# Patient Record
Sex: Female | Born: 1955
Health system: Southern US, Community
[De-identification: ages and names within clinical notes are randomized; demographics above are authoritative.]

## PROBLEM LIST (undated history)

## (undated) DIAGNOSIS — R002 Palpitations: Secondary | ICD-10-CM

## (undated) DIAGNOSIS — K449 Diaphragmatic hernia without obstruction or gangrene: Secondary | ICD-10-CM

## (undated) DIAGNOSIS — K219 Gastro-esophageal reflux disease without esophagitis: Secondary | ICD-10-CM

## (undated) DIAGNOSIS — A048 Other specified bacterial intestinal infections: Secondary | ICD-10-CM

## (undated) DIAGNOSIS — K224 Dyskinesia of esophagus: Secondary | ICD-10-CM

## (undated) DIAGNOSIS — K2950 Unspecified chronic gastritis without bleeding: Secondary | ICD-10-CM

## (undated) DIAGNOSIS — E042 Nontoxic multinodular goiter: Secondary | ICD-10-CM

## (undated) DIAGNOSIS — C649 Malignant neoplasm of unspecified kidney, except renal pelvis: Secondary | ICD-10-CM

## (undated) DIAGNOSIS — E041 Nontoxic single thyroid nodule: Secondary | ICD-10-CM

## (undated) DIAGNOSIS — M7580 Other shoulder lesions, unspecified shoulder: Secondary | ICD-10-CM

## (undated) DIAGNOSIS — M1990 Unspecified osteoarthritis, unspecified site: Secondary | ICD-10-CM

## (undated) DIAGNOSIS — IMO0002 Reserved for concepts with insufficient information to code with codable children: Secondary | ICD-10-CM

## (undated) DIAGNOSIS — S92502P Displaced unspecified fracture of left lesser toe(s), subsequent encounter for fracture with malunion: Secondary | ICD-10-CM

## (undated) DIAGNOSIS — I10 Essential (primary) hypertension: Secondary | ICD-10-CM

## (undated) DIAGNOSIS — E0590 Thyrotoxicosis, unspecified without thyrotoxic crisis or storm: Secondary | ICD-10-CM

## (undated) DIAGNOSIS — C801 Malignant (primary) neoplasm, unspecified: Secondary | ICD-10-CM

## (undated) DIAGNOSIS — M5030 Other cervical disc degeneration, unspecified cervical region: Secondary | ICD-10-CM

## (undated) HISTORY — DX: Other specified bacterial intestinal infections: A04.8

## (undated) HISTORY — DX: Essential (primary) hypertension: I10

## (undated) HISTORY — PX: TRANSTHORACIC ECHOCARDIOGRAM: SHX275

## (undated) HISTORY — PX: OTHER SURGICAL HISTORY: SHX169

## (undated) HISTORY — DX: Reserved for concepts with insufficient information to code with codable children: IMO0002

## (undated) HISTORY — DX: Thyrotoxicosis, unspecified without thyrotoxic crisis or storm: E05.90

## (undated) HISTORY — DX: Nontoxic single thyroid nodule: E04.1

## (undated) HISTORY — DX: Gastro-esophageal reflux disease without esophagitis: K21.9

## (undated) HISTORY — DX: Other shoulder lesions, unspecified shoulder: M75.80

## (undated) HISTORY — DX: Malignant (primary) neoplasm, unspecified: C80.1

## (undated) HISTORY — PX: TUBAL LIGATION: SHX77

## (undated) HISTORY — DX: Diaphragmatic hernia without obstruction or gangrene: K44.9

## (undated) HISTORY — DX: Malignant neoplasm of unspecified kidney, except renal pelvis: C64.9

## (undated) HISTORY — DX: Unspecified chronic gastritis without bleeding: K29.50

## (undated) HISTORY — DX: Unspecified osteoarthritis, unspecified site: M19.90

## (undated) HISTORY — DX: Nontoxic multinodular goiter: E04.2

## (undated) HISTORY — DX: Dyskinesia of esophagus: K22.4

---

## 1998-12-08 ENCOUNTER — Encounter: Payer: Self-pay | Admitting: *Deleted

## 1998-12-08 ENCOUNTER — Ambulatory Visit (HOSPITAL_COMMUNITY): Admission: RE | Admit: 1998-12-08 | Discharge: 1998-12-08 | Payer: Self-pay | Admitting: *Deleted

## 1999-12-30 ENCOUNTER — Encounter: Payer: Self-pay | Admitting: *Deleted

## 1999-12-30 ENCOUNTER — Ambulatory Visit (HOSPITAL_COMMUNITY): Admission: RE | Admit: 1999-12-30 | Discharge: 1999-12-30 | Payer: Self-pay | Admitting: *Deleted

## 2000-10-04 ENCOUNTER — Other Ambulatory Visit: Admission: RE | Admit: 2000-10-04 | Discharge: 2000-10-04 | Payer: Self-pay | Admitting: Gynecology

## 2001-11-08 ENCOUNTER — Other Ambulatory Visit: Admission: RE | Admit: 2001-11-08 | Discharge: 2001-11-08 | Payer: Self-pay | Admitting: Gynecology

## 2001-12-06 ENCOUNTER — Encounter: Payer: Self-pay | Admitting: Obstetrics and Gynecology

## 2001-12-06 ENCOUNTER — Ambulatory Visit (HOSPITAL_COMMUNITY): Admission: RE | Admit: 2001-12-06 | Discharge: 2001-12-06 | Payer: Self-pay | Admitting: Obstetrics and Gynecology

## 2003-08-05 ENCOUNTER — Encounter: Payer: Self-pay | Admitting: Obstetrics and Gynecology

## 2003-08-05 ENCOUNTER — Ambulatory Visit (HOSPITAL_COMMUNITY): Admission: RE | Admit: 2003-08-05 | Discharge: 2003-08-05 | Payer: Self-pay | Admitting: Obstetrics and Gynecology

## 2003-08-07 ENCOUNTER — Encounter: Payer: Self-pay | Admitting: Obstetrics and Gynecology

## 2003-08-07 ENCOUNTER — Encounter: Admission: RE | Admit: 2003-08-07 | Discharge: 2003-08-07 | Payer: Self-pay | Admitting: Obstetrics and Gynecology

## 2003-08-18 ENCOUNTER — Other Ambulatory Visit: Admission: RE | Admit: 2003-08-18 | Discharge: 2003-08-18 | Payer: Self-pay | Admitting: Gynecology

## 2003-12-20 HISTORY — PX: NEPHRECTOMY: SHX65

## 2004-01-05 ENCOUNTER — Ambulatory Visit (HOSPITAL_COMMUNITY): Admission: RE | Admit: 2004-01-05 | Discharge: 2004-01-05 | Payer: Self-pay | Admitting: Gastroenterology

## 2004-01-08 ENCOUNTER — Ambulatory Visit (HOSPITAL_COMMUNITY): Admission: RE | Admit: 2004-01-08 | Discharge: 2004-01-08 | Payer: Self-pay | Admitting: Gastroenterology

## 2004-01-20 ENCOUNTER — Ambulatory Visit (HOSPITAL_COMMUNITY): Admission: RE | Admit: 2004-01-20 | Discharge: 2004-01-20 | Payer: Self-pay | Admitting: Gastroenterology

## 2004-01-27 ENCOUNTER — Ambulatory Visit (HOSPITAL_COMMUNITY): Admission: RE | Admit: 2004-01-27 | Discharge: 2004-01-27 | Payer: Self-pay | Admitting: Gastroenterology

## 2004-01-28 ENCOUNTER — Ambulatory Visit (HOSPITAL_COMMUNITY): Admission: RE | Admit: 2004-01-28 | Discharge: 2004-01-28 | Payer: Self-pay | Admitting: *Deleted

## 2004-09-07 ENCOUNTER — Ambulatory Visit (HOSPITAL_COMMUNITY): Admission: RE | Admit: 2004-09-07 | Discharge: 2004-09-07 | Payer: Self-pay | Admitting: Obstetrics and Gynecology

## 2004-09-07 ENCOUNTER — Other Ambulatory Visit: Admission: RE | Admit: 2004-09-07 | Discharge: 2004-09-07 | Payer: Self-pay | Admitting: Gynecology

## 2005-09-19 ENCOUNTER — Other Ambulatory Visit: Admission: RE | Admit: 2005-09-19 | Discharge: 2005-09-19 | Payer: Self-pay | Admitting: Gynecology

## 2005-11-01 ENCOUNTER — Ambulatory Visit (HOSPITAL_COMMUNITY): Admission: RE | Admit: 2005-11-01 | Discharge: 2005-11-01 | Payer: Self-pay | Admitting: Gynecology

## 2006-11-06 ENCOUNTER — Ambulatory Visit (HOSPITAL_COMMUNITY): Admission: RE | Admit: 2006-11-06 | Discharge: 2006-11-06 | Payer: Self-pay | Admitting: Gynecology

## 2006-11-17 ENCOUNTER — Other Ambulatory Visit: Admission: RE | Admit: 2006-11-17 | Discharge: 2006-11-17 | Payer: Self-pay | Admitting: Gynecology

## 2007-11-16 ENCOUNTER — Ambulatory Visit (HOSPITAL_COMMUNITY): Admission: RE | Admit: 2007-11-16 | Discharge: 2007-11-16 | Payer: Self-pay | Admitting: Gynecology

## 2007-11-19 DIAGNOSIS — E559 Vitamin D deficiency, unspecified: Secondary | ICD-10-CM | POA: Insufficient documentation

## 2007-11-23 ENCOUNTER — Other Ambulatory Visit: Admission: RE | Admit: 2007-11-23 | Discharge: 2007-11-23 | Payer: Self-pay | Admitting: Gynecology

## 2008-12-01 ENCOUNTER — Ambulatory Visit (HOSPITAL_COMMUNITY): Admission: RE | Admit: 2008-12-01 | Discharge: 2008-12-01 | Payer: Self-pay | Admitting: Gynecology

## 2008-12-19 HISTORY — PX: ROTATOR CUFF REPAIR: SHX139

## 2009-08-14 ENCOUNTER — Ambulatory Visit: Payer: Self-pay | Admitting: Women's Health

## 2009-08-14 ENCOUNTER — Encounter: Payer: Self-pay | Admitting: Women's Health

## 2009-08-14 ENCOUNTER — Other Ambulatory Visit: Admission: RE | Admit: 2009-08-14 | Discharge: 2009-08-14 | Payer: Self-pay | Admitting: Gynecology

## 2009-08-20 ENCOUNTER — Ambulatory Visit: Payer: Self-pay | Admitting: Women's Health

## 2009-08-25 ENCOUNTER — Encounter: Payer: Self-pay | Admitting: Gynecology

## 2009-08-25 ENCOUNTER — Ambulatory Visit: Payer: Self-pay | Admitting: Gynecology

## 2010-01-28 ENCOUNTER — Ambulatory Visit (HOSPITAL_COMMUNITY): Admission: RE | Admit: 2010-01-28 | Discharge: 2010-01-28 | Payer: Self-pay | Admitting: Gynecology

## 2010-02-25 ENCOUNTER — Ambulatory Visit: Payer: Self-pay | Admitting: Women's Health

## 2010-02-25 ENCOUNTER — Other Ambulatory Visit: Admission: RE | Admit: 2010-02-25 | Discharge: 2010-02-25 | Payer: Self-pay | Admitting: Gynecology

## 2010-08-16 ENCOUNTER — Ambulatory Visit: Payer: Self-pay | Admitting: Women's Health

## 2010-08-16 ENCOUNTER — Other Ambulatory Visit: Admission: RE | Admit: 2010-08-16 | Discharge: 2010-08-16 | Payer: Self-pay | Admitting: Gynecology

## 2010-12-19 HISTORY — PX: FOOT SURGERY: SHX648

## 2011-01-19 DIAGNOSIS — IMO0001 Reserved for inherently not codable concepts without codable children: Secondary | ICD-10-CM | POA: Insufficient documentation

## 2011-01-28 ENCOUNTER — Ambulatory Visit: Payer: Self-pay | Admitting: Women's Health

## 2011-01-28 ENCOUNTER — Ambulatory Visit: Payer: Commercial Managed Care - PPO | Admitting: Women's Health

## 2011-01-28 ENCOUNTER — Other Ambulatory Visit: Payer: Self-pay | Admitting: Women's Health

## 2011-01-28 ENCOUNTER — Other Ambulatory Visit (HOSPITAL_COMMUNITY)
Admission: RE | Admit: 2011-01-28 | Discharge: 2011-01-28 | Disposition: A | Discharge status: Home / Self Care | Payer: Commercial Managed Care - PPO | Source: Ambulatory Visit | Attending: Gynecology | Admitting: Gynecology

## 2011-01-28 DIAGNOSIS — R87619 Unspecified abnormal cytological findings in specimens from cervix uteri: Secondary | ICD-10-CM | POA: Insufficient documentation

## 2011-01-28 DIAGNOSIS — R87810 Cervical high risk human papillomavirus (HPV) DNA test positive: Secondary | ICD-10-CM | POA: Insufficient documentation

## 2011-01-28 DIAGNOSIS — N870 Mild cervical dysplasia: Secondary | ICD-10-CM

## 2011-02-10 ENCOUNTER — Other Ambulatory Visit: Payer: Self-pay | Admitting: Gynecology

## 2011-02-10 DIAGNOSIS — Z1231 Encounter for screening mammogram for malignant neoplasm of breast: Secondary | ICD-10-CM

## 2011-02-11 ENCOUNTER — Ambulatory Visit (HOSPITAL_COMMUNITY)
Admission: RE | Admit: 2011-02-11 | Discharge: 2011-02-11 | Disposition: A | Discharge status: Home / Self Care | Payer: Commercial Managed Care - PPO | Source: Ambulatory Visit | Attending: Gynecology | Admitting: Gynecology

## 2011-02-11 ENCOUNTER — Other Ambulatory Visit: Payer: Self-pay | Admitting: Gynecology

## 2011-02-11 ENCOUNTER — Ambulatory Visit (INDEPENDENT_AMBULATORY_CARE_PROVIDER_SITE_OTHER): Payer: Commercial Managed Care - PPO | Admitting: Gynecology

## 2011-02-11 ENCOUNTER — Encounter (HOSPITAL_COMMUNITY): Payer: Self-pay

## 2011-02-11 DIAGNOSIS — Z1231 Encounter for screening mammogram for malignant neoplasm of breast: Secondary | ICD-10-CM | POA: Insufficient documentation

## 2011-02-11 DIAGNOSIS — R87810 Cervical high risk human papillomavirus (HPV) DNA test positive: Secondary | ICD-10-CM

## 2011-05-06 NOTE — Op Note (Signed)
NAME:  [name], [name]                         ACCOUNT NO.:  [id number]   MEDICAL RECORD NO.:  [id number]                   PATIENT TYPE:  AMB   LOCATION:  ENDO                                 FACILITY:  [facility]   PHYSICIAN:  Anselmo Rod, M.D.               DATE OF BIRTH:  1955/12/21   DATE OF PROCEDURE:  01/05/2004  DATE OF DISCHARGE:                                 OPERATIVE REPORT   ADDENDUM:  The patient has a small hiatal hernia measuring 3 to 4 cm seen on  high retroflection.                                               Anselmo Rod, M.D.    JNM/MEDQ  D:  01/05/2004  T:  01/05/2004  Job:  [id number]

## 2011-05-06 NOTE — Op Note (Signed)
NAME:  Cynthia Berger, Cynthia Berger                         ACCOUNT NO.:  [id number]   MEDICAL RECORD NO.:  [id number]                   PATIENT TYPE:  AMB   LOCATION:  ENDO                                 FACILITY:  [facility]   PHYSICIAN:  Anselmo Rod, M.D.               DATE OF BIRTH:  [birth year]   DATE OF PROCEDURE:  01/05/2004  DATE OF DISCHARGE:                                 OPERATIVE REPORT   PROCEDURE PERFORMED:  Esophagogastroduodenoscopy.   ENDOSCOPIST:  Charna Elizabeth, M.D.   INSTRUMENT USED:  Olympus video panendoscope.   INDICATIONS FOR PROCEDURE:  The patient is a 55 year old African-American  female presenting with epigastric pain and retrosternal burning not  responding to the use of proton pump inhibitors.  Rule out peptic ulcer  disease, esophagitis, gastritis, etc.   PREPROCEDURE PREPARATION:  Informed consent was procured from the patient.  The patient was fasted for eight hours prior to the procedure.   PREPROCEDURE PHYSICAL:  The patient had stable vital signs.  Neck supple,  chest clear to auscultation.  S1, S2 regular.  Abdomen soft with normal  bowel sounds.   DESCRIPTION OF PROCEDURE:  The patient was placed in the left lateral  decubitus position and sedated with 75 mg of Demerol and 6 mg of Versed  intravenously.  Once the patient was adequately sedated and maintained on  low-flow oxygen and continuous cardiac monitoring, the Olympus video  panendoscope was advanced through the mouth piece over the tongue into the  esophagus under direct vision.  The entire esophagus appeared normal with no  evidence of ring, stricture, masses, esophagitis or Barrett's mucosa.  The  scope was then advanced to the stomach.  Except for mild antral gastritis,  no other abnormalities were noted.  The proximal small bowel appeared  normal.  There was no outlet obstruction. The patient tolerated the  procedure well without complications.   IMPRESSION:  1.  Mild antral gastritis.  2.   3 to 4 cm hiatal hernia seen on retroflection.   RECOMMENDATIONS:  1. Schedule abdominal ultrasound and HIDA scan to rule out gallbladder     disease.  HIDA scan should be done with CCK injection to rule out biliary     dyskinesia.  2. Schedule 24 pH study with manometry to further evaluate patient's     symptoms.  The patient had recent Helicobacter pylori serology positive     treated with antibiotics and therefore repeat biopsies for Helicobacter     pylori not done.  3. Outpatient follow-up after the above mentioned test results had been     procured.  4. Continue PPIs, follow antireflux measures.                                               Jyothi Nat  Loreta Ave, M.D.    JNM/MEDQ  D:  01/05/2004  T:  01/05/2004  Job:  [id number]   cc:   Gabriel Earing, M.D.  [address]  [place]  [place] [address]  Fax: [phone]

## 2011-07-21 ENCOUNTER — Emergency Department (HOSPITAL_COMMUNITY): Payer: Commercial Managed Care - PPO

## 2011-07-21 ENCOUNTER — Emergency Department (HOSPITAL_COMMUNITY)
Admission: EM | Admit: 2011-07-21 | Discharge: 2011-07-21 | Disposition: A | Discharge status: Home / Self Care | Payer: Commercial Managed Care - PPO | Attending: Emergency Medicine | Admitting: Emergency Medicine

## 2011-07-21 DIAGNOSIS — R5381 Other malaise: Secondary | ICD-10-CM | POA: Insufficient documentation

## 2011-07-21 DIAGNOSIS — R5383 Other fatigue: Secondary | ICD-10-CM | POA: Insufficient documentation

## 2011-07-21 DIAGNOSIS — R42 Dizziness and giddiness: Secondary | ICD-10-CM | POA: Insufficient documentation

## 2011-07-21 DIAGNOSIS — Z85528 Personal history of other malignant neoplasm of kidney: Secondary | ICD-10-CM | POA: Insufficient documentation

## 2011-07-21 DIAGNOSIS — R002 Palpitations: Secondary | ICD-10-CM | POA: Insufficient documentation

## 2011-07-21 LAB — COMPREHENSIVE METABOLIC PANEL
ALT: 16 U/L (ref 0–35)
AST: 26 U/L (ref 0–37)
Albumin: 3.6 g/dL (ref 3.5–5.2)
Alkaline Phosphatase: 79 U/L (ref 39–117)
Calcium: 9.5 mg/dL (ref 8.4–10.5)
Potassium: 4.1 mEq/L (ref 3.5–5.1)
Sodium: 135 mEq/L (ref 135–145)
Total Protein: 7.6 g/dL (ref 6.0–8.3)

## 2011-07-21 LAB — DIFFERENTIAL
Basophils Absolute: 0 10*3/uL (ref 0.0–0.1)
Basophils Relative: 1 % (ref 0–1)
Lymphocytes Relative: 19 % (ref 12–46)
Monocytes Absolute: 0.6 10*3/uL (ref 0.1–1.0)
Neutro Abs: 6.2 10*3/uL (ref 1.7–7.7)
Neutrophils Relative %: 72 % (ref 43–77)

## 2011-07-21 LAB — CBC
HCT: 40 % (ref 36.0–46.0)
Hemoglobin: 13.1 g/dL (ref 12.0–15.0)
MCHC: 32.8 g/dL (ref 30.0–36.0)
RBC: 4.47 MIL/uL (ref 3.87–5.11)
WBC: 8.5 10*3/uL (ref 4.0–10.5)

## 2011-07-21 LAB — TROPONIN I: Troponin I: <0.3 ng/mL (ref <0.30)

## 2011-07-21 LAB — CK TOTAL AND CKMB (NOT AT ARMC)
CK, MB: 3 ng/mL (ref 0.3–4.0)
Relative Index: 1.1 (ref 0.0–2.5)
Total CK: 263 U/L — ABNORMAL HIGH (ref 7–177)

## 2011-07-21 LAB — URINALYSIS, ROUTINE W REFLEX MICROSCOPIC
Hgb urine dipstick: NEGATIVE
Protein, ur: NEGATIVE mg/dL
Urobilinogen, UA: 0.2 mg/dL (ref 0.0–1.0)

## 2011-08-16 DIAGNOSIS — M7580 Other shoulder lesions, unspecified shoulder: Secondary | ICD-10-CM | POA: Insufficient documentation

## 2011-08-16 DIAGNOSIS — Z85528 Personal history of other malignant neoplasm of kidney: Secondary | ICD-10-CM | POA: Insufficient documentation

## 2011-08-16 DIAGNOSIS — Z634 Disappearance and death of family member: Secondary | ICD-10-CM | POA: Insufficient documentation

## 2011-08-19 ENCOUNTER — Emergency Department (HOSPITAL_COMMUNITY)
Admission: EM | Admit: 2011-08-19 | Discharge: 2011-08-19 | Disposition: A | Discharge status: Home / Self Care | Payer: Commercial Managed Care - PPO | Attending: Emergency Medicine | Admitting: Emergency Medicine

## 2011-08-19 DIAGNOSIS — X58XXXA Exposure to other specified factors, initial encounter: Secondary | ICD-10-CM | POA: Insufficient documentation

## 2011-08-19 DIAGNOSIS — Y9269 Other specified industrial and construction area as the place of occurrence of the external cause: Secondary | ICD-10-CM | POA: Insufficient documentation

## 2011-08-19 DIAGNOSIS — S058X9A Other injuries of unspecified eye and orbit, initial encounter: Secondary | ICD-10-CM | POA: Insufficient documentation

## 2011-08-19 DIAGNOSIS — Y990 Civilian activity done for income or pay: Secondary | ICD-10-CM | POA: Insufficient documentation

## 2011-08-24 ENCOUNTER — Encounter: Payer: Self-pay | Admitting: Women's Health

## 2011-08-24 ENCOUNTER — Ambulatory Visit (INDEPENDENT_AMBULATORY_CARE_PROVIDER_SITE_OTHER): Payer: Commercial Managed Care - PPO | Admitting: Women's Health

## 2011-08-24 ENCOUNTER — Other Ambulatory Visit (HOSPITAL_COMMUNITY)
Admission: RE | Admit: 2011-08-24 | Discharge: 2011-08-24 | Disposition: A | Discharge status: Home / Self Care | Payer: Commercial Managed Care - PPO | Source: Ambulatory Visit | Attending: Obstetrics and Gynecology | Admitting: Obstetrics and Gynecology

## 2011-08-24 VITALS — BP 130/70 | Ht 63.0 in | Wt 146.0 lb

## 2011-08-24 DIAGNOSIS — R42 Dizziness and giddiness: Secondary | ICD-10-CM

## 2011-08-24 DIAGNOSIS — Z833 Family history of diabetes mellitus: Secondary | ICD-10-CM

## 2011-08-24 DIAGNOSIS — Z01419 Encounter for gynecological examination (general) (routine) without abnormal findings: Secondary | ICD-10-CM | POA: Insufficient documentation

## 2011-08-24 DIAGNOSIS — R82998 Other abnormal findings in urine: Secondary | ICD-10-CM

## 2011-08-24 DIAGNOSIS — Z780 Asymptomatic menopausal state: Secondary | ICD-10-CM

## 2011-08-24 NOTE — Progress Notes (Signed)
BAYLI QUESINBERRY 1956/11/26 [id number]    History:    The patient presents for annual exam.  Has had several episodes of vertigo, no syncope.Had normal cbc and glucose and cardiac workup about a month ago. Hx of kidney cancer, she has had normal checkups with her urologist in [place]. Normal colonoscopy in 2008.   Past medical history, past surgical history, family history and social history were all reviewed and documented in the EPIC chart.   ROS:  A  ROS was performed and pertinent positives and negatives are included in the history.  Exam:  Filed Vitals:   08/24/11 1511  BP: 130/70    General appearance:  Normal Head/Neck:  Normal, without cervical or supraclavicular adenopathy. Thyroid:  Symmetrical, normal in size, without palpable masses or nodularity. Respiratory  Effort:  Normal  Auscultation:  Clear without wheezing or rhonchi Cardiovascular  Auscultation:  Regular rate, without rubs, murmurs or gallops  Edema/varicosities:  Not grossly evident Abdominal  Soft,nontender, without masses, guarding or rebound.  Liver/spleen:  No organomegaly noted  Hernia:  None appreciated  Skin  Inspection:  Grossly normal  Palpation:  Grossly normal Neurologic/psychiatric  Orientation:  Normal with appropriate conversation.  Mood/affect:  Normal  Genitourinary    Breasts: Examined lying and sitting.     Right: Without masses, retractions, discharge or axillary adenopathy.     Left: Without masses, retractions, discharge or axillary adenopathy.   Inguinal/mons:  Normal without inguinal adenopathy  External genitalia:  Normal  BUS/Urethra/Skene's glands:  Normal  Bladder:  Normal  Vagina:  Normal  Cervix:  Normal  Uterus:  retroverted, fibroids, 12 wk size.  Midline and mobile  Adnexa/parametria:     Rt: Without masses or tenderness.   Lt: Without masses or tenderness.  Anus and perineum: Normal  Digital rectal exam: Normal sphincter tone without palpated masses or  tenderness  Assessment/Plan:  55 y.o. WBF G6P4  for annual exam. Postmenopausal on no HRT and no bleeding. She had a normal DEXA in 09 she will repeat that this year, will get that scheduled here in our office. She has had a Pap with a low grade change with positive high-risk HPV, she is aware the importance of followup and 4 normal Paps to then proceed to yearly. Last Pap in February 2012 was ascus. SBEs, annual mammogram, normal in February of 2012. Will check a TSH, UA, Pap she did have a normal glucose and CBC about a month ago.    Harrington Challenger [facility], 3:55 PM 08/24/2011

## 2011-08-24 NOTE — Progress Notes (Signed)
Addended by: Cammie Mcgee T on: 08/24/2011 04:34 PM   Modules accepted: Orders

## 2011-08-26 ENCOUNTER — Other Ambulatory Visit: Payer: Self-pay

## 2011-08-26 DIAGNOSIS — N390 Urinary tract infection, site not specified: Secondary | ICD-10-CM

## 2011-08-26 MED ORDER — NITROFURANTOIN MONOHYD MACRO 100 MG PO CAPS: 100.0000 mg | ORAL_CAPSULE | Freq: Two times a day (BID) | ORAL | Status: AC

## 2011-08-29 ENCOUNTER — Telehealth: Payer: Self-pay

## 2011-08-29 DIAGNOSIS — N390 Urinary tract infection, site not specified: Secondary | ICD-10-CM

## 2011-08-29 MED ORDER — FLUCONAZOLE 150 MG PO TABS: 150.0000 mg | ORAL_TABLET | Freq: Every day | ORAL | Status: AC

## 2011-08-29 NOTE — Telephone Encounter (Signed)
PT. NOTIFIED OF Cynthia Berger'S NOTE BELOW. SENT RX TO PHARMACY & RECK ORDER IN [facility]

## 2011-08-29 NOTE — Telephone Encounter (Signed)
Message copied by Venora Maples on Mon Aug 29, 2011  5:27 PM ------      Message from: [place], [place] J      Created: Mon Aug 29, 2011  1:19 PM       Stetson Pelaez, please call patient and inform her Pap was normal but positive for yeast. Diflucan 150 by mouth x1 dose with refill. Also instructed her to return to the office for a test of cure her urine one week after finishing antibiotic/macrobid. Thanks

## 2011-10-05 ENCOUNTER — Other Ambulatory Visit: Payer: Self-pay | Admitting: Gynecology

## 2011-10-05 ENCOUNTER — Ambulatory Visit (INDEPENDENT_AMBULATORY_CARE_PROVIDER_SITE_OTHER): Admission: RE | Admit: 2011-10-05 | Payer: Commercial Managed Care - PPO | Source: Ambulatory Visit

## 2011-10-05 ENCOUNTER — Other Ambulatory Visit (INDEPENDENT_AMBULATORY_CARE_PROVIDER_SITE_OTHER): Payer: Commercial Managed Care - PPO | Admitting: *Deleted

## 2011-10-05 ENCOUNTER — Other Ambulatory Visit: Payer: Self-pay | Admitting: *Deleted

## 2011-10-05 DIAGNOSIS — N390 Urinary tract infection, site not specified: Secondary | ICD-10-CM

## 2011-10-05 DIAGNOSIS — M899 Disorder of bone, unspecified: Secondary | ICD-10-CM

## 2011-10-05 DIAGNOSIS — M949 Disorder of cartilage, unspecified: Secondary | ICD-10-CM

## 2011-10-05 DIAGNOSIS — M8580 Other specified disorders of bone density and structure, unspecified site: Secondary | ICD-10-CM

## 2011-10-05 DIAGNOSIS — Z5189 Encounter for other specified aftercare: Secondary | ICD-10-CM

## 2011-10-05 DIAGNOSIS — R82998 Other abnormal findings in urine: Secondary | ICD-10-CM

## 2011-10-06 ENCOUNTER — Emergency Department (HOSPITAL_COMMUNITY)
Admission: EM | Admit: 2011-10-06 | Discharge: 2011-10-07 | Disposition: A | Discharge status: Home / Self Care | Payer: Commercial Managed Care - PPO | Attending: Emergency Medicine | Admitting: Emergency Medicine

## 2011-10-06 DIAGNOSIS — Z905 Acquired absence of kidney: Secondary | ICD-10-CM | POA: Insufficient documentation

## 2011-10-06 DIAGNOSIS — Z85528 Personal history of other malignant neoplasm of kidney: Secondary | ICD-10-CM | POA: Insufficient documentation

## 2011-10-06 DIAGNOSIS — N289 Disorder of kidney and ureter, unspecified: Secondary | ICD-10-CM | POA: Insufficient documentation

## 2011-10-06 DIAGNOSIS — R109 Unspecified abdominal pain: Secondary | ICD-10-CM | POA: Insufficient documentation

## 2011-10-06 DIAGNOSIS — R197 Diarrhea, unspecified: Secondary | ICD-10-CM | POA: Insufficient documentation

## 2011-10-06 LAB — POCT I-STAT, CHEM 8
Calcium, Ion: 1.18 mmol/L (ref 1.12–1.32)
Creatinine, Ser: 1.5 mg/dL — ABNORMAL HIGH (ref 0.50–1.10)
Glucose, Bld: 88 mg/dL (ref 70–99)
HCT: 48 % — ABNORMAL HIGH (ref 36.0–46.0)
Hemoglobin: 16.3 g/dL — ABNORMAL HIGH (ref 12.0–15.0)
Potassium: 3.7 mEq/L (ref 3.5–5.1)
TCO2: 25 mmol/L (ref 0–100)

## 2011-10-07 LAB — POCT I-STAT, CHEM 8
Calcium, Ion: 1.22 mmol/L (ref 1.12–1.32)
Glucose, Bld: 88 mg/dL (ref 70–99)
HCT: 48 % — ABNORMAL HIGH (ref 36.0–46.0)
Hemoglobin: 16.3 g/dL — ABNORMAL HIGH (ref 12.0–15.0)
TCO2: 25 mmol/L (ref 0–100)

## 2011-10-11 ENCOUNTER — Other Ambulatory Visit: Payer: Commercial Managed Care - PPO | Admitting: *Deleted

## 2011-10-11 DIAGNOSIS — M8580 Other specified disorders of bone density and structure, unspecified site: Secondary | ICD-10-CM

## 2011-10-12 LAB — PTH, INTACT AND CALCIUM: PTH: 36.4 pg/mL (ref 14.0–72.0)

## 2011-10-13 ENCOUNTER — Encounter: Payer: Self-pay | Admitting: Gynecology

## 2011-10-17 ENCOUNTER — Telehealth: Payer: Self-pay | Admitting: Women's Health

## 2011-10-17 NOTE — Telephone Encounter (Signed)
Telephone call to discuss recent ER visit on 10/27 for severe abdominal pain and diarrhea. Significant history kidney cancer in 05. Reviewed importance to followup with Dr. Loreta Ave, will schedule an appointment.

## 2011-10-19 ENCOUNTER — Encounter: Payer: Self-pay | Admitting: Internal Medicine

## 2011-10-19 ENCOUNTER — Ambulatory Visit (INDEPENDENT_AMBULATORY_CARE_PROVIDER_SITE_OTHER): Payer: Commercial Managed Care - PPO | Admitting: Internal Medicine

## 2011-10-19 VITALS — BP 122/62 | HR 78 | Ht 63.0 in | Wt 144.0 lb

## 2011-10-19 DIAGNOSIS — R197 Diarrhea, unspecified: Secondary | ICD-10-CM

## 2011-10-19 MED ORDER — METRONIDAZOLE 250 MG PO TABS: ORAL_TABLET | ORAL | Status: DC

## 2011-10-19 NOTE — Patient Instructions (Signed)
We have sent the following medications to your pharmacy for you to pick up at your convenience: Flagyl 250 mg four times daily x 10 days. Your physician has requested that you go to the basement for the following lab work before leaving today: Stool Culture, Stool for lactoferrin

## 2011-10-19 NOTE — Progress Notes (Signed)
Cynthia Berger 1956/10/06 MRN [id number]   History of Present Illness:  This is a 55 year old African American female with an acute diarrheal illness that started about 3 weeks ago after she took a course probiotics. Her usual bowel habits are one bowel movement every 3-4 days. She is now having 6-8 bowel movements which were initially watery but are currently are soft and runny and she has at least 1-2 every night . There has been no fever. She is having crampy abdominal pain. She has been on liquid food. She has lost several pounds. She has been able to work however. There is no history of gastrointestinal problems. She underwent a screening colonoscopy by Dr. Loreta Ave about 4 years ago at the age of 55. The exam was normal. Evaluation in the emergency room showed C. difficile negative stool.   Past Medical History  Diagnosis Date  . Fibroid     ASYMPTOMATIC  . 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 ASCUS (atypical squamous cells of undetermined significance) on Pap smear 01/2011    C&B -INADEQUATE ECC  . Vitamin D deficiency 11/2007    LOW AT 17  . AC (acromioclavicular) joint bone spurs     HEEL BONE SPURS-SURGERY 12/2010 LEFT FOOT  . History of kidney cancer 2004-05-22  . Death of family member     HUSBAND DIED IN 05/22/2008  . Antral gastritis   . Hiatal hernia   . Positive H. pylori test   . Uterine fibroid   . GERD (gastroesophageal reflux disease)    Past Surgical History  Procedure Date  . Cesarean section 1993     BTL AT THAT TIME  . Tubal ligation 1993     AT TIME OF C/S  . Nephrectomy 2005    KIDNEY CANCER  . Rotator cuff repair 2010    right  . Kidney removed 2004-05-22    right kidney  . Foot surgery 23-May-2011    left heel spur removed    reports that she has never smoked. She has never used smokeless tobacco. She reports that she drinks alcohol. She reports that she does not use illicit drugs. family history  includes Hypertension in her mother, sister, and son. Allergies  Allergen Reactions  . Penicillins         Review of heartburn and reflux as has flared up since the diarrhea started. No fever. No chest pain or shortness of breath  The remainder of the 10 point ROS is negative except as outlined in H&P   Physical Exam: General appearance  Well developed, in no distress. Eyes- non icteric. HEENT nontraumatic, normocephalic. Mouth no lesions, tongue papillated, no cheilosis. Neck supple without adenopathy, thyroid not enlarged, no carotid bruits, no JVD. Lungs Clear to auscultation bilaterally. Cor normal S1, normal S2, regular rhythm, no murmur,  quiet precordium. Abdomen: Soft abdomen, nontender, normoactive bowel sounds. No distention. No palpable mass. Rectal: Soft Hemoccult negative stool. Extremities no pedal edema. Skin no lesions. Neurological alert and oriented x 3. Psychological normal mood and affect.  Assessment and Plan:  Problem #1 Acute diarrheal illness, most likely infectious. She took antibiotics for a urinary tract infection. We will start her on Flagyl 250 mg 4 times a day for 10 days. We will obtain a stool for lactoferrin and culture. This would be less likely for new-onset inflammatory bowel disease. She will call us within 2 weeks if her symptoms do not improve.  She will stay on liquids and a low-residue diet. She will take Benadryl 20 mg twice a day for cramps. If symptoms continue, we will consider a colonoscopy with random biopsies.   10/19/2011 Cynthia Berger

## 2011-10-21 ENCOUNTER — Telehealth: Payer: Self-pay | Admitting: Internal Medicine

## 2011-10-21 ENCOUNTER — Other Ambulatory Visit: Payer: Commercial Managed Care - PPO

## 2011-10-21 DIAGNOSIS — R197 Diarrhea, unspecified: Secondary | ICD-10-CM

## 2011-10-21 MED ORDER — DICYCLOMINE HCL 20 MG PO TABS: 20.0000 mg | ORAL_TABLET | Freq: Two times a day (BID) | ORAL | Status: DC

## 2011-10-21 NOTE — Telephone Encounter (Signed)
rx for bentyl sent 

## 2011-10-24 ENCOUNTER — Telehealth: Payer: Self-pay | Admitting: *Deleted

## 2011-10-24 NOTE — Telephone Encounter (Signed)
Spoke with patient and gave her the results as per Dr. Brodie. 

## 2011-10-24 NOTE — Telephone Encounter (Signed)
Left a message for patient to call me. 

## 2011-10-24 NOTE — Telephone Encounter (Signed)
Message copied by Daphine Deutscher on Mon Oct 24, 2011  9:22 AM ------      Message from: Hart Carwin      Created: Sat Oct 22, 2011  1:13 PM       Please call pt, stool study  Consistent with colitis, continue Flagyl as prescribed.

## 2011-10-25 ENCOUNTER — Telehealth: Payer: Self-pay | Admitting: *Deleted

## 2011-10-25 LAB — STOOL CULTURE

## 2011-10-25 NOTE — Telephone Encounter (Signed)
Message copied by Daphine Deutscher on Tue Oct 25, 2011 11:17 AM ------      Message from: Hart Carwin      Created: Mon Oct 24, 2011  9:45 PM       Please call pt with negative stool cultures but positive lactoferrin- may be a colitis

## 2011-10-25 NOTE — Telephone Encounter (Signed)
error 

## 2011-10-25 NOTE — Telephone Encounter (Signed)
Left a message for patient to call me. 

## 2011-10-25 NOTE — Telephone Encounter (Signed)
Spoke with patient and gave her the results as per Dr. Brodie. 

## 2011-11-03 ENCOUNTER — Telehealth: Payer: Self-pay | Admitting: Gastroenterology

## 2011-11-03 ENCOUNTER — Telehealth: Payer: Self-pay | Admitting: Internal Medicine

## 2011-11-03 MED ORDER — ESOMEPRAZOLE MAGNESIUM 40 MG PO CPDR: 40.0000 mg | DELAYED_RELEASE_CAPSULE | Freq: Every day | ORAL | Status: DC

## 2011-11-03 NOTE — Telephone Encounter (Signed)
Rx sent to patient's pharmacy and she has been advised. Patient states that her diarrhea has gotten much better and she currently has no symptoms.

## 2011-11-03 NOTE — Telephone Encounter (Signed)
GERD is listed as a problem. OK to send prescription for a PPI. I AM ALSO INTERESTED HOW IS HER DIARRHEA, APPARENTLY NOT A PROBLEM??

## 2011-11-03 NOTE — Telephone Encounter (Signed)
Dr Juanda Chance- Would you like me to send rx for Nexium? From what I can tell, we have only seen her on 1 occasion and at that time there were no complaints regarding GERD. Apparently she has been getting prilosec OTC or from another prescriber previously.

## 2011-11-03 NOTE — Telephone Encounter (Signed)
error 

## 2011-11-03 NOTE — Telephone Encounter (Signed)
Left message for patient to call back  

## 2011-12-20 DIAGNOSIS — IMO0002 Reserved for concepts with insufficient information to code with codable children: Secondary | ICD-10-CM

## 2011-12-20 HISTORY — DX: Reserved for concepts with insufficient information to code with codable children: IMO0002

## 2012-02-15 ENCOUNTER — Other Ambulatory Visit: Payer: Self-pay | Admitting: Gynecology

## 2012-02-15 DIAGNOSIS — Z1231 Encounter for screening mammogram for malignant neoplasm of breast: Secondary | ICD-10-CM

## 2012-02-23 ENCOUNTER — Ambulatory Visit (INDEPENDENT_AMBULATORY_CARE_PROVIDER_SITE_OTHER): Payer: Commercial Managed Care - PPO | Admitting: Women's Health

## 2012-02-23 ENCOUNTER — Other Ambulatory Visit (HOSPITAL_COMMUNITY)
Admission: RE | Admit: 2012-02-23 | Discharge: 2012-02-23 | Disposition: A | Discharge status: Home / Self Care | Payer: Commercial Managed Care - PPO | Source: Ambulatory Visit | Attending: Obstetrics and Gynecology | Admitting: Obstetrics and Gynecology

## 2012-02-23 ENCOUNTER — Encounter: Payer: Self-pay | Admitting: Women's Health

## 2012-02-23 VITALS — BP 178/94

## 2012-02-23 DIAGNOSIS — I10 Essential (primary) hypertension: Secondary | ICD-10-CM | POA: Insufficient documentation

## 2012-02-23 DIAGNOSIS — R87610 Atypical squamous cells of undetermined significance on cytologic smear of cervix (ASC-US): Secondary | ICD-10-CM

## 2012-02-23 DIAGNOSIS — Z01419 Encounter for gynecological examination (general) (routine) without abnormal findings: Secondary | ICD-10-CM | POA: Insufficient documentation

## 2012-02-23 NOTE — Patient Instructions (Signed)

## 2012-02-23 NOTE — Progress Notes (Signed)
Patient ID: Cynthia Berger, female   DOB: [birth year], 56 y.o.   MRN: [id number] Presents for Pap. History of LGSIL, positive HR HPV 2010, 2011, 2012. Colposcopy 02/11/11 was inadequate, Dr. Audie Box had discussed every 6 month Pap to watch for progression or to proceed with a LEEP, patient chose to proceed with every 6 month Paps. Postmenopausal with occasional spotting only with intercourse. History of kidney cancer, nephrectomy in 2005. BP 178/94  Exam: External genitalia within normal limits, speculum exam cervix is pink without lesion Pap taken no bleeding noted vaginal atrophy present. Bimanual uterus bulky, enlarged, nontender history of fibroids.  Persistent LGSIL/positive HR HPV Hypertension  Plan: Instructed to return to office if any spotting not associated with intercourse. Encouraged vaginal lubricants with intercourse. If Pap stable or better return to office in 6 months for annual exam. Will triage based on pap results. Blood pressure elevated today 178/94. Reviewed hazards of elevated blood pressure with health especially related to 1 kidney. Instructed to schedule followup with primary care, has been seen at [facility] urgent care and will schedule followup care. Has numerous family members with hypertension. Has had normal blood pressures here but states did have an  elevated blood pressure at nephrologist in December.

## 2012-02-24 ENCOUNTER — Telehealth: Payer: Self-pay | Admitting: Women's Health

## 2012-02-24 DIAGNOSIS — N950 Postmenopausal bleeding: Secondary | ICD-10-CM

## 2012-02-24 NOTE — Telephone Encounter (Signed)
Message copied by Harrington Challenger on Fri Feb 24, 2012 10:24 AM ------      Message from: Dara Lords      Created: Fri Feb 24, 2012  8:56 AM       Harriett Sine, I would recommend a sonohysterogram due to her spotting after intercourse. Sometimes polyp/other pathology will bleed only after being shaken with intercourse. I think the safest thing a postmenopausal patient would be to get a baseline assessment of the endometrial cavity.

## 2012-02-24 NOTE — Telephone Encounter (Signed)
Message left to call office

## 2012-02-24 NOTE — Telephone Encounter (Signed)
Message copied by Harrington Challenger on Fri Feb 24, 2012 10:42 AM ------      Message from: Dara Lords      Created: Fri Feb 24, 2012  8:56 AM       Harriett Sine, I would recommend a sonohysterogram due to her spotting after intercourse. Sometimes polyp/other pathology will bleed only after being shaken with intercourse. I think the safest thing a postmenopausal patient would be to get a baseline assessment of the endometrial cavity.

## 2012-02-24 NOTE — Telephone Encounter (Signed)
Telephone call, reviewed Dr.Fontaine's recommendation to have a sonohysterogram due to this spotting with intercourse. Switched to appointments to schedule.

## 2012-03-07 ENCOUNTER — Ambulatory Visit (INDEPENDENT_AMBULATORY_CARE_PROVIDER_SITE_OTHER): Payer: Commercial Managed Care - PPO | Admitting: Internal Medicine

## 2012-03-07 ENCOUNTER — Encounter: Payer: Self-pay | Admitting: Internal Medicine

## 2012-03-07 VITALS — BP 132/92 | HR 72 | Temp 98.2°F | Ht 63.0 in | Wt 157.0 lb

## 2012-03-07 DIAGNOSIS — E049 Nontoxic goiter, unspecified: Secondary | ICD-10-CM

## 2012-03-07 DIAGNOSIS — E010 Iodine-deficiency related diffuse (endemic) goiter: Secondary | ICD-10-CM

## 2012-03-07 DIAGNOSIS — IMO0002 Reserved for concepts with insufficient information to code with codable children: Secondary | ICD-10-CM | POA: Insufficient documentation

## 2012-03-07 DIAGNOSIS — Q600 Renal agenesis, unilateral: Secondary | ICD-10-CM | POA: Insufficient documentation

## 2012-03-07 DIAGNOSIS — Q602 Renal agenesis, unspecified: Secondary | ICD-10-CM

## 2012-03-07 DIAGNOSIS — R002 Palpitations: Secondary | ICD-10-CM | POA: Insufficient documentation

## 2012-03-07 DIAGNOSIS — R0989 Other specified symptoms and signs involving the circulatory and respiratory systems: Secondary | ICD-10-CM | POA: Insufficient documentation

## 2012-03-07 DIAGNOSIS — I10 Essential (primary) hypertension: Secondary | ICD-10-CM

## 2012-03-07 LAB — BASIC METABOLIC PANEL
BUN: 16 mg/dL (ref 6–23)
CO2: 29 mEq/L (ref 19–32)
Chloride: 103 mEq/L (ref 96–112)
Creatinine, Ser: 1.3 mg/dL — ABNORMAL HIGH (ref 0.4–1.2)
Glucose, Bld: 128 mg/dL — ABNORMAL HIGH (ref 70–99)
Potassium: 4.7 mEq/L (ref 3.5–5.1)

## 2012-03-07 LAB — CBC WITH DIFFERENTIAL/PLATELET
Basophils Relative: 0.7 % (ref 0.0–3.0)
Eosinophils Absolute: 0.3 10*3/uL (ref 0.0–0.7)
Eosinophils Relative: 4.4 % (ref 0.0–5.0)
HCT: 40.4 % (ref 36.0–46.0)
Lymphs Abs: 2.5 10*3/uL (ref 0.7–4.0)
MCHC: 33.4 g/dL (ref 30.0–36.0)
MCV: 91.3 fl (ref 78.0–100.0)
Monocytes Absolute: 0.7 10*3/uL (ref 0.1–1.0)
Platelets: 230 10*3/uL (ref 150.0–400.0)
WBC: 6.7 10*3/uL (ref 4.5–10.5)

## 2012-03-07 LAB — LIPID PANEL
HDL: 79.3 mg/dL (ref >39.00)
Total CHOL/HDL Ratio: 2
VLDL: 13 mg/dL (ref 0.0–40.0)

## 2012-03-07 LAB — TSH: TSH: 0.88 u[IU]/mL (ref 0.35–5.50)

## 2012-03-07 LAB — HEPATIC FUNCTION PANEL
Bilirubin, Direct: 0 mg/dL (ref 0.0–0.3)
Total Bilirubin: 0.4 mg/dL (ref 0.3–1.2)

## 2012-03-07 MED ORDER — NEBIVOLOL HCL 5 MG PO TABS: 2.5000 mg | ORAL_TABLET | Freq: Every day | ORAL | Status: DC

## 2012-03-07 NOTE — Assessment & Plan Note (Signed)
Patient's last creatinine level was elevated in October of 2012. Patient attributed this to episode of severe gastroenteritis/colitis with associated dehydration. Monitor electrolytes and kidney function. She understands the importance of avoiding dehydration and nephrotoxic agents such as NSAIDs.  Lab Results  Component Value Date   CREATININE 1.40* 10/07/2011

## 2012-03-07 NOTE — Assessment & Plan Note (Addendum)
BP with manual cuff 180/80.  Nurse obtained 132/92.  Patient may have white coat hypertension.  Considering recent issues with palpitations, start low dose B Blocker.  Bystolic 5 mg once daily. Obtain renal doppler ultrasound considering abdominal bruit.

## 2012-03-07 NOTE — Assessment & Plan Note (Signed)
Patient with intermittent palpitations and the constellation of symptoms worrisome for ischemic heart disease. Refer to cardiology for further testing.

## 2012-03-07 NOTE — Assessment & Plan Note (Signed)
Patient has right thyroid fullness on exam. Obtain thyroid ultrasound and TFTs.

## 2012-03-07 NOTE — Progress Notes (Signed)
Subjective:    Patient ID: Cynthia Berger, female    DOB: Jan 14, 1956, 56 y.o.   MRN: [id number]  HPI  56 year-old [place] American female to establish. She was referred to [place] by her gynecologist for elevated blood pressure readings. 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 Her blood pressure readings are somewhat labile. Home readings are usually fairly normal but blood pressure readings can be as high as 170-180 systolic when she is at doctors office.  She reports a history of intermittent panic attacks. She feels her heart beating very fast and has associated headache. She describes an episode 2 months ago when she was exercising on a treadmill when she felt her heart pounding and had a funny sensation in her neck and back. She also describes right jaw discomfort. Her symptoms promptly resolved after resting.   Review of Systems     Constitutional: Negative for activity change, appetite change and unexpected weight change.  Eyes: Negative for visual disturbance. recent normal eye exam in January 2013 Respiratory: Negative for cough, chest tightness and shortness of breath.   Cardiovascular: Negative for chest pain.  positive for palpitations Genitourinary: Negative for difficulty urinating.  Neurological: Negative for headaches.  Gastrointestinal: occasional heartburn, no melena or hematochezia Psych: Negative for depression or anxiety  Past Medical History  Diagnosis Date  . Fibroid     ASYMPTOMATIC  . 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 ASCUS (atypical squamous cells of undetermined significance) on Pap smear 01/2011    C&B -INADEQUATE ECC  . Vitamin d deficiency 11/2007    LOW AT 18  . AC (acromioclavicular) joint bone spurs     HEEL BONE SPURS-SURGERY 12/2010 LEFT FOOT  . History of kidney cancer 04-Jun-2004  . Death of family member     HUSBAND DIED IN Jun 04, 2008  . Antral  gastritis   . Hiatal hernia   . Positive H. pylori test   . Uterine fibroid   . GERD (gastroesophageal reflux disease)     History   Social History  . Marital Status: Married    Spouse Name: N/A    Number of Children: N/A  . Years of Education: N/A   Occupational History  . Not on file.   Social History Main Topics  . Smoking status: Never Smoker   . Smokeless tobacco: Never Used  . Alcohol Use: Yes     occasional  . Drug Use: No  . Sexually Active: Yes -- Female partner(s)    Birth Control/ Protection: Post-menopausal   Other Topics Concern  . Not on file   Social History Narrative  . No narrative on file    Past Surgical History  Procedure Date  . Cesarean section 1993     BTL AT THAT TIME  . Tubal ligation 1993     AT TIME OF C/S  . Nephrectomy 2005    KIDNEY CANCER  . Rotator cuff repair 2010    right  . Kidney removed Jun 04, 2004    right kidney  . Foot surgery 06-05-2011    left heel spur removed    Family History  Problem Relation Age of Onset  . Hypertension Mother   . Hypertension Sister   . Hypertension Son     Allergies  Allergen Reactions  . Penicillins     Current Outpatient Prescriptions on File Prior to Visit  Medication Sig Dispense Refill  .  esomeprazole (NEXIUM) 40 MG capsule Take 1 capsule (40 mg total) by mouth daily.  30 capsule  1  . Multiple Vitamins-Minerals (MULTIVITAMIN PO) Take by mouth.        BP 132/92  Pulse 72  Temp(Src) 98.2 F (36.8 C) (Oral)  Ht 5\' 3"  (1.6 m)  Wt 157 lb (71.215 kg)  BMI 27.81 kg/m2  LMP 08/23/2006  EKG shows normal sinus rhythm at 76 beats per minute with frequent PACs.      Objective:   Physical Exam  Constitutional: She appears well-developed and well-nourished. No distress.  HENT:  Head: Normocephalic and atraumatic.  Right Ear: External ear normal.  Left Ear: External ear normal.  Mouth/Throat: Oropharynx is clear and moist.  Eyes: Conjunctivae are normal. Pupils are equal, round, and  reactive to light.  Neck: Neck supple. Thyromegaly present.       Right thyroid fullness  Cardiovascular: Normal rate.        Occasional ectopic beats SEM  2/6 left sternal border.  No accentuation with left hand grip or valsalva  Pulmonary/Chest: Effort normal and breath sounds normal. She has no wheezes. She has no rales.  Abdominal: Soft. Bowel sounds are normal. There is no tenderness.       Abdominal bruit  Musculoskeletal: Normal range of motion.       Trace bilateral lower extremity edema  Lymphadenopathy:    She has no cervical adenopathy.  Skin: Skin is warm and dry.  Psychiatric: She has a normal mood and affect. Her behavior is normal.       Assessment & Plan:

## 2012-03-09 ENCOUNTER — Telehealth: Payer: Self-pay | Admitting: *Deleted

## 2012-03-09 ENCOUNTER — Ambulatory Visit
Admission: RE | Admit: 2012-03-09 | Discharge: 2012-03-09 | Disposition: A | Discharge status: Home / Self Care | Payer: Commercial Managed Care - PPO | Source: Ambulatory Visit | Attending: Internal Medicine | Admitting: Internal Medicine

## 2012-03-09 DIAGNOSIS — E010 Iodine-deficiency related diffuse (endemic) goiter: Secondary | ICD-10-CM

## 2012-03-09 LAB — CATECHOLAMINES, FRACTIONATED, PLASMA
Catecholamines, Total: 870 pg/mL
Epinephrine: 62 pg/mL
Norepinephrine: 808 pg/mL

## 2012-03-09 NOTE — Telephone Encounter (Signed)
(  triage voicemail)  Pt wanting to know if he can take tylenol for minor irritation in the chest, back and arm pain.  Please call back and advise.

## 2012-03-09 NOTE — Telephone Encounter (Signed)
Pt is getting a Cardiology referral and per Dr Artist Pais she can take Tylenol for her chest irritation till she sees cardiology. Pt aware

## 2012-03-12 ENCOUNTER — Encounter: Payer: Self-pay | Admitting: Gynecology

## 2012-03-12 ENCOUNTER — Ambulatory Visit (INDEPENDENT_AMBULATORY_CARE_PROVIDER_SITE_OTHER): Payer: Commercial Managed Care - PPO | Admitting: Gynecology

## 2012-03-12 ENCOUNTER — Ambulatory Visit (HOSPITAL_COMMUNITY): Payer: Commercial Managed Care - PPO

## 2012-03-12 ENCOUNTER — Other Ambulatory Visit: Payer: Self-pay | Admitting: Internal Medicine

## 2012-03-12 ENCOUNTER — Other Ambulatory Visit: Payer: Self-pay | Admitting: Gynecology

## 2012-03-12 ENCOUNTER — Ambulatory Visit (INDEPENDENT_AMBULATORY_CARE_PROVIDER_SITE_OTHER): Payer: Commercial Managed Care - PPO

## 2012-03-12 DIAGNOSIS — D259 Leiomyoma of uterus, unspecified: Secondary | ICD-10-CM

## 2012-03-12 DIAGNOSIS — D251 Intramural leiomyoma of uterus: Secondary | ICD-10-CM

## 2012-03-12 DIAGNOSIS — E041 Nontoxic single thyroid nodule: Secondary | ICD-10-CM

## 2012-03-12 DIAGNOSIS — N83339 Acquired atrophy of ovary and fallopian tube, unspecified side: Secondary | ICD-10-CM

## 2012-03-12 DIAGNOSIS — N950 Postmenopausal bleeding: Secondary | ICD-10-CM

## 2012-03-12 DIAGNOSIS — N930 Postcoital and contact bleeding: Secondary | ICD-10-CM

## 2012-03-12 NOTE — Progress Notes (Signed)
Patient presents for sonohysterogram. She saw Harriett Sine and complained of some postcoital spotting. She has no other bleeding other than after intercourse occasional spotting.  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 Her last Pap smear was normal she knows to follow up every six-month Pap smears until we have four normal reports then go back to once a year.  Ultrasound shows endometrial echo 5.2 mm. 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 Right/left ovaries grossly normal cul-de-sac negative. Sonohysterogram was performed, sterile technique, did required anterior lip stabilization with tenaculum, good distention with distortion of the cavity by a myoma but no other abnormalities. Endometrial biopsy was taken. Patient will follow up for results. If negative we'll plan expectant management otherwise the triage based on results.

## 2012-03-12 NOTE — Patient Instructions (Signed)
Follow up for biopsy results.

## 2012-03-13 ENCOUNTER — Other Ambulatory Visit: Payer: Self-pay | Admitting: Internal Medicine

## 2012-03-13 DIAGNOSIS — E041 Nontoxic single thyroid nodule: Secondary | ICD-10-CM

## 2012-03-14 ENCOUNTER — Telehealth: Payer: Self-pay | Admitting: Internal Medicine

## 2012-03-14 NOTE — Telephone Encounter (Signed)
Pt would like nurse to return her call concerning test MD has ordered.

## 2012-03-15 NOTE — Telephone Encounter (Signed)
Pt and her son will call back next week when we get the release papers to talk to her son and Dr. Artist Pais is back.

## 2012-03-15 NOTE — Telephone Encounter (Signed)
Pt is returning deb please call [phone]

## 2012-03-15 NOTE — Telephone Encounter (Signed)
LMTCB

## 2012-03-19 ENCOUNTER — Telehealth: Payer: Self-pay | Admitting: Internal Medicine

## 2012-03-19 NOTE — Telephone Encounter (Signed)
Spoke with son re:  Thyroid u/s results.

## 2012-03-19 NOTE — Telephone Encounter (Signed)
Pts son req call back from Dr Artist Pais or nurse re: the biopsy pt is sched for tomorrow. Pls call.

## 2012-03-20 ENCOUNTER — Ambulatory Visit
Admission: RE | Admit: 2012-03-20 | Discharge: 2012-03-20 | Disposition: A | Discharge status: Home / Self Care | Payer: Commercial Managed Care - PPO | Source: Ambulatory Visit | Attending: Internal Medicine | Admitting: Internal Medicine

## 2012-03-20 ENCOUNTER — Other Ambulatory Visit (HOSPITAL_COMMUNITY)
Admission: RE | Admit: 2012-03-20 | Discharge: 2012-03-20 | Disposition: A | Discharge status: Home / Self Care | Payer: Commercial Managed Care - PPO | Source: Ambulatory Visit | Attending: Interventional Radiology | Admitting: Interventional Radiology

## 2012-03-20 ENCOUNTER — Encounter: Payer: Self-pay | Admitting: Women's Health

## 2012-03-20 DIAGNOSIS — E049 Nontoxic goiter, unspecified: Secondary | ICD-10-CM | POA: Insufficient documentation

## 2012-03-20 DIAGNOSIS — E041 Nontoxic single thyroid nodule: Secondary | ICD-10-CM

## 2012-03-21 ENCOUNTER — Other Ambulatory Visit: Payer: Self-pay | Admitting: Cardiology

## 2012-03-21 DIAGNOSIS — E041 Nontoxic single thyroid nodule: Secondary | ICD-10-CM

## 2012-03-21 DIAGNOSIS — R0989 Other specified symptoms and signs involving the circulatory and respiratory systems: Secondary | ICD-10-CM

## 2012-03-22 ENCOUNTER — Telehealth: Payer: Self-pay

## 2012-03-22 NOTE — Telephone Encounter (Signed)
Pt's son called and states pt would like her results to be given to her son.

## 2012-03-23 NOTE — Telephone Encounter (Signed)
Please call son Renae Gloss.  Thyroid biopsy showed benign thyroid nodule.  I would still recommend endo consult with Dr. Evlyn Kanner re: multinodular goiter

## 2012-03-23 NOTE — Telephone Encounter (Signed)
This was already taken care of.

## 2012-03-23 NOTE — Telephone Encounter (Signed)
See result note.  

## 2012-03-28 ENCOUNTER — Encounter: Payer: Self-pay | Admitting: Gynecology

## 2012-03-28 ENCOUNTER — Encounter (INDEPENDENT_AMBULATORY_CARE_PROVIDER_SITE_OTHER): Payer: Commercial Managed Care - PPO

## 2012-03-28 ENCOUNTER — Ambulatory Visit (INDEPENDENT_AMBULATORY_CARE_PROVIDER_SITE_OTHER): Payer: Commercial Managed Care - PPO | Admitting: Gynecology

## 2012-03-28 ENCOUNTER — Telehealth: Payer: Self-pay

## 2012-03-28 DIAGNOSIS — N950 Postmenopausal bleeding: Secondary | ICD-10-CM

## 2012-03-28 DIAGNOSIS — D259 Leiomyoma of uterus, unspecified: Secondary | ICD-10-CM

## 2012-03-28 DIAGNOSIS — I10 Essential (primary) hypertension: Secondary | ICD-10-CM

## 2012-03-28 DIAGNOSIS — R0989 Other specified symptoms and signs involving the circulatory and respiratory systems: Secondary | ICD-10-CM

## 2012-03-28 NOTE — Patient Instructions (Signed)
Office will contact you to schedule surgery. 

## 2012-03-28 NOTE — Progress Notes (Signed)
Patient presents to discuss her biopsy results from her sonohysterogram due to post coital bleeding.  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 The endometrial biopsy showed no endometrial tissue. Options for management reviewed to include no further workup, blind rebiopsy, and hysteroscopy with D&C/resection submucous myoma. As the bleeding is becoming more consistent postmenopausal yet think a more aggressive evaluation is warranted as does the patient and we will proceed with a hysteroscopy D&C. Was involved with the procedure to include instrumentation dilatation and curettage portion with use of the resectoscope. The risks of infection, prolonged antibiotics, hemorrhage necessitating transfusion and the risks of transfusion to include transfusion reaction, hepatitis, HIV, mad cow disease and other unknown entities. The risk of uterine perforation with damage to internal organs including bowel, bladder, ureters, nerves requiring major exploratory reparative surgeries and future reparative surgeries including bowel resection, your referral damage repair, bladder repair and ostomy formation was all discussed understood and accepted. The risk of distended media absorption leading to metabolic complications including coma and seizures was also discussed with her. We will schedule this at her convenience.

## 2012-03-28 NOTE — Telephone Encounter (Signed)
Patient was in the office a couple of hours ago for [facility]. I received her order for surgery and went ahead and called her because i am out of the office until Tuesday.  Patient said she had not had time to look at her calendar and she also has an appt next Wed, 17th that she needs to go to before she picks a date.  She asked me to call her next week after that.  I did remind her that Dr. Velvet Bathe wants her to get medical clearance from her primary care MD for surgery.

## 2012-04-03 ENCOUNTER — Ambulatory Visit (HOSPITAL_COMMUNITY)
Admission: RE | Admit: 2012-04-03 | Discharge: 2012-04-03 | Disposition: A | Discharge status: Home / Self Care | Payer: Commercial Managed Care - PPO | Source: Ambulatory Visit | Attending: Gynecology | Admitting: Gynecology

## 2012-04-03 DIAGNOSIS — Z1231 Encounter for screening mammogram for malignant neoplasm of breast: Secondary | ICD-10-CM

## 2012-04-04 ENCOUNTER — Ambulatory Visit (INDEPENDENT_AMBULATORY_CARE_PROVIDER_SITE_OTHER): Payer: Commercial Managed Care - PPO | Admitting: Cardiology

## 2012-04-04 ENCOUNTER — Encounter: Payer: Self-pay | Admitting: Cardiology

## 2012-04-04 VITALS — BP 140/80 | HR 60 | Ht 63.0 in | Wt 156.8 lb

## 2012-04-04 DIAGNOSIS — R002 Palpitations: Secondary | ICD-10-CM

## 2012-04-04 DIAGNOSIS — I10 Essential (primary) hypertension: Secondary | ICD-10-CM

## 2012-04-04 NOTE — Patient Instructions (Signed)
Your physician recommends that you schedule a follow-up appointment in: 6-8 WEEKS  Your physician has requested that you have an echocardiogram. Echocardiography is a painless test that uses sound waves to create images of your heart. It provides your doctor with information about the size and shape of your heart and how well your heart's chambers and valves are working. This procedure takes approximately one hour. There are no restrictions for this procedure.   Your physician has recommended that you wear an event monitor. Event monitors are medical devices that record the heart's electrical activity. Doctors most often us these monitors to diagnose arrhythmias. Arrhythmias are problems with the speed or rhythm of the heartbeat. The monitor is a small, portable device. You can wear one while you do your normal daily activities. This is usually used to diagnose what is causing palpitations/syncope (passing out).    

## 2012-04-04 NOTE — Assessment & Plan Note (Signed)
Etiology unclear. Note she is scheduled to see an endocrinologist concerning enlarged thyroid. However recent TSH normal. Plan Cardionet to further evaluate. Schedule echocardiogram to quantify LV function. Continue beta blocker.

## 2012-04-04 NOTE — Assessment & Plan Note (Signed)
Blood pressure controlled. Continue present medications. 

## 2012-04-04 NOTE — Progress Notes (Signed)
HPI: 56 year-old female for evaluation of palpitations. 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 No aneurysm or left renal artery stenosis. TSH and potassium in March of 2013 for normal. Patient states that since July of 2012 she has had intermittent palpitations. They are sudden in onset and described as her heart racing. She has had 4 separate episodes. Her longest lasted approximately 3 minutes. Her last episode occurred one month ago. Her symptoms resolved spontaneously. There is associated lightheadedness but no syncope. No dyspnea or chest pain. She denies dyspnea on exertion, orthopnea, PND, pedal edema. No history of exertional chest pain. Because of her palpitations we were asked to further evaluate. Note she recently had a biopsy of a thyroid nodule. She is scheduled to see endocrinology.  Current Outpatient Prescriptions  Medication Sig Dispense Refill  . esomeprazole (NEXIUM) 40 MG capsule Take 1 capsule (40 mg total) by mouth daily.  30 capsule  1  . Multiple Vitamins-Minerals (MULTIVITAMIN PO) Take by mouth.      . nebivolol (BYSTOLIC) 5 MG tablet Take 0.5 tablets (2.5 mg total) by mouth daily.  30 tablet  0  . DISCONTD: dicyclomine (BENTYL) 20 MG tablet Take 1 tablet (20 mg total) by mouth 2 (two) times daily.  40 tablet  0    Allergies  Allergen Reactions  . Penicillins     Past Medical History  Diagnosis Date  . Fibroid     ASYMPTOMATIC  . 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 ASCUS (atypical squamous cells of undetermined significance) on Pap smear 01/2011    C&B -INADEQUATE ECC  . Vitamin d deficiency 11/2007    LOW AT 18  . AC (acromioclavicular) joint bone spurs     HEEL BONE SPURS-SURGERY 12/2010 LEFT FOOT  . History of kidney cancer May 27, 2004  . Death of family member     HUSBAND DIED IN 05-27-08  . Antral gastritis   . Hiatal hernia   . Positive H. pylori test    . Uterine fibroid   . GERD (gastroesophageal reflux disease)   . Hypertension   . Thyroid nodule     Past Surgical History  Procedure Date  . Cesarean section 1993     BTL AT THAT TIME  . Tubal ligation 1993     AT TIME OF C/S  . Nephrectomy 2005    KIDNEY CANCER  . Rotator cuff repair 2010    right  . Foot surgery May 28, 2011    left heel spur removed    History   Social History  . Marital Status: Single    Spouse Name: N/A    Number of Children: 4  . Years of Education: N/A   Occupational History  .     Social History Main Topics  . Smoking status: Never Smoker   . Smokeless tobacco: Never Used  . Alcohol Use: Yes     occasional  . Drug Use: No  . Sexually Active: Yes -- Female partner(s)    Birth Control/ Protection: Post-menopausal   Other Topics Concern  . Not on file   Social History Narrative  . No narrative on file    Family History  Problem Relation Age of Onset  . Hypertension Mother   . Hypertension Sister   . Hypertension Son     ROS: no fevers or chills, productive cough, hemoptysis, dysphasia, odynophagia, melena, hematochezia, dysuria, hematuria, rash,  seizure activity, orthopnea, PND, pedal edema, claudication. Remaining systems are negative.  Physical Exam:   Blood pressure 140/80, pulse 60, height 5\' 3"  (1.6 m), weight 156 lb 12 oz (71.101 kg), last menstrual period 08/23/2006.  General:  Well developed/well nourished in NAD Skin warm/dry Patient not depressed No peripheral clubbing Back-normal HEENT-normal/normal eyelids Neck supple/normal carotid upstroke bilaterally; no bruits; no JVD;  chest - CTA/ normal expansion CV - RRR/normal S1 and S2; no murmurs, rubs or gallops;  PMI nondisplaced Abdomen -NT/ND, no HSM, no mass, + bowel sounds, no bruit 2+ femoral pulses, no bruits Ext-no edema, chords, 2+ DP Neuro-grossly nonfocal  ECG 03/07/2012 sinus rhythm with occasional PACs. Axis is normal. No significant ST changes.

## 2012-04-05 ENCOUNTER — Telehealth: Payer: Self-pay | Admitting: Gynecology

## 2012-04-05 NOTE — Telephone Encounter (Signed)
I spoke with patient last week and she did not want to schedule her surgery until after an appointment that she had on 4/17.  I called her today and she said she has an appt with Dr. Cathie Hoops on Monday for medical clearance and she wants to talk with him before scheduling.  She said she will call me Monday or drop by [facility] after her appointment to talk with me.  I will wait to hear from her.

## 2012-04-09 ENCOUNTER — Ambulatory Visit (INDEPENDENT_AMBULATORY_CARE_PROVIDER_SITE_OTHER): Payer: Commercial Managed Care - PPO | Admitting: Internal Medicine

## 2012-04-09 ENCOUNTER — Encounter: Payer: Self-pay | Admitting: Internal Medicine

## 2012-04-09 VITALS — BP 144/84 | HR 60 | Temp 97.9°F | Wt 158.0 lb

## 2012-04-09 DIAGNOSIS — E049 Nontoxic goiter, unspecified: Secondary | ICD-10-CM

## 2012-04-09 DIAGNOSIS — R0989 Other specified symptoms and signs involving the circulatory and respiratory systems: Secondary | ICD-10-CM

## 2012-04-09 DIAGNOSIS — I10 Essential (primary) hypertension: Secondary | ICD-10-CM

## 2012-04-09 DIAGNOSIS — E010 Iodine-deficiency related diffuse (endemic) goiter: Secondary | ICD-10-CM

## 2012-04-09 MED ORDER — NEBIVOLOL HCL 5 MG PO TABS: 2.5000 mg | ORAL_TABLET | Freq: Every day | ORAL | Status: DC

## 2012-04-09 NOTE — Assessment & Plan Note (Signed)
Thyroid ultrasound showed multinodular goiter. The dominant nodule. This was biopsied and showed benign thyroid tissue. Followup in endocrinology for further management.

## 2012-04-09 NOTE — Assessment & Plan Note (Signed)
Renal artery Doppler negative for stenosis.

## 2012-04-09 NOTE — Assessment & Plan Note (Signed)
Patient has white coat hypertension. Her home blood pressure readings are normal. Continue low-dose beta blocker.

## 2012-04-09 NOTE — Progress Notes (Signed)
Subjective:    Patient ID: Cynthia Berger, female    DOB: 08/09/1956, 56 y.o.   MRN: [id number]  HPI  A 56 year old female in for followup regarding right thyroid fullness and palpitations. The patient had a thyroid ultrasound which showed multinodular goiter and dominant nodule in her right thyroid. She underwent fine needle aspiration which showed benign thyroid tissue. She was referred to endocrinology for further management of multinodular goiter. She is awaiting her appointment.  She was seen by her cardiologist regarding palpitations. An event recorder is being arranged. Mild improvement of palpitations since starting low-dose beta blocker.  Interval history-patient followed by her GYN for abnormal uterine bleeding. She has uterine fibroids but we cannot rule out other cause. Further surgical procedures are on hold until her cardiac and endocrine issues evaluated.  Review of Systems     Past Medical History  Diagnosis Date  . Fibroid     ASYMPTOMATIC  . 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 ASCUS (atypical squamous cells of undetermined significance) on Pap smear 01/2011    C&B -INADEQUATE ECC  . Vitamin d deficiency 11/2007    LOW AT 18  . AC (acromioclavicular) joint bone spurs     HEEL BONE SPURS-SURGERY 12/2010 LEFT FOOT  . History of kidney cancer 2004/05/28  . Death of family member     HUSBAND DIED IN 05/28/2008  . Antral gastritis   . Hiatal hernia   . Positive H. pylori test   . Uterine fibroid   . GERD (gastroesophageal reflux disease)   . Hypertension   . Thyroid nodule     History   Social History  . Marital Status: Single    Spouse Name: N/A    Number of Children: 4  . Years of Education: N/A   Occupational History  .     Social History Main Topics  . Smoking status: Never Smoker   . Smokeless tobacco: Never Used  . Alcohol Use: Yes     occasional  . Drug Use: No  . Sexually  Active: Yes -- Female partner(s)    Birth Control/ Protection: Post-menopausal   Other Topics Concern  . Not on file   Social History Narrative  . No narrative on file    Past Surgical History  Procedure Date  . Cesarean section 1993     BTL AT THAT TIME  . Tubal ligation 1993     AT TIME OF C/S  . Nephrectomy 2005    KIDNEY CANCER  . Rotator cuff repair 2010    right  . Foot surgery May 29, 2011    left heel spur removed    Family History  Problem Relation Age of Onset  . Hypertension Mother   . Hypertension Sister   . Hypertension Son     Allergies  Allergen Reactions  . Penicillins     Current Outpatient Prescriptions on File Prior to Visit  Medication Sig Dispense Refill  . esomeprazole (NEXIUM) 40 MG capsule Take 1 capsule (40 mg total) by mouth daily.  30 capsule  1  . Multiple Vitamins-Minerals (MULTIVITAMIN PO) Take by mouth.      . nebivolol (BYSTOLIC) 5 MG tablet Take 0.5 tablets (2.5 mg total) by mouth daily.  30 tablet  0  . DISCONTD: dicyclomine (BENTYL) 20 MG tablet Take 1 tablet (20 mg total) by mouth 2 (two) times daily.  40 tablet  0    BP  144/84  Pulse 60  Temp(Src) 97.9 F (36.6 C) (Oral)  Wt 158 lb (71.668 kg)  LMP 08/23/2006    Objective:   Physical Exam  Constitutional: She is oriented to person, place, and time. She appears well-developed and well-nourished.  Cardiovascular: Normal rate, regular rhythm and normal heart sounds.   Pulmonary/Chest: Effort normal and breath sounds normal. She has no wheezes. She has no rales.  Musculoskeletal: She exhibits no edema.  Neurological: She is oriented to person, place, and time.          Assessment & Plan:

## 2012-04-10 ENCOUNTER — Ambulatory Visit (HOSPITAL_COMMUNITY): Payer: Commercial Managed Care - PPO | Attending: Cardiology

## 2012-04-10 ENCOUNTER — Other Ambulatory Visit: Payer: Self-pay

## 2012-04-10 ENCOUNTER — Encounter (INDEPENDENT_AMBULATORY_CARE_PROVIDER_SITE_OTHER): Payer: Commercial Managed Care - PPO

## 2012-04-10 DIAGNOSIS — R002 Palpitations: Secondary | ICD-10-CM

## 2012-04-10 DIAGNOSIS — C579 Malignant neoplasm of female genital organ, unspecified: Secondary | ICD-10-CM | POA: Insufficient documentation

## 2012-04-10 DIAGNOSIS — I519 Heart disease, unspecified: Secondary | ICD-10-CM | POA: Insufficient documentation

## 2012-04-10 DIAGNOSIS — I10 Essential (primary) hypertension: Secondary | ICD-10-CM | POA: Insufficient documentation

## 2012-04-18 ENCOUNTER — Ambulatory Visit (INDEPENDENT_AMBULATORY_CARE_PROVIDER_SITE_OTHER): Payer: Commercial Managed Care - PPO | Admitting: Endocrinology

## 2012-04-18 ENCOUNTER — Encounter: Payer: Self-pay | Admitting: Endocrinology

## 2012-04-18 VITALS — BP 142/86 | HR 65 | Temp 98.3°F | Ht 63.0 in | Wt 159.0 lb

## 2012-04-18 DIAGNOSIS — E042 Nontoxic multinodular goiter: Secondary | ICD-10-CM

## 2012-04-18 NOTE — Patient Instructions (Addendum)
No treatment is needed now.most of the time, a "lumpy thyroid" will eventually become overactive.  this is usually a slow process, happening over the span of many years. Please come back for a follow-up appointment in 6-12 months. Please feel free to have your symptoms checked by dr Artist Pais, as they are not coming from the thyroid.

## 2012-04-18 NOTE — Progress Notes (Signed)
Subjective:    Patient ID: [name], female    DOB: 1956/06/07, 56 y.o.   MRN: [id number]  HPI Pt states 1 year of slight swelling at the anterior neck, and assoc palpitations.   Past Medical History  Diagnosis Date  . Fibroid     ASYMPTOMATIC  . 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 ASCUS (atypical squamous cells of undetermined significance) on Pap smear 01/2011    C&B -INADEQUATE ECC  . Vitamin d deficiency 11/2007    LOW AT 18  . AC (acromioclavicular) joint bone spurs     HEEL BONE SPURS-SURGERY 12/2010 LEFT FOOT  . History of kidney cancer 2004-05-19  . Death of family member     HUSBAND DIED IN May 19, 2008  . Antral gastritis   . Hiatal hernia   . Positive H. pylori test   . Uterine fibroid   . GERD (gastroesophageal reflux disease)   . Hypertension   . Thyroid nodule     Past Surgical History  Procedure Date  . Cesarean section 1993     BTL AT THAT TIME  . Tubal ligation 1993     AT TIME OF C/S  . Nephrectomy 2005    KIDNEY CANCER  . Rotator cuff repair 2010    right  . Foot surgery 05-20-2011    left heel spur removed    History   Social History  . Marital Status: Single    Spouse Name: N/A    Number of Children: 4  . Years of Education: N/A   Occupational History  .     Social History Main Topics  . Smoking status: Never Smoker   . Smokeless tobacco: Never Used  . Alcohol Use: Yes     occasional  . Drug Use: No  . Sexually Active: Yes -- Female partner(s)    Birth Control/ Protection: Post-menopausal   Other Topics Concern  . Not on file   Social History Narrative  . No narrative on file    Current Outpatient Prescriptions on File Prior to Visit  Medication Sig Dispense Refill  . esomeprazole (NEXIUM) 40 MG capsule Take 1 capsule (40 mg total) by mouth daily.  30 capsule  1  . Multiple Vitamins-Minerals (MULTIVITAMIN PO) Take by mouth.      . nebivolol (BYSTOLIC) 5 MG tablet  Take 0.5 tablets (2.5 mg total) by mouth daily.  30 tablet  5  . DISCONTD: dicyclomine (BENTYL) 20 MG tablet Take 1 tablet (20 mg total) by mouth 2 (two) times daily.  40 tablet  0    Allergies  Allergen Reactions  . Penicillins     Family History  Problem Relation Age of Onset  . Hypertension Mother   . Hypertension Sister   . Hypertension Son   mother had thyroidectomy--uncertain details  BP 142/86  Pulse 65  Temp(Src) 98.3 F (36.8 C) (Oral)  Ht 5\' 3"  (1.6 m)  Wt 159 lb (72.122 kg)  BMI 28.17 kg/m2  SpO2 99%  LMP 08/23/2006  Review of Systems denies hoarseness, double vision, sob, diarrhea, excessive diaphoresis, numbness, tremor, easy bruising, and rhinorrhea.  She has urinary frequency, myalgias, anxiety, hot flashes, and weight gain.  She has 11 month of slight burning sensation at the right side of the head.      Objective:   Physical Exam VS: see vs page GEN: no distress HEAD: head: no deformity eyes: no periorbital swelling,  no proptosis external nose and ears are normal mouth: no lesion seen. NECK: multinodular goiter.  The right nodule is easily palpable.   CHEST WALL: no deformity LUNGS:  Clear to auscultation CV: reg rate and rhythm, no murmur ABD: abdomen is soft, nontender.  no hepatosplenomegaly.  not distended.  no hernia MUSCULOSKELETAL: muscle bulk and strength are grossly normal.  no obvious joint swelling.  gait is normal and steady EXTEMITIES: no deformity.  no ulcer on the feet.  feet are of normal color and temp.  no edema PULSES: dorsalis pedis intact bilat.   NEURO:  cn 2-12 grossly intact.   readily moves all 4's.  sensation is intact to touch on the feet SKIN:  Normal texture and temperature.  No rash or suspicious lesion is visible.   NODES:  None palpable at the neck. PSYCH: alert, oriented x3.  Does not appear anxious nor depressed. Lab Results  Component Value Date   TSH 0.88 03/07/2012  (i reviewed cytology report)    Assessment &  Plan:  Multinodular goiter, which is usually hereditary. Dysesthesia of the head, not thyroid-related Anxiety, not thyroid-related.

## 2012-04-20 ENCOUNTER — Ambulatory Visit (INDEPENDENT_AMBULATORY_CARE_PROVIDER_SITE_OTHER): Payer: Commercial Managed Care - PPO | Admitting: Internal Medicine

## 2012-04-20 ENCOUNTER — Ambulatory Visit (INDEPENDENT_AMBULATORY_CARE_PROVIDER_SITE_OTHER)
Admission: RE | Admit: 2012-04-20 | Discharge: 2012-04-20 | Disposition: A | Discharge status: Home / Self Care | Payer: Commercial Managed Care - PPO | Source: Ambulatory Visit | Attending: Internal Medicine | Admitting: Internal Medicine

## 2012-04-20 ENCOUNTER — Encounter: Payer: Self-pay | Admitting: Internal Medicine

## 2012-04-20 VITALS — BP 138/90 | Temp 98.3°F | Wt 158.0 lb

## 2012-04-20 DIAGNOSIS — M542 Cervicalgia: Secondary | ICD-10-CM

## 2012-04-20 DIAGNOSIS — R002 Palpitations: Secondary | ICD-10-CM

## 2012-04-20 MED ORDER — CYCLOBENZAPRINE HCL 5 MG PO TABS: 5.0000 mg | ORAL_TABLET | Freq: Every evening | ORAL | Status: AC | PRN

## 2012-04-20 MED ORDER — TRAMADOL HCL 50 MG PO TABS: 50.0000 mg | ORAL_TABLET | Freq: Two times a day (BID) | ORAL | Status: AC | PRN

## 2012-04-20 MED ORDER — NEBIVOLOL HCL 5 MG PO TABS: 2.5000 mg | ORAL_TABLET | Freq: Every day | ORAL | Status: DC

## 2012-04-20 NOTE — Assessment & Plan Note (Signed)
Improved with bystolic.  Her 2 D Echo shows decreased EF 45-50% and diffuse hypokinesis.  She has follow up with Dr. Jens Som.

## 2012-04-20 NOTE — Assessment & Plan Note (Signed)
I suspect patient's right-sided neck pain and headaches are related. She may have cervical disc disease. Trial of muscle relaxers and tramadol. Obtain x-[place] of his C-spine. If persistent symptoms we discussed obtaining  MRI of C Spine.

## 2012-04-20 NOTE — Progress Notes (Signed)
Subjective:    Patient ID: Cynthia Berger, female    DOB: 09-Dec-1956, 56 y.o.   MRN: [id number]  HPI  56 year old African American female with history of palpitations and multinodular goiter complains of intermittent right-sided neck pain for the last 5 days. Her symptoms are associated with headache which she describes as a burning sensation. Symptoms also can be shooting and throbbing. She also describes right-sided earache. She has no associated nausea or photophobia. She has some improvement with over-the-counter Tylenol.  Review of Systems Negative for radiation of pain,  No hearing loss, no fever or chills    Past Medical History  Diagnosis Date  . Fibroid     ASYMPTOMATIC  . 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 ASCUS (atypical squamous cells of undetermined significance) on Pap smear 01/2011    C&B -INADEQUATE ECC  . Vitamin d deficiency 11/2007    LOW AT 18  . AC (acromioclavicular) joint bone spurs     HEEL BONE SPURS-SURGERY 12/2010 LEFT FOOT  . History of kidney cancer 05-20-04  . Death of family member     HUSBAND DIED IN 20-May-2008  . Antral gastritis   . Hiatal hernia   . Positive H. pylori test   . Uterine fibroid   . GERD (gastroesophageal reflux disease)   . Hypertension   . Thyroid nodule     History   Social History  . Marital Status: Single    Spouse Name: N/A    Number of Children: 4  . Years of Education: N/A   Occupational History  .     Social History Main Topics  . Smoking status: Never Smoker   . Smokeless tobacco: Never Used  . Alcohol Use: Yes     occasional  . Drug Use: No  . Sexually Active: Yes -- Female partner(s)    Birth Control/ Protection: Post-menopausal   Other Topics Concern  . Not on file   Social History Narrative  . No narrative on file    Past Surgical History  Procedure Date  . Cesarean section 1993     BTL AT THAT TIME  . Tubal ligation 1993     AT TIME OF C/S  . Nephrectomy 2005    KIDNEY CANCER  . Rotator cuff repair 2010    right  . Foot surgery May 21, 2011    left heel spur removed    Family History  Problem Relation Age of Onset  . Hypertension Mother   . Hypertension Sister   . Hypertension Son     Allergies  Allergen Reactions  . Penicillins     Current Outpatient Prescriptions on File Prior to Visit  Medication Sig Dispense Refill  . esomeprazole (NEXIUM) 40 MG capsule Take 1 capsule (40 mg total) by mouth daily.  30 capsule  1  . Multiple Vitamins-Minerals (MULTIVITAMIN PO) Take by mouth.      . DISCONTD: nebivolol (BYSTOLIC) 5 MG tablet Take 0.5 tablets (2.5 mg total) by mouth daily.  30 tablet  5  . DISCONTD: dicyclomine (BENTYL) 20 MG tablet Take 1 tablet (20 mg total) by mouth 2 (two) times daily.  40 tablet  0    BP 138/90  Temp(Src) 98.3 F (36.8 C) (Oral)  Wt 158 lb (71.668 kg)  LMP 08/23/2006    Objective:   Physical Exam  Constitutional: She is oriented to person, place, and time. She appears well-developed and well-nourished.  HENT:  Head: Normocephalic and atraumatic.  Right Ear: External ear normal.  Left Ear: External ear normal.       Mild discomfort with cervical side bending  Cardiovascular: Normal rate, regular rhythm and normal heart sounds.   Pulmonary/Chest: Effort normal and breath sounds normal. She has no wheezes. She has no rales.  Neurological: She is alert and oriented to person, place, and time. She has normal reflexes.       Upper extremity muscle strength is 5 out of 5 bilaterally  Skin: Skin is warm and dry.       Assessment & Plan:

## 2012-04-24 ENCOUNTER — Telehealth: Payer: Self-pay | Admitting: Internal Medicine

## 2012-04-24 NOTE — Telephone Encounter (Signed)
See result note.  

## 2012-04-24 NOTE — Telephone Encounter (Signed)
Pt requesting results from xray

## 2012-04-26 ENCOUNTER — Other Ambulatory Visit: Payer: Self-pay | Admitting: Internal Medicine

## 2012-04-26 DIAGNOSIS — M542 Cervicalgia: Secondary | ICD-10-CM

## 2012-04-26 DIAGNOSIS — R51 Headache: Secondary | ICD-10-CM

## 2012-05-01 ENCOUNTER — Encounter (HOSPITAL_BASED_OUTPATIENT_CLINIC_OR_DEPARTMENT_OTHER): Payer: Self-pay | Admitting: *Deleted

## 2012-05-01 ENCOUNTER — Ambulatory Visit (HOSPITAL_BASED_OUTPATIENT_CLINIC_OR_DEPARTMENT_OTHER): Payer: Commercial Managed Care - PPO

## 2012-05-01 ENCOUNTER — Emergency Department (INDEPENDENT_AMBULATORY_CARE_PROVIDER_SITE_OTHER): Payer: Commercial Managed Care - PPO

## 2012-05-01 ENCOUNTER — Ambulatory Visit (INDEPENDENT_AMBULATORY_CARE_PROVIDER_SITE_OTHER)
Admission: RE | Admit: 2012-05-01 | Discharge: 2012-05-01 | Disposition: A | Discharge status: Home / Self Care | Payer: Commercial Managed Care - PPO | Source: Ambulatory Visit | Attending: Internal Medicine | Admitting: Internal Medicine

## 2012-05-01 ENCOUNTER — Emergency Department (HOSPITAL_BASED_OUTPATIENT_CLINIC_OR_DEPARTMENT_OTHER)
Admission: EM | Admit: 2012-05-01 | Discharge: 2012-05-01 | Disposition: A | Discharge status: Home / Self Care | Payer: Commercial Managed Care - PPO | Attending: Emergency Medicine | Admitting: Emergency Medicine

## 2012-05-01 DIAGNOSIS — M436 Torticollis: Secondary | ICD-10-CM

## 2012-05-01 DIAGNOSIS — M5030 Other cervical disc degeneration, unspecified cervical region: Secondary | ICD-10-CM | POA: Insufficient documentation

## 2012-05-01 DIAGNOSIS — M542 Cervicalgia: Secondary | ICD-10-CM | POA: Insufficient documentation

## 2012-05-01 DIAGNOSIS — I10 Essential (primary) hypertension: Secondary | ICD-10-CM | POA: Insufficient documentation

## 2012-05-01 DIAGNOSIS — R51 Headache: Secondary | ICD-10-CM

## 2012-05-01 DIAGNOSIS — C649 Malignant neoplasm of unspecified kidney, except renal pelvis: Secondary | ICD-10-CM

## 2012-05-01 DIAGNOSIS — R209 Unspecified disturbances of skin sensation: Secondary | ICD-10-CM

## 2012-05-01 DIAGNOSIS — Z85528 Personal history of other malignant neoplasm of kidney: Secondary | ICD-10-CM | POA: Insufficient documentation

## 2012-05-01 DIAGNOSIS — Z79899 Other long term (current) drug therapy: Secondary | ICD-10-CM | POA: Insufficient documentation

## 2012-05-01 DIAGNOSIS — M5380 Other specified dorsopathies, site unspecified: Secondary | ICD-10-CM

## 2012-05-01 DIAGNOSIS — K219 Gastro-esophageal reflux disease without esophagitis: Secondary | ICD-10-CM | POA: Insufficient documentation

## 2012-05-01 LAB — BASIC METABOLIC PANEL
BUN: 17 mg/dL (ref 6–23)
Creatinine, Ser: 1.3 mg/dL — ABNORMAL HIGH (ref 0.50–1.10)
GFR calc Af Amer: 53 mL/min — ABNORMAL LOW (ref >90)
GFR calc non Af Amer: 46 mL/min — ABNORMAL LOW (ref >90)
Potassium: 3.8 mEq/L (ref 3.5–5.1)

## 2012-05-01 LAB — CBC
HCT: 38.7 % (ref 36.0–46.0)
MCHC: 33.3 g/dL (ref 30.0–36.0)
Platelets: 205 10*3/uL (ref 150–400)
RDW: 12.5 % (ref 11.5–15.5)

## 2012-05-01 LAB — URINALYSIS, ROUTINE W REFLEX MICROSCOPIC
Bilirubin Urine: NEGATIVE
Ketones, ur: NEGATIVE mg/dL
Nitrite: NEGATIVE
Specific Gravity, Urine: 1.005 (ref 1.005–1.030)
Urobilinogen, UA: 0.2 mg/dL (ref 0.0–1.0)

## 2012-05-01 LAB — DIFFERENTIAL
Basophils Absolute: 0 10*3/uL (ref 0.0–0.1)
Basophils Relative: 0 % (ref 0–1)
Monocytes Absolute: 0.7 10*3/uL (ref 0.1–1.0)
Neutro Abs: 3.1 10*3/uL (ref 1.7–7.7)

## 2012-05-01 MED ORDER — KETOROLAC TROMETHAMINE 60 MG/2ML IM SOLN: 60.0000 mg | Freq: Once | INTRAMUSCULAR | Status: DC

## 2012-05-01 MED ORDER — HYDROCODONE-ACETAMINOPHEN 5-500 MG PO TABS: 1.0000 | ORAL_TABLET | Freq: Four times a day (QID) | ORAL | Status: AC | PRN

## 2012-05-01 NOTE — ED Provider Notes (Addendum)
History    This chart was scribed for Cynthia Berger Cynthia Cords, MD, MD by Cynthia Pluck. The patient was seen in room MH02/MH02 and the patient's care was started at 12:22AM.   CSN: [id number]  Arrival date & time 05/01/12  0001   First MD Initiated Contact with Patient 05/01/12 0023      Chief Complaint  Patient presents with  . Neck Pain    (Consider location/radiation/quality/duration/timing/severity/associated sxs/prior treatment) Patient is a 56 y.o. female presenting with neck pain. The history is provided by the patient.  Neck Pain  This is a chronic problem. The current episode started more than 1 week ago (more than 2 weeks ago). The problem occurs constantly. The problem has not changed since onset.The pain is associated with nothing. There has been no fever. The pain is present in the right side. The quality of the pain is described as burning. The pain is severe. Exacerbated by: nothing. The pain is the same all the time. Pertinent negatives include no photophobia, no visual change, no chest pain, no numbness, no weight loss, no bowel incontinence, no bladder incontinence, no paresis, no tingling and no weakness. She has tried nothing for the symptoms. The treatment provided no relief.   Cynthia Berger is a 56 y.o. female who presents to the Emergency Department complaining of moderate neck pain that radiates to head and face onset 4 weeks. She reports headaches onset 4 weeks ago. Pt reports that she has MRI tomorrow but the pain and burning sensation has increased tonight. She reports that she had increased BP before arrival. She reports that pain is aggravated by exertion. Pain has been constant since onset without radiation.   Past Medical History  Diagnosis Date  . Fibroid     ASYMPTOMATIC  . 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 ASCUS (atypical squamous cells of undetermined significance) on Pap smear  01/2011    C&B -INADEQUATE ECC  . Vitamin d deficiency 11/2007    LOW AT 18  . AC (acromioclavicular) joint bone spurs     HEEL BONE SPURS-SURGERY 12/2010 LEFT FOOT  . History of kidney cancer 05-21-04  . Death of family member     HUSBAND DIED IN May 21, 2008  . Antral gastritis   . Hiatal hernia   . Positive H. pylori test   . Uterine fibroid   . GERD (gastroesophageal reflux disease)   . Hypertension   . Thyroid nodule     Past Surgical History  Procedure Date  . Cesarean section 1993     BTL AT THAT TIME  . Tubal ligation 1993     AT TIME OF C/S  . Nephrectomy 2005    KIDNEY CANCER  . Rotator cuff repair 2010    right  . Foot surgery May 22, 2011    left heel spur removed    Family History  Problem Relation Age of Onset  . Hypertension Mother   . Hypertension Sister   . Hypertension Son     History  Substance Use Topics  . Smoking status: Never Smoker   . Smokeless tobacco: Never Used  . Alcohol Use: Yes     occasional    OB History    Grav Para Term Preterm Abortions TAB SAB Ect Mult Living   6 4   2  2   4       Review of Systems  Constitutional: Negative for weight loss.  HENT:  Positive for neck pain. Negative for neck stiffness.   Eyes: Negative for photophobia.  Respiratory: Negative for shortness of breath.   Cardiovascular: Negative for chest pain.  Gastrointestinal: Negative for bowel incontinence.  Genitourinary: Negative for bladder incontinence.  Neurological: Negative for dizziness, tingling, facial asymmetry, weakness and numbness.  All other systems reviewed and are negative.  10 Systems reviewed and all are negative for acute change except as noted in the HPI.    Allergies  Penicillins  Home Medications   Current Outpatient Rx  Name Route Sig Dispense Refill  . CYCLOBENZAPRINE HCL 5 MG PO TABS Oral Take 1 tablet (5 mg total) by mouth at bedtime as needed for muscle spasms. 30 tablet 0  . ESOMEPRAZOLE MAGNESIUM 40 MG PO CPDR Oral Take 1 capsule  (40 mg total) by mouth daily. 30 capsule 1  . MULTIVITAMIN PO Oral Take by mouth.    . NEBIVOLOL HCL 5 MG PO TABS Oral Take 0.5 tablets (2.5 mg total) by mouth daily. 30 tablet 5  . TRAMADOL HCL 50 MG PO TABS Oral Take 1 tablet (50 mg total) by mouth every 12 (twelve) hours as needed for pain. 30 tablet 0    BP 174/85  Pulse 70  Temp(Src) 97.4 F (36.3 C) (Oral)  Resp 20  Ht 5\' 3"  (1.6 m)  Wt 158 lb (71.668 kg)  BMI 27.99 kg/m2  SpO2 100%  LMP 08/23/2006  Physical Exam  Nursing note and vitals reviewed. Constitutional: She is oriented to person, place, and time. She appears well-developed and well-nourished. No distress.  HENT:  Head: Normocephalic and atraumatic.  Right Ear: External ear normal.  Left Ear: External ear normal.  Mouth/Throat: Oropharynx is clear and moist.  Eyes: EOM are normal. Pupils are equal, round, and reactive to light.  Neck: Neck supple. No tracheal deviation present.       Midline trachea  Cardiovascular: Normal rate, regular rhythm and normal heart sounds.   Pulmonary/Chest: Effort normal and breath sounds normal. No respiratory distress.  Musculoskeletal: Normal range of motion.  Lymphadenopathy:    She has no cervical adenopathy.  Neurological: She is alert and oriented to person, place, and time. She has normal reflexes. She displays normal reflexes. No cranial nerve deficit.       5/5 grip strength  Intact sensation Cranial nerves 2-12 intact   Skin: Skin is warm and dry.  Psychiatric: She has a normal mood and affect. Her behavior is normal.    ED Course  Procedures (including critical care time) DIAGNOSTIC STUDIES: Oxygen Saturation is 100% on room air, normal by my interpretation.    COORDINATION OF CARE: 12:27PM EDP discusses pt ED treatment course with pt.   Labs Reviewed - No data to display No results found.   No diagnosis found.    MDM  Follow up for your previously scheduled MRI today.  Given the duration of symptoms  one EKG and one troponin are sufficient to rule out ACS.  ACS extremely unlikely with this presentation.  Suspect cervical radiculopathy in light of recent neck xray showing degenerative changes.   I personally performed the services described in this documentation, which was scribed in my presence. The recorded information has been reviewed and considered.     Date: 05/01/2012  Rate: 56  Rhythm: sinus bradycardia  QRS Axis: normal  Intervals: normal  ST/T Wave abnormalities: nonspecific ST changes  Conduction Disutrbances:none  Narrative Interpretation:   Old EKG Reviewed: none available  Jasmine Awe, MD 05/01/12 0431  Connie Hilgert K Gabriell Casimir-Rasch, MD 05/01/12 [phone]

## 2012-05-01 NOTE — Discharge Instructions (Signed)
Degenerative Disc Disease Degenerative disc disease is a condition caused by the changes that occur in the cushions of the backbone (spinal discs) as you grow older. Spinal discs are soft and compressible discs located between the bones of the spine (vertebrae). They act like shock absorbers. Degenerative disc disease can affect the wholespine. However, the neck and lower back are most commonly affected. Many changes can occur in the spinal discs with aging, such as:  The spinal discs may dry and shrink.   Small tears may occur in the tough, outer covering of the disc (annulus).   The disc space may become smaller due to loss of water.   Abnormal growths in the bone (spurs) may occur. This can put pressure on the nerve roots exiting the spinal canal, causing pain.   The spinal canal may become narrowed.  CAUSES  Degenerative disc disease is a condition caused by the changes that occur in the spinal discs with aging. The exact cause is not known, but there is a genetic basis for many patients. Degenerative changes can occur due to loss of fluid in the disc. This makes the disc thinner and reduces the space between the backbones. Small cracks can develop in the outer layer of the disc. This can lead to the breakdown of the disc. You are more likely to get degenerative disc disease if you are overweight. Smoking cigarettes and doing heavy work such as weightlifting can also increase your risk of this condition. Degenerative changes can start after a sudden injury. Growth of bone spurs can compress the nerve roots and cause pain.  SYMPTOMS  The symptoms vary from person to person. Some people may have no pain, while others have severe pain. The pain may be so severe that it can limit your activities. The location of the pain depends on the part of your backbone that is affected. You will have neck or arm pain if a disc in the neck area is affected. You will have pain in your back, buttocks, or legs if a  disc in the lower back is affected. The pain becomes worse while bending, reaching up, or with twisting movements. The pain may start gradually and then get worse as time passes. It may also start after a major or minor injury. You may feel numbness or tingling in the arms or legs.  DIAGNOSIS  Your caregiver will ask you about your symptoms and about activities or habits that may cause the pain. He or she may also ask about any injuries, diseases, ortreatments you have had earlier. Your caregiver will examine you to check for the range of movement that is possible in the affected area, to check for strength in your extremities, and to check for sensation in the areas of the arms and legs supplied by different nerve roots. An X-Langford of the spine may be taken. Your caregiver may suggest other imaging tests, such as a computerized magnetic scan (MRI), if needed.  TREATMENT  Treatment includes rest, modifying your activities, and applying ice and heat. Your caregiver may prescribe medicines to reduce your pain and may ask you to do some exercises to strengthen your back. In some cases, you may need surgery. You and your caregiver will decide on the treatment that is best for you. HOME CARE INSTRUCTIONS   Follow proper lifting and walking techniques as advised by your caregiver.   Maintain good posture.   Exercise regularly as advised.   Perform relaxation exercises.   Change your sitting,   standing, and sleeping habits as advised. Change positions frequently.   Lose weight as advised.   Stop smoking if you smoke.   Wear supportive footwear.  SEEK MEDICAL CARE IF:  The pain does not go away within 1 to 4 weeks. SEEK IMMEDIATE MEDICAL CARE IF:   The pain is severe.   You notice weakness in your arms, hands, or legs.   You begin to lose control of your bladder or bowel.  MAKE SURE YOU:   Understand these instructions.   Will watch your condition.   Will get help right away if you are not  doing well or get worse.  Document Released: 10/02/2007 Document Revised: 11/24/2011 Document Reviewed: 10/02/2007 Exit[employer] Patient Information 2012 [employer]. 

## 2012-05-01 NOTE — ED Notes (Signed)
Pt reports neck pain and headache for couple weeks- also having headaches- scheduled for MRI later today

## 2012-05-04 ENCOUNTER — Telehealth: Payer: Self-pay | Admitting: Internal Medicine

## 2012-05-04 DIAGNOSIS — R51 Headache: Secondary | ICD-10-CM

## 2012-05-04 DIAGNOSIS — M542 Cervicalgia: Secondary | ICD-10-CM

## 2012-05-04 NOTE — Telephone Encounter (Signed)
Spoke with son.

## 2012-05-04 NOTE — Telephone Encounter (Signed)
Patient's son Cynthia Berger called stating that he would like to have a call back with his mom's mri results and there is a DPR signed. Please assist.

## 2012-05-08 ENCOUNTER — Telehealth: Payer: Self-pay | Admitting: *Deleted

## 2012-05-08 NOTE — Telephone Encounter (Signed)
Left message for pt, monitor reviewed by dr crenshaw shows sinus. 

## 2012-05-16 ENCOUNTER — Ambulatory Visit (INDEPENDENT_AMBULATORY_CARE_PROVIDER_SITE_OTHER): Payer: Commercial Managed Care - PPO | Admitting: Cardiology

## 2012-05-16 ENCOUNTER — Encounter: Payer: Self-pay | Admitting: Cardiology

## 2012-05-16 VITALS — BP 146/88 | HR 64 | Ht 63.0 in | Wt 160.0 lb

## 2012-05-16 DIAGNOSIS — IMO0001 Reserved for inherently not codable concepts without codable children: Secondary | ICD-10-CM

## 2012-05-16 DIAGNOSIS — R87610 Atypical squamous cells of undetermined significance on cytologic smear of cervix (ASC-US): Secondary | ICD-10-CM

## 2012-05-16 DIAGNOSIS — I428 Other cardiomyopathies: Secondary | ICD-10-CM

## 2012-05-16 DIAGNOSIS — I429 Cardiomyopathy, unspecified: Secondary | ICD-10-CM | POA: Insufficient documentation

## 2012-05-16 DIAGNOSIS — R931 Abnormal findings on diagnostic imaging of heart and coronary circulation: Secondary | ICD-10-CM

## 2012-05-16 DIAGNOSIS — R9389 Abnormal findings on diagnostic imaging of other specified body structures: Secondary | ICD-10-CM

## 2012-05-16 NOTE — Assessment & Plan Note (Signed)
Symptoms have resolved on beta-blockade. Continue present medications. Monitor shows no significant arrhythmia. No further workup at this time.

## 2012-05-16 NOTE — Assessment & Plan Note (Signed)
Echocardiogram suggests mildly reduced LV function. Plan MUGA to fully quantitate.

## 2012-05-16 NOTE — Patient Instructions (Signed)
Your physician recommends that you schedule a follow-up appointment in: AS NEEDED PENDING TEST RESULTS  Your physician has requested that you have a MUGA: A multigated acquisition (MUGA) scan is a test that looks at the chambers and blood vessels of the heart. Please see the CareNotes handout/brochure given to you today for further information.

## 2012-05-16 NOTE — Progress Notes (Signed)
Addended by: Freddi Starr on: 05/16/2012 09:12 AM   Modules accepted: Orders

## 2012-05-16 NOTE — Progress Notes (Signed)
   HPI: 56 year-old female I initially saw in April of 2013 for evaluation of palpitations. 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 No aneurysm or left renal artery stenosis. TSH and potassium in March of 2013 normal. Echocardiogram in April of 2013 showed an ejection fraction of 45-50%. CardioNet showed sinus rhythm. Since she was last seen, she denies dyspnea or syncope. Her palpitations have improved. She occasionally has a brief pain in her chest for one to 2 seconds but no exertional chest pain.   Current Outpatient Prescriptions  Medication Sig Dispense Refill  . esomeprazole (NEXIUM) 40 MG capsule Take 1 capsule (40 mg total) by mouth daily.  30 capsule  1  . Multiple Vitamins-Minerals (MULTIVITAMIN PO) Take by mouth.      . nebivolol (BYSTOLIC) 5 MG tablet Take 0.5 tablets (2.5 mg total) by mouth daily.  30 tablet  5  . DISCONTD: dicyclomine (BENTYL) 20 MG tablet Take 1 tablet (20 mg total) by mouth 2 (two) times daily.  40 tablet  0     Past Medical History  Diagnosis Date  . Fibroid     ASYMPTOMATIC  . 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 ASCUS (atypical squamous cells of undetermined significance) on Pap smear 01/2011    C&B -INADEQUATE ECC  . Vitamin d deficiency 11/2007    LOW AT 18  . AC (acromioclavicular) joint bone spurs     HEEL BONE SPURS-SURGERY 12/2010 LEFT FOOT  . History of kidney cancer 13-Jun-2004  . Death of family member     HUSBAND DIED IN 2008/06/13  . Antral gastritis   . Hiatal hernia   . Positive H. pylori test   . Uterine fibroid   . GERD (gastroesophageal reflux disease)   . Hypertension   . Thyroid nodule     Past Surgical History  Procedure Date  . Cesarean section 1993     BTL AT THAT TIME  . Tubal ligation 1993     AT TIME OF C/S  . Nephrectomy 2005    KIDNEY CANCER  . Rotator cuff repair 2010    right  . Foot surgery 2011-06-14    left  heel spur removed    History   Social History  . Marital Status: Single    Spouse Name: N/A    Number of Children: 4  . Years of Education: N/A   Occupational History  .     Social History Main Topics  . Smoking status: Never Smoker   . Smokeless tobacco: Never Used  . Alcohol Use: Yes     occasional  . Drug Use: No  . Sexually Active: Yes -- Female partner(s)    Birth Control/ Protection: Post-menopausal   Other Topics Concern  . Not on file   Social History Narrative  . No narrative on file    ROS: no fevers or chills, productive cough, hemoptysis, dysphasia, odynophagia, melena, hematochezia, dysuria, hematuria, rash, seizure activity, orthopnea, PND, pedal edema, claudication. Remaining systems are negative.  Physical Exam: Well-developed well-nourished in no acute distress.  Skin is warm and dry.  HEENT is normal.  Neck is supple. Chest is clear to auscultation with normal expansion.  Cardiovascular exam is regular rate and rhythm.  Abdominal exam nontender or distended. No masses palpated. Extremities show trace edema. neuro grossly intact

## 2012-05-16 NOTE — Assessment & Plan Note (Signed)
Blood pressure mildly elevated but she follows this closely at home and it is typically 120/50. Continue present medications.

## 2012-05-21 ENCOUNTER — Ambulatory Visit (HOSPITAL_COMMUNITY): Payer: Commercial Managed Care - PPO

## 2012-05-23 ENCOUNTER — Ambulatory Visit (HOSPITAL_COMMUNITY): Payer: Commercial Managed Care - PPO | Attending: Cardiovascular Disease | Admitting: Radiology

## 2012-05-23 DIAGNOSIS — I43 Cardiomyopathy in diseases classified elsewhere: Secondary | ICD-10-CM

## 2012-05-23 DIAGNOSIS — I10 Essential (primary) hypertension: Secondary | ICD-10-CM | POA: Insufficient documentation

## 2012-05-23 DIAGNOSIS — R931 Abnormal findings on diagnostic imaging of heart and coronary circulation: Secondary | ICD-10-CM

## 2012-05-23 DIAGNOSIS — R079 Chest pain, unspecified: Secondary | ICD-10-CM | POA: Insufficient documentation

## 2012-05-23 NOTE — Progress Notes (Signed)
Muga Study  Referring Provider:  Lewayne Bunting, MD  Date of Procedure: 05/23/2012  Indication: Evaluate LVF, 4/13 Echo: EF=45-50% Chest pain History HTN IV 22G angiocath (L) AC x 1, site unremarkable, tolerated well.Patsy Edwards,RN.  Muga Information:  The patient's red blood cells were labeled using the Ultra Tag method with of Technetium 62m Pertechnetate.  The images were reconstructed in the Anterior, Lateral and Left Anterior oblique views.  Impression: The LV EF is 68%,   Vesta Mixer, Montez Hageman., MD, [facility] 05/23/2012, 5:43 PM Office - [phone] Pager [phone]

## 2012-05-30 ENCOUNTER — Telehealth: Payer: Self-pay | Admitting: Gynecology

## 2012-05-30 NOTE — Telephone Encounter (Signed)
Patient left a message in my voice mail saying she has been cleared for surgery and she is ready to schedule.  I called her back and left message for her to call me.

## 2012-05-31 ENCOUNTER — Telehealth: Payer: Self-pay | Admitting: Gynecology

## 2012-05-31 ENCOUNTER — Telehealth: Payer: Self-pay | Admitting: Cardiology

## 2012-05-31 MED ORDER — MISOPROSTOL 200 MCG PO TABS: ORAL_TABLET | ORAL | Status: DC

## 2012-05-31 NOTE — Telephone Encounter (Signed)
Ok fo surgery Olga Millers

## 2012-05-31 NOTE — Telephone Encounter (Signed)
Patient confirmed surgery date 6/21 7:30am.  She was scheduled for pre-op consult with Dr. Velvet Bathe for Monday, June 17 3:30pm.  She said she has already talked with Dr. Ludwig Clarks office (cardiologist) and they are supposed to be faxing her medical clearance note today or tomorrow.  I e-scribed her Cytotec tablet.

## 2012-05-31 NOTE — Telephone Encounter (Signed)
Will forward for dr crenshaw review  

## 2012-05-31 NOTE — Telephone Encounter (Signed)
Faxed to the number provided

## 2012-05-31 NOTE — Telephone Encounter (Signed)
New msg Pt wants to get ok for procedure she is to have done on Friday by Dr Mila Homer gyn. Their fax number is [phone]

## 2012-06-04 ENCOUNTER — Ambulatory Visit (INDEPENDENT_AMBULATORY_CARE_PROVIDER_SITE_OTHER): Payer: Commercial Managed Care - PPO | Admitting: Gynecology

## 2012-06-04 ENCOUNTER — Encounter: Payer: Self-pay | Admitting: Gynecology

## 2012-06-04 VITALS — BP 132/90

## 2012-06-04 DIAGNOSIS — D259 Leiomyoma of uterus, unspecified: Secondary | ICD-10-CM

## 2012-06-04 DIAGNOSIS — N950 Postmenopausal bleeding: Secondary | ICD-10-CM

## 2012-06-04 NOTE — Patient Instructions (Signed)
Followup for surgery as scheduled. 

## 2012-06-04 NOTE — H&P (Signed)
  Cynthia Berger 1956/06/02 [id number]   History and Physical   Chief complaint: Postcoital bleeding, postmenopausal. Leiomyoma.  History of present illness: 56 y.o.  G.6 P4 Ab2 postmenopausal patient with history of post coital spotting. Sonohysterogram should endometrial echo at 5.2 mm with multiple myomas the largest measuring 44 mm. There is some displacement of the endometrial cavity by a myoma but no gross intracavitary abnormalities.  Endometrial sample returned no endometrial tissue. Options for management were reviewed and ultimately we decided on hysteroscopy D&C for better endometrial evaluation.   Past medical history,surgical history, medications, allergies, family history and social history were all reviewed and documented in the EPIC chart. ROS:  Was performed and pertinent positives and negatives are included in the history of present illness.  Exam: General: well developed, well nourished female, no acute distress HEENT: normal  Lungs: clear to auscultation without wheezing, rales or rhonchi  Cardiac: regular rate without rubs, murmurs or gallops  Abdomen: soft, nontender without masses, guarding, rebound, organomegaly  Pelvic: external bus vagina: normal   Cervix: grossly normal  Uterus: mildly irregular generous in size consistent with leiomyoma  Adnexa: without masses or tenderness      Assessment and plan:  Postmenopausal postcoital spotting. Multiple myomas. Endometrial cavity difficult to evaluate due to distortion from leiomyoma.  Biopsy at the time of sonohysterogram showed no endometrial tissue. Options for management reviewed to include no further workup, blind rebiopsy, and hysteroscopy with D&C/resection submucous myoma. As the bleeding is becoming more consistent postmenopausal I feel a more complete evaluation is warranted as does the patient and we will proceed with a hysteroscopy D&C. What is involved with the procedure to include instrumentation,  dilatation and curettage portion with use of the resectoscope. The risks of infection, prolonged antibiotics, hemorrhage necessitating transfusion and the risks of transfusion to include transfusion reaction, hepatitis, HIV, mad cow disease and other unknown entities. The risk of uterine perforation with damage to internal organs including bowel, bladder, ureters, nerves requiring major exploratory reparative surgeries and future reparative surgeries including bowel resection, ureteral damage repair, bladder repair and ostomy formation was all discussed understood and accepted. The risk of distended media absorption leading to metabolic complications including coma and seizures was also discussed with her. Patient has been evaluated by her medical physicians and is cleared for surgery.     Dara Lords MD, 4:00 PM 06/04/2012

## 2012-06-04 NOTE — Progress Notes (Signed)
Cynthia Berger October 15, 1956 [id number]   Preoperative consult  Chief complaint: Postcoital bleeding, postmenopausal. Leiomyoma.  History of present illness: 56 y.o.  G.6 P4 Ab2 postmenopausal patient with history of post coital spotting. Sonohysterogram should endometrial echo at 5.2 mm with multiple myomas the largest measuring 44 mm. There is some displacement of the endometrial cavity by a myoma but no gross intracavitary abnormalities.  Endometrial sample returned no endometrial tissue. Options for management were reviewed and ultimately we decided on hysteroscopy D&C for better endometrial evaluation.   Past medical history,surgical history, medications, allergies, family history and social history were all reviewed and documented in the EPIC chart. ROS:  Was performed and pertinent positives and negatives are included in the history of present illness.  Exam: General: well developed, well nourished female, no acute distress HEENT: normal  Lungs: clear to auscultation without wheezing, rales or rhonchi  Cardiac: regular rate without rubs, murmurs or gallops  Abdomen: soft, nontender without masses, guarding, rebound, organomegaly  Pelvic: external bus vagina: normal   Cervix: grossly normal  Uterus: mildly irregular generous in size consistent with leiomyoma  Adnexa: without masses or tenderness      Assessment and plan:  Postmenopausal postcoital spotting. Multiple myomas. Endometrial cavity difficult to evaluate due to distortion from leiomyoma.  Biopsy at the time of sonohysterogram showed no endometrial tissue. Options for management reviewed to include no further workup, blind rebiopsy, and hysteroscopy with D&C/resection submucous myoma. As the bleeding is becoming more consistent postmenopausal I feel a more complete evaluation is warranted as does the patient and we will proceed with a hysteroscopy D&C. What is involved with the procedure to include instrumentation, dilatation  and curettage portion with use of the resectoscope. The risks of infection, prolonged antibiotics, hemorrhage necessitating transfusion and the risks of transfusion to include transfusion reaction, hepatitis, HIV, mad cow disease and other unknown entities. The risk of uterine perforation with damage to internal organs including bowel, bladder, ureters, nerves requiring major exploratory reparative surgeries and future reparative surgeries including bowel resection, ureteral damage repair, bladder repair and ostomy formation was all discussed understood and accepted. The risk of distended media absorption leading to metabolic complications including coma and seizures was also discussed with her. Patient has been evaluated by her medical physicians and is cleared for surgery.    Dara Lords MD, 3:48 PM 06/04/2012

## 2012-06-05 ENCOUNTER — Encounter (HOSPITAL_BASED_OUTPATIENT_CLINIC_OR_DEPARTMENT_OTHER): Payer: Self-pay | Admitting: *Deleted

## 2012-06-05 LAB — CBC
HCT: 38.5 % (ref 36.0–46.0)
MCV: 90.2 fL (ref 78.0–100.0)
Platelets: 238 10*3/uL (ref 150–400)
RBC: 4.27 MIL/uL (ref 3.87–5.11)
WBC: 6.6 10*3/uL (ref 4.0–10.5)

## 2012-06-05 LAB — COMPREHENSIVE METABOLIC PANEL
Albumin: 3.7 g/dL (ref 3.5–5.2)
BUN: 18 mg/dL (ref 6–23)
Creatinine, Ser: 1.56 mg/dL — ABNORMAL HIGH (ref 0.50–1.10)
Total Protein: 7.3 g/dL (ref 6.0–8.3)

## 2012-06-05 NOTE — Progress Notes (Signed)
NPO AFTER MN. ARRIVES AT 0615. CBC AND BMET TO BE DONE TODAY. CURRENT EKG IN EPIC AND CHART. WILL TAKE NEXIUM AND BYSTOLIC AM OF SURG W/ SIP OF WATER.

## 2012-06-08 ENCOUNTER — Encounter (HOSPITAL_BASED_OUTPATIENT_CLINIC_OR_DEPARTMENT_OTHER)
Admission: RE | Disposition: A | Discharge status: Home / Self Care | Payer: Self-pay | Source: Ambulatory Visit | Attending: Gynecology

## 2012-06-08 ENCOUNTER — Ambulatory Visit (HOSPITAL_BASED_OUTPATIENT_CLINIC_OR_DEPARTMENT_OTHER)
Admission: RE | Admit: 2012-06-08 | Discharge: 2012-06-08 | Disposition: A | Discharge status: Home / Self Care | Payer: Commercial Managed Care - PPO | Source: Ambulatory Visit | Attending: Gynecology | Admitting: Gynecology

## 2012-06-08 ENCOUNTER — Ambulatory Visit (HOSPITAL_BASED_OUTPATIENT_CLINIC_OR_DEPARTMENT_OTHER): Payer: Commercial Managed Care - PPO | Admitting: Anesthesiology

## 2012-06-08 ENCOUNTER — Encounter (HOSPITAL_BASED_OUTPATIENT_CLINIC_OR_DEPARTMENT_OTHER): Payer: Self-pay | Admitting: Anesthesiology

## 2012-06-08 ENCOUNTER — Encounter (HOSPITAL_BASED_OUTPATIENT_CLINIC_OR_DEPARTMENT_OTHER): Payer: Self-pay | Admitting: *Deleted

## 2012-06-08 DIAGNOSIS — D250 Submucous leiomyoma of uterus: Secondary | ICD-10-CM

## 2012-06-08 DIAGNOSIS — D259 Leiomyoma of uterus, unspecified: Secondary | ICD-10-CM | POA: Insufficient documentation

## 2012-06-08 DIAGNOSIS — I10 Essential (primary) hypertension: Secondary | ICD-10-CM | POA: Insufficient documentation

## 2012-06-08 DIAGNOSIS — Z9889 Other specified postprocedural states: Secondary | ICD-10-CM

## 2012-06-08 DIAGNOSIS — N924 Excessive bleeding in the premenopausal period: Secondary | ICD-10-CM | POA: Insufficient documentation

## 2012-06-08 DIAGNOSIS — Z79899 Other long term (current) drug therapy: Secondary | ICD-10-CM | POA: Insufficient documentation

## 2012-06-08 DIAGNOSIS — N950 Postmenopausal bleeding: Secondary | ICD-10-CM

## 2012-06-08 DIAGNOSIS — K449 Diaphragmatic hernia without obstruction or gangrene: Secondary | ICD-10-CM | POA: Insufficient documentation

## 2012-06-08 DIAGNOSIS — K219 Gastro-esophageal reflux disease without esophagitis: Secondary | ICD-10-CM | POA: Insufficient documentation

## 2012-06-08 HISTORY — DX: Other cervical disc degeneration, unspecified cervical region: M50.30

## 2012-06-08 HISTORY — PX: HYSTEROSCOPY WITH D & C: SHX1775

## 2012-06-08 HISTORY — DX: Palpitations: R00.2

## 2012-06-08 SURGERY — DILATATION AND CURETTAGE /HYSTEROSCOPY
Anesthesia: Monitor Anesthesia Care | Site: Uterus | Wound class: Clean Contaminated

## 2012-06-08 MED ORDER — CIPROFLOXACIN IN D5W 400 MG/200ML IV SOLN
400.0000 mg | INTRAVENOUS | Status: AC
  Administered 2012-06-08: 400 mg via INTRAVENOUS

## 2012-06-08 MED ORDER — LACTATED RINGERS IV SOLN: INTRAVENOUS | Status: DC

## 2012-06-08 MED ORDER — FENTANYL CITRATE 0.05 MG/ML IJ SOLN
INTRAMUSCULAR | Status: DC | PRN
  Administered 2012-06-08 (×2): 25 ug via INTRAVENOUS
  Administered 2012-06-08: 50 ug via INTRAVENOUS
  Administered 2012-06-08: 25 ug via INTRAVENOUS
  Administered 2012-06-08: 50 ug via INTRAVENOUS
  Administered 2012-06-08: 25 ug via INTRAVENOUS

## 2012-06-08 MED ORDER — PROPOFOL 10 MG/ML IV EMUL
INTRAVENOUS | Status: DC | PRN
  Administered 2012-06-08: 160 mg via INTRAVENOUS

## 2012-06-08 MED ORDER — OXYCODONE HCL 5 MG PO TABS: 5.0000 mg | ORAL_TABLET | ORAL | Status: AC | PRN

## 2012-06-08 MED ORDER — PROMETHAZINE HCL 25 MG/ML IJ SOLN: 6.2500 mg | INTRAMUSCULAR | Status: DC | PRN

## 2012-06-08 MED ORDER — MIDAZOLAM HCL 5 MG/5ML IJ SOLN
INTRAMUSCULAR | Status: DC | PRN
  Administered 2012-06-08: 1 mg via INTRAVENOUS

## 2012-06-08 MED ORDER — SILVER NITRATE-POT NITRATE 75-25 % EX MISC
CUTANEOUS | Status: DC | PRN
  Administered 2012-06-08: 4

## 2012-06-08 MED ORDER — GLYCINE 1.5 % IR SOLN
Status: DC | PRN
  Administered 2012-06-08: 6000 mL

## 2012-06-08 MED ORDER — FENTANYL CITRATE 0.05 MG/ML IJ SOLN: 25.0000 ug | INTRAMUSCULAR | Status: DC | PRN

## 2012-06-08 MED ORDER — SODIUM CHLORIDE 0.9 % IR SOLN
Status: DC | PRN
  Administered 2012-06-08: 500 mL

## 2012-06-08 MED ORDER — DEXAMETHASONE SODIUM PHOSPHATE 4 MG/ML IJ SOLN
INTRAMUSCULAR | Status: DC | PRN
  Administered 2012-06-08: 10 mg via INTRAVENOUS

## 2012-06-08 MED ORDER — MEPERIDINE HCL 25 MG/ML IJ SOLN: 6.2500 mg | INTRAMUSCULAR | Status: DC | PRN

## 2012-06-08 MED ORDER — EPHEDRINE SULFATE 50 MG/ML IJ SOLN
INTRAMUSCULAR | Status: DC | PRN
  Administered 2012-06-08: 10 mg via INTRAVENOUS

## 2012-06-08 MED ORDER — GLYCOPYRROLATE 0.2 MG/ML IJ SOLN
INTRAMUSCULAR | Status: DC | PRN
  Administered 2012-06-08: 0.2 mg via INTRAVENOUS

## 2012-06-08 MED ORDER — CLINDAMYCIN PHOSPHATE 900 MG/50ML IV SOLN
900.0000 mg | INTRAVENOUS | Status: AC
  Administered 2012-06-08: 900 mg via INTRAVENOUS

## 2012-06-08 MED ORDER — LIDOCAINE HCL 1 % IJ SOLN
INTRAMUSCULAR | Status: DC | PRN
  Administered 2012-06-08: 10 mL

## 2012-06-08 MED ORDER — ONDANSETRON HCL 4 MG/2ML IJ SOLN
INTRAMUSCULAR | Status: DC | PRN
  Administered 2012-06-08: 4 mg via INTRAVENOUS

## 2012-06-08 MED ORDER — LACTATED RINGERS IV SOLN
INTRAVENOUS | Status: DC
  Administered 2012-06-08 (×3): via INTRAVENOUS

## 2012-06-08 MED ORDER — LIDOCAINE HCL (CARDIAC) 20 MG/ML IV SOLN
INTRAVENOUS | Status: DC | PRN
  Administered 2012-06-08: 80 mg via INTRAVENOUS

## 2012-06-08 SURGICAL SUPPLY — 33 items
BAG DECANTER FOR FLEXI CONT (MISCELLANEOUS) IMPLANT
CANISTER SUCTION 2500CC (MISCELLANEOUS) ×2 IMPLANT
CATH ROBINSON RED A/P 16FR (CATHETERS) ×2 IMPLANT
CLOTH BEACON ORANGE TIMEOUT ST (SAFETY) ×2 IMPLANT
CORD ACTIVE DISPOSABLE (ELECTRODE) ×1
CORD ELECTRO ACTIVE DISP (ELECTRODE) ×1 IMPLANT
COVER TABLE BACK 60X90 (DRAPES) ×2 IMPLANT
DRAPE CAMERA CLOSED 9X96 (DRAPES) ×2 IMPLANT
DRAPE LG THREE QUARTER DISP (DRAPES) ×2 IMPLANT
DRESSING TELFA 8X3 (GAUZE/BANDAGES/DRESSINGS) ×2 IMPLANT
ELECT LOOP GYNE PRO 24FR (CUTTING LOOP) ×2
ELECT REM PT RETURN 9FT ADLT (ELECTROSURGICAL) ×4
ELECT VAPORTRODE GRVD BAR (ELECTRODE) IMPLANT
ELECTRODE LOOP GYNE PRO 24FR (CUTTING LOOP) ×1 IMPLANT
ELECTRODE REM PT RTRN 9FT ADLT (ELECTROSURGICAL) ×1 IMPLANT
GLOVE BIO SURGEON STRL SZ7.5 (GLOVE) ×4 IMPLANT
GOWN W/COTTON TOWEL STD LRG (GOWNS) ×2 IMPLANT
GOWN XL W/COTTON TOWEL STD (GOWNS) ×2 IMPLANT
JUMPSUIT BLUE BOOT COVER DISP (PROTECTIVE WEAR) ×2 IMPLANT
LEGGING LITHOTOMY PAIR STRL (DRAPES) ×2 IMPLANT
NDL SAFETY ECLIPSE 18X1.5 (NEEDLE) IMPLANT
NDL SPNL 22GX3.5 QUINCKE BK (NEEDLE) ×1 IMPLANT
NEEDLE HYPO 18GX1.5 SHARP (NEEDLE)
NEEDLE SPNL 22GX3.5 QUINCKE BK (NEEDLE) ×2 IMPLANT
PACK BASIN DAY SURGERY FS (CUSTOM PROCEDURE TRAY) ×2 IMPLANT
PAD OB MATERNITY 4.3X12.25 (PERSONAL CARE ITEMS) ×2 IMPLANT
PAD PREP 24X48 CUFFED NSTRL (MISCELLANEOUS) ×2 IMPLANT
SYR CONTROL 10ML LL (SYRINGE) ×2 IMPLANT
SYR TB 1ML LL NO SAFETY (SYRINGE) IMPLANT
TOWEL OR 17X24 6PK STRL BLUE (TOWEL DISPOSABLE) ×4 IMPLANT
TRAY DSU PREP LF (CUSTOM PROCEDURE TRAY) ×2 IMPLANT
TUBING HYDROFLEX HYSTEROSCOPY (TUBING) ×2 IMPLANT
WATER STERILE IRR 500ML POUR (IV SOLUTION) ×2 IMPLANT

## 2012-06-08 NOTE — Transfer of Care (Signed)
Immediate Anesthesia Transfer of Care Note  Patient: Cynthia Berger  Procedure(s) Performed: Procedure(s) (LRB): DILATATION AND CURETTAGE /HYSTEROSCOPY (N/A)  Patient Location: Patient transported to PACU with oxygen via face mask at 4 Liters / Min  Anesthesia Type: General  Level of Consciousness: awake and alert   Airway & Oxygen Therapy: Patient Spontanous Breathing and Patient connected to face mask oxygen  Post-op Assessment: Report given to PACU RN and Post -op Vital signs reviewed and stable  Post vital signs: Reviewed and stable  Dentition: Teeth and oropharynx remain in pre-op condition  Complications: No apparent anesthesia complications

## 2012-06-08 NOTE — Anesthesia Postprocedure Evaluation (Signed)
  Anesthesia Post-op Note  Patient: Cynthia Berger  Procedure(s) Performed: Procedure(s) (LRB): DILATATION AND CURETTAGE /HYSTEROSCOPY (N/A)  Patient Location: PACU  Anesthesia Type: General  Level of Consciousness: awake and alert   Airway and Oxygen Therapy: Patient Spontanous Breathing  Post-op Pain: mild  Post-op Assessment: Post-op Vital signs reviewed, Patient's Cardiovascular Status Stable, Respiratory Function Stable, Patent Airway and No signs of Nausea or vomiting  Post-op Vital Signs: stable  Complications: No apparent anesthesia complications

## 2012-06-08 NOTE — Discharge Instructions (Signed)
° °  Postoperative Instructions Hysteroscopy D & C ° °Dr. Fontaine and the nursing staff have discussed postoperative instructions with you.  If you have any questions please ask them before you leave the hospital, or call Dr Fontaine’s office at [phone].   ° °We would like to emphasize the following instructions: ° ° °? Call the office to make your follow-up appointment as recommended by Dr Fontaine (usually 1-2 weeks). ° °? You were given a prescription, or one was ordered for you at the pharmacy you designated.  Get that prescription filled and take the medication according to instructions. ° °? You may eat a regular diet, but slowly until you start having bowel movements. ° °? Drink plenty of water daily. ° °? Nothing in the vagina (intercourse, douching, objects of any kind) for two weeks.  When reinitiating intercourse, if it is uncomfortable, stop and make an appointment with Dr Fontaine to be evaluated. ° °? No driving for one to two days until the effects of anesthesia has worn off.  No traveling out of town for several days. ° °? You may shower, but no baths for one week.  Walking up and down stairs is ok.  No heavy lifting, prolonged standing, repeated bending or any “working out” until your post op check. ° °? Rest frequently, listen to your body and do not push yourself and overdo it. ° °? Call if: ° °o Your pain medication does not seem strong enough. °o Worsening pain or abdominal bloating °o Persistent nausea or vomiting °o Difficulty with urination or bowel movements. °o Temperature of 101 degrees or higher. °o Heavy vaginal bleeding.  If your period is due, you may use tampons. °o You have any questions or concerns ° ° ° °Post Anesthesia Home Care Instructions ° °Activity: °Get plenty of rest for the remainder of the day. A responsible adult should stay with you for 24 hours following the procedure.  °For the next 24 hours, DO NOT: °-Drive a car °-Operate machinery °-Drink alcoholic  beverages °-Take any medication unless instructed by your physician °-Make any legal decisions or sign important papers. ° °Meals: °Start with liquid foods such as gelatin or soup. Progress to regular foods as tolerated. Avoid greasy, spicy, heavy foods. If nausea and/or vomiting occur, drink only clear liquids until the nausea and/or vomiting subsides. Call your physician if vomiting continues. ° °Special Instructions/Symptoms: °Your throat may feel dry or sore from the anesthesia or the breathing tube placed in your throat during surgery. If this causes discomfort, gargle with warm salt water. The discomfort should disappear within 24 hours. ° °

## 2012-06-08 NOTE — Anesthesia Preprocedure Evaluation (Addendum)
Anesthesia Evaluation  Patient identified by MRN, date of birth, ID band Patient awake    Reviewed: Allergy & Precautions, H&P , NPO status , Patient's Chart, lab work & pertinent test results  Airway Mallampati: II TM Distance: >3 FB Neck ROM: Full    Dental No notable dental hx.    Pulmonary neg pulmonary ROS,  breath sounds clear to auscultation  Pulmonary exam normal       Cardiovascular hypertension, Pt. on medications and Pt. on home beta blockers negative cardio ROS  Rhythm:Regular Rate:Normal     Neuro/Psych negative neurological ROS  negative psych ROS   GI/Hepatic negative GI ROS, Neg liver ROS, hiatal hernia, GERD-  ,  Endo/Other  negative endocrine ROS  Renal/GU negative Renal ROS  negative genitourinary   Musculoskeletal negative musculoskeletal ROS (+)   Abdominal   Peds negative pediatric ROS (+)  Hematology negative hematology ROS (+)   Anesthesia Other Findings   Reproductive/Obstetrics negative OB ROS                           Anesthesia Physical Anesthesia Plan  ASA: II  Anesthesia Plan: General   Post-op Pain Management:    Induction: Intravenous  Airway Management Planned: LMA  Additional Equipment:   Intra-op Plan:   Post-operative Plan: Extubation in OR  Informed Consent: I have reviewed the patients History and Physical, chart, labs and discussed the procedure including the risks, benefits and alternatives for the proposed anesthesia with the patient or authorized representative who has indicated his/her understanding and acceptance.   Dental advisory given  Plan Discussed with: CRNA  Anesthesia Plan Comments:       Anesthesia Quick Evaluation

## 2012-06-08 NOTE — H&P (Signed)
The patient was examined.  I reviewed the proposed surgery and consent form with the patient.  The dictated history and physical is current and accurate and all questions were answered. The patient is ready to proceed with surgery and has a realistic understanding and expectation for the outcome.   Dara Lords MD, 7:22 AM 06/08/2012

## 2012-06-08 NOTE — Op Note (Signed)
Cynthia Berger Sep 08, 1956 [id number]   Post Operative Note   Date of surgery:  06/08/2012  Pre Op Dx:  Postmenopausal bleeding, leiomyoma  Post Op Dx: Same  Procedure:  Hysteroscopic submucous myomectomy, D&C  Surgeon:  Dara Lords  Anesthesia:  General  EBL:  minimal  Distended media discrepancy:  800 cc  Complications:  None  Specimen:  #1 endometrial curetting #2 submucous myoma fragments to pathology  Findings: EUA:  External BUS vagina normal. Cervix normal. Uterus irregular was normal size midline mobile. Adnexa without masses   Operative:  Hysteroscopic with large submucous myoma distorting the posterior endometrial surface. Endometrial appearance otherwise atrophic. Right and left tubal ostia regions visualized although ostia not clearly seen. Remainder of hysteroscopy normal noting fundus, anterior/posterior uterine surfaces, lower uterine segment, endocervical canal all visualized.  Procedure:  Patient was taken to the operating room, underwent general anesthesia without difficulty, placed in the low dorsal lithotomy position, received a perineal/vaginal preparation with Betadine solution, EUA performed, bladder emptied with an in and out Foley catheterization and the patient was draped in the usual fashion. A time out was performed by the surgical team. The cervix was visualized with a speculum, anterior lip grasped with a single-tooth tenaculum and a paracervical block using 1% lidocaine was placed, a total of 10 cc. The cervix was gently and gradually dilated to admit the operative hysteroscope and hysteroscopy was performed with findings noted above. Using the right angle resectoscopic loop the submucous myoma was resected in multiple passes to the level of the surrounding endometrium. It was felt that the vast majority of myoma was removed with a small rim of remaining myoma left in situ as it remained at the level of the surrounding endometrium and it was felt unwise  to resect deeper.  A sharp curettage was performed and the specimen was sent to pathology separately. Of note there was very scant return consistent with the atrophic endometrial appearance. Rehysteroscopy showed an empty cavity with good distention, good hemostasis and no evidence of perforation. The instruments were removed and the cervix was visualized and silver nitrate was applied to one tenaculum site for ultimate hemostasis. Good hemostasis was visualized at the tenaculum sites and cervical os, the speculum removed and the patient was placed in the supine position, awakened without difficulty and taken to the recovery room in good condition having tolerated the procedure well.  Of note during the procedure the in flow catheter became disconnected from the hysteroscope and there was abundant spillage on the floor and the final estimated glycine discrepancy was estimated by the OR circulating staff.    Dara Lords MD, 11:16 AM 06/08/2012

## 2012-06-08 NOTE — Anesthesia Procedure Notes (Signed)
Procedure Name: LMA Insertion Date/Time: 06/08/2012 7:28 AM Performed by: Fran Lowes Pre-anesthesia Checklist: Patient identified, Emergency Drugs available, Suction available and Patient being monitored Patient Re-evaluated:Patient Re-evaluated prior to inductionOxygen Delivery Method: Circle System Utilized Preoxygenation: Pre-oxygenation with 100% oxygen Intubation Type: IV induction Ventilation: Mask ventilation without difficulty LMA: LMA inserted LMA Size: 4.0 Number of attempts: 1 Airway Equipment and Method: bite block Placement Confirmation: positive ETCO2 Tube secured with: Tape Dental Injury: Teeth and Oropharynx as per pre-operative assessment

## 2012-06-11 ENCOUNTER — Encounter (HOSPITAL_BASED_OUTPATIENT_CLINIC_OR_DEPARTMENT_OTHER): Payer: Self-pay | Admitting: Gynecology

## 2012-06-25 ENCOUNTER — Encounter: Payer: Self-pay | Admitting: Gynecology

## 2012-06-25 ENCOUNTER — Ambulatory Visit (INDEPENDENT_AMBULATORY_CARE_PROVIDER_SITE_OTHER): Payer: Commercial Managed Care - PPO | Admitting: Gynecology

## 2012-06-25 DIAGNOSIS — Z9889 Other specified postprocedural states: Secondary | ICD-10-CM

## 2012-06-25 DIAGNOSIS — D259 Leiomyoma of uterus, unspecified: Secondary | ICD-10-CM

## 2012-06-25 DIAGNOSIS — N950 Postmenopausal bleeding: Secondary | ICD-10-CM

## 2012-06-25 NOTE — Patient Instructions (Signed)
Follow-up in September for your annual exam 

## 2012-06-25 NOTE — Progress Notes (Signed)
Patient presents for postoperative visit status post hysteroscopic resection submucous myoma, D&C. She's done well without complaints.  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 Abdomen: Soft nontender without masses guarding rebound organomegaly. Pelvic: External BUS vagina normal. Cervix normal. Uterus bulky, midline mobile nontender. Adnexa without masses or tenderness.  Assessment and plan: Postmenopausal spotting, submucous myoma status post resection with other leiomyoma. Patient will keep calendar as long as no further bleeding then we'll monitor. She is due for her annual in September and I reminded her to schedule this. I reviewed pathology which showed benign leiomyoma. There was no endometrial tissue retrieved which is consistent with the hysteroscopy which showed an atrophic endometrial pattern.

## 2012-08-03 ENCOUNTER — Telehealth: Payer: Self-pay | Admitting: Internal Medicine

## 2012-08-03 NOTE — Telephone Encounter (Signed)
I should be able to see her at first cancellation, may be there will be one next week.

## 2012-08-03 NOTE — Telephone Encounter (Signed)
Spoke with patient and she reports she is having acid reflux that comes and goes. She has Nexium but has not been taking it regularly. Patient instructed to take Nexium daily. She reports she does elevate HOB, avoids eating several hours before bed time. Hx GERD. (we have not seen her for this)

## 2012-08-06 NOTE — Telephone Encounter (Signed)
Spoke with patient and scheduled her on 08/09/12 at 3:15 PM.

## 2012-08-09 ENCOUNTER — Ambulatory Visit (INDEPENDENT_AMBULATORY_CARE_PROVIDER_SITE_OTHER): Payer: Commercial Managed Care - PPO | Admitting: Internal Medicine

## 2012-08-09 ENCOUNTER — Encounter: Payer: Self-pay | Admitting: Internal Medicine

## 2012-08-09 VITALS — BP 132/80 | HR 68 | Ht 63.75 in | Wt 160.0 lb

## 2012-08-09 DIAGNOSIS — K219 Gastro-esophageal reflux disease without esophagitis: Secondary | ICD-10-CM

## 2012-08-09 MED ORDER — ESOMEPRAZOLE MAGNESIUM 40 MG PO CPDR: 40.0000 mg | DELAYED_RELEASE_CAPSULE | Freq: Every day | ORAL | Status: DC

## 2012-08-09 NOTE — Progress Notes (Signed)
Cynthia Berger [birth year] MRN [id number]        History of Present Illness:  This is a 56 year old African American female with gastroesophageal reflux. She had full evaluation by Dr. Loreta Ave in January 2005 which included  normal upper endoscopy. She was treated for positive  H. pylori antibody. Esophageal manometry was normal and 24-hour intraesophageal. PH probe was also normal. She has chronic renal insufficiency and a goiter. She has a solitary kidney. She has ran out of Nexium which seemed to help her reflux. She has a pill dysphagia. Patient doesn't smoke, drink alcohol or excess coffee. She does not use aspirin or anti-inflammatory agents. Her colonoscopy in December 2008 was normal   Past Medical History  Diagnosis Date  . 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 ASCUS (atypical squamous cells of undetermined significance) on Pap smear 01/2011    C&B -INADEQUATE ECC  . Vitamin d deficiency 11/2007    LOW AT 17  . AC (acromioclavicular) joint bone spurs     HEEL BONE SPURS-SURGERY 12/2010 LEFT FOOT  . Antral gastritis   . Positive H. pylori test   . Uterine fibroid   . GERD (gastroesophageal reflux disease)   . Hypertension   . Thyroid nodule RIGHT LOBE-- PER BX NEGATIVE  . H/O hiatal hernia   . History of kidney cancer 2005    S/P RIGHT NEPHRECTOMY   . PMB (postmenopausal bleeding)   . Heart palpitations   . DDD (degenerative disc disease), cervical    Past Surgical History  Procedure Date  . Cesarean section 1993    W/ TUBAL LIGATION, BILATERAL  . Nephrectomy 2005    RIGHT KIDNEY CANCER  . Rotator cuff repair 2010    RIGHT  . Foot surgery 2012    left heel spur removed  . Transthoracic echocardiogram 04-10-2012  DR BRIEN CRENSHAW    LVSF MILDLY REDUCED/ EF 45-50%  . Hysteroscopy w/d&c 06/08/2012    Procedure: DILATATION AND CURETTAGE /HYSTEROSCOPY;  Surgeon: Dara Lords, MD;  Location:  [facility] [place];  Service: Gynecology;  Laterality: N/A;  WITH RESECTION OF MYOMA    reports that she has never smoked. She has never used smokeless tobacco. She reports that she drinks alcohol. She reports that she does not use illicit drugs. family history includes Hypertension in her mother, sister, and son. Allergies  Allergen Reactions  . Penicillins Rash        Review of Systems: Positive for pill dysphagia. Positive for burning substernally. Negative for odynophagia  The remainder of the 10 point ROS is negative except as outlined in H&P   Physical Exam: General appearance  Well developed, in no distress. Eyes- non icteric. HEENT nontraumatic, normocephalic. Mouth no lesions, tongue papillated, no cheilosis. Neck supple without adenopathy, thyroid enlarged, nontender goiter no carotid bruits, no JVD. Lungs Clear to auscultation bilaterally. Cor normal S1, normal S2, regular rhythm, no murmur,  quiet precordium. Abdomen: Soft nontender with normoactive bowel sounds. Rectal: Not done. Extremities no pedal edema. Skin no lesions. Neurological alert and oriented x 3. Psychological normal mood and affect.  Assessment and Plan:  Problem #1 Chronic gastroesophageal reflux disease occurring mostly in the morning suggesting nocturnal reflux. She had a full evaluation in 2005. She may have a motility disorder at this time because of problems with pills. We will obtain a barium esophagram and upper GI series. We will give her a prescription for Nexium  40 mg at bedtime and will ask her to follow antireflux measures. I will see her in 2 or 3 months for followup on her symptoms.  08/09/2012 Cynthia Berger

## 2012-08-09 NOTE — Patient Instructions (Addendum)
We have sent the following medications to your pharmacy for you to pick up at your convenience: Nexium (at bedtime) You have been scheduled for a Barium Esophogram at [facility] (1st floor of the hospital) on Tuesday, 08/14/12 at 9:30 am. Please arrive 15 minutes prior to your appointment for registration. Make certain not to have anything to eat or drink 6 hours prior to your test. If you need to reschedule for any reason, please contact radiology at [phone] to do so. CC: Dr Artist Pais

## 2012-08-14 ENCOUNTER — Ambulatory Visit (HOSPITAL_COMMUNITY)
Admission: RE | Admit: 2012-08-14 | Discharge: 2012-08-14 | Disposition: A | Discharge status: Home / Self Care | Payer: Commercial Managed Care - PPO | Source: Ambulatory Visit | Attending: Internal Medicine | Admitting: Internal Medicine

## 2012-08-14 DIAGNOSIS — K224 Dyskinesia of esophagus: Secondary | ICD-10-CM | POA: Insufficient documentation

## 2012-08-14 DIAGNOSIS — K219 Gastro-esophageal reflux disease without esophagitis: Secondary | ICD-10-CM

## 2012-08-14 DIAGNOSIS — K449 Diaphragmatic hernia without obstruction or gangrene: Secondary | ICD-10-CM | POA: Insufficient documentation

## 2012-08-15 ENCOUNTER — Other Ambulatory Visit (HOSPITAL_COMMUNITY)
Admission: RE | Admit: 2012-08-15 | Discharge: 2012-08-15 | Disposition: A | Discharge status: Home / Self Care | Payer: Commercial Managed Care - PPO | Source: Ambulatory Visit | Attending: Obstetrics and Gynecology | Admitting: Obstetrics and Gynecology

## 2012-08-15 ENCOUNTER — Ambulatory Visit (INDEPENDENT_AMBULATORY_CARE_PROVIDER_SITE_OTHER): Payer: Commercial Managed Care - PPO | Admitting: Women's Health

## 2012-08-15 ENCOUNTER — Encounter: Payer: Self-pay | Admitting: Women's Health

## 2012-08-15 DIAGNOSIS — Z01419 Encounter for gynecological examination (general) (routine) without abnormal findings: Secondary | ICD-10-CM | POA: Insufficient documentation

## 2012-08-15 DIAGNOSIS — IMO0001 Reserved for inherently not codable concepts without codable children: Secondary | ICD-10-CM

## 2012-08-15 DIAGNOSIS — R87610 Atypical squamous cells of undetermined significance on cytologic smear of cervix (ASC-US): Secondary | ICD-10-CM

## 2012-08-15 NOTE — Progress Notes (Signed)
Patient ID: Cynthia Berger, female   DOB: 1956/10/26, 55 y.o.   MRN: [id number] Presents for Pap. History of LGSIL with negative C&B in 2010, ascus with positive HR HPV 2/ 2012, Paps 08/2011 normal, 02/2012-benign reactive changes. History of right renal cancer in 2005. Asymptomatic fibroids with no bleeding.  Exam: External genitalia within normal limits, speculum exam cervix pink healthy without lesion or discharge. Pap taken, bimanual no CMT or adnexal fullness or tenderness, uterus bulky.  Plan: Triage based on Pap results if normal resume to annual.

## 2012-08-25 IMAGING — CR DG CHEST 2V
2 series · 2 of 2 positions shown · non-contrast
Comparison: None.

CLINICAL DATA: History of syncopal episode.  Dizziness.  Weakness.
Tachycardia.

CHEST - 2 VIEW

[w chest pa]
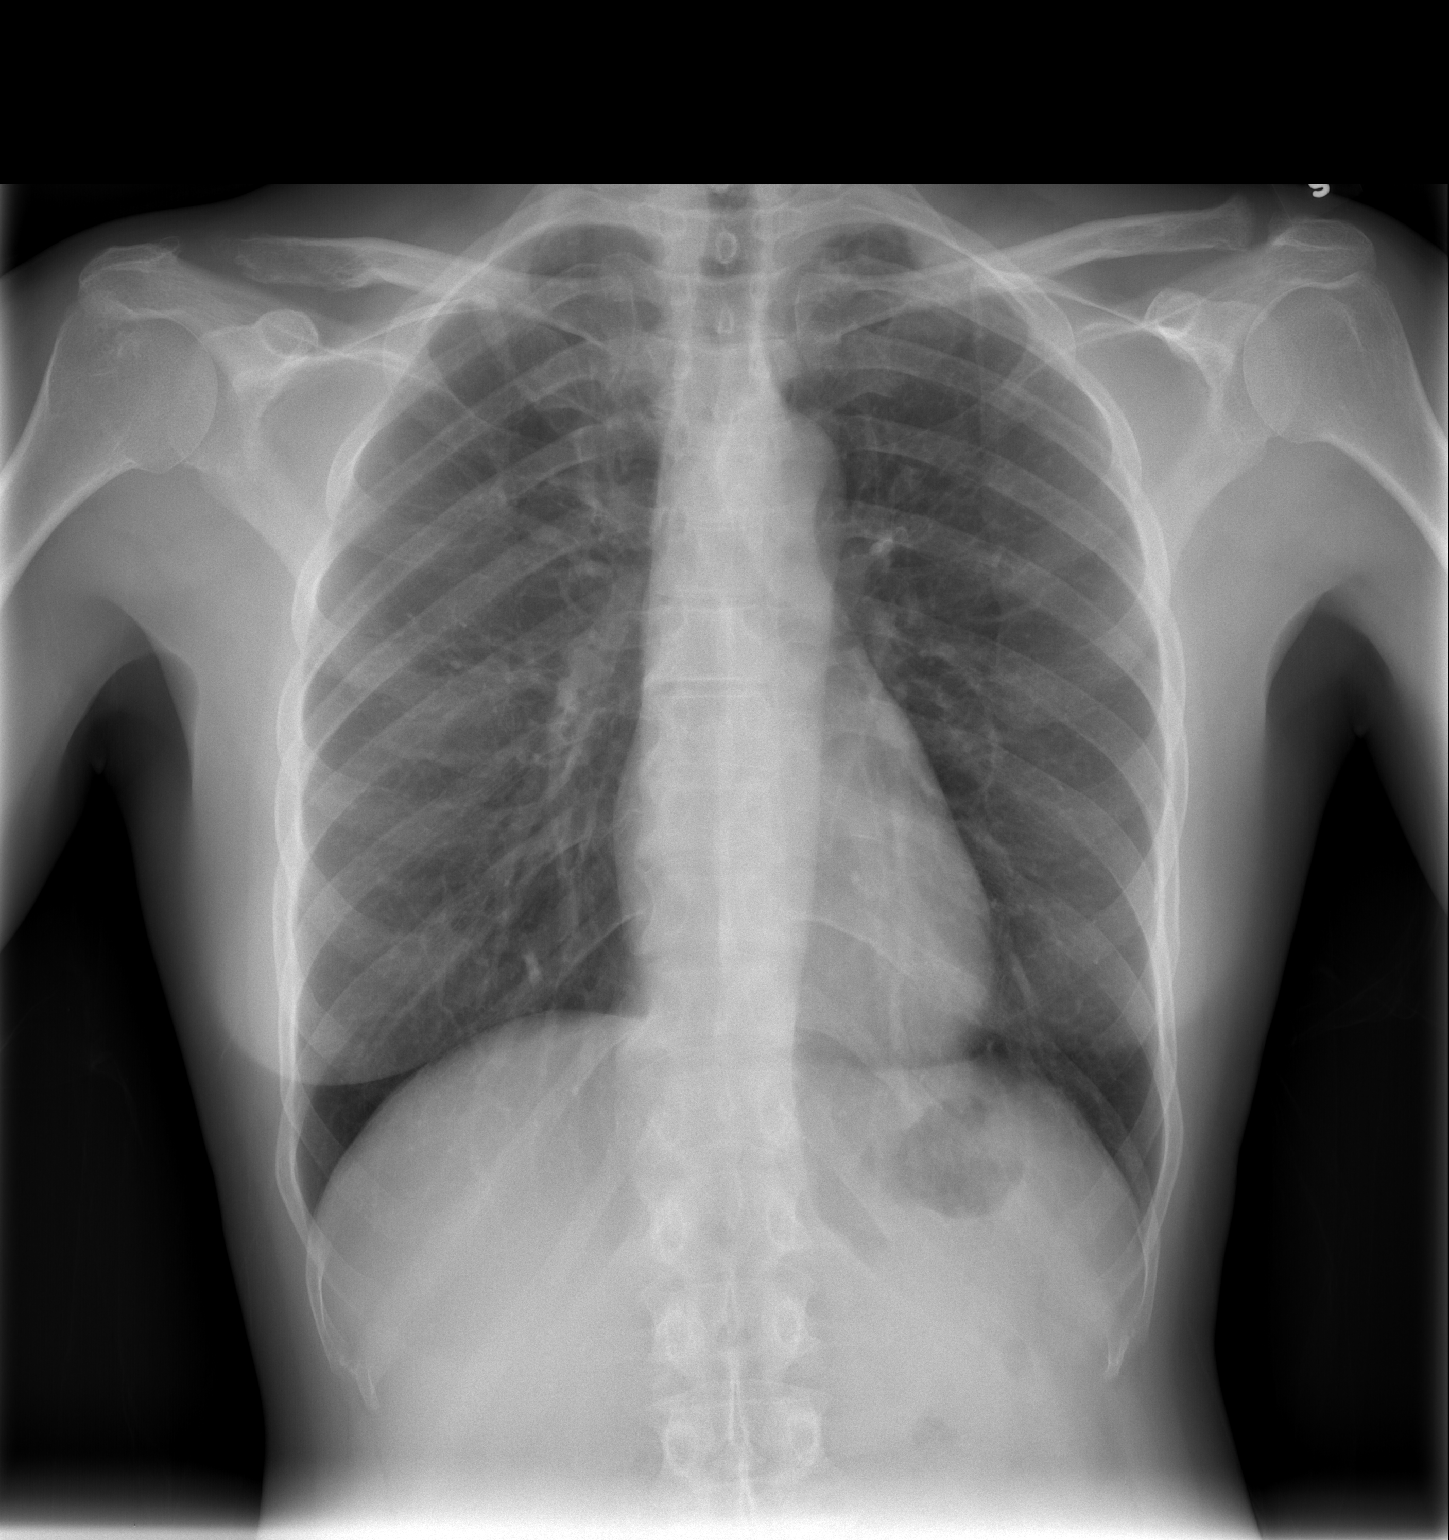

[w chest lat]
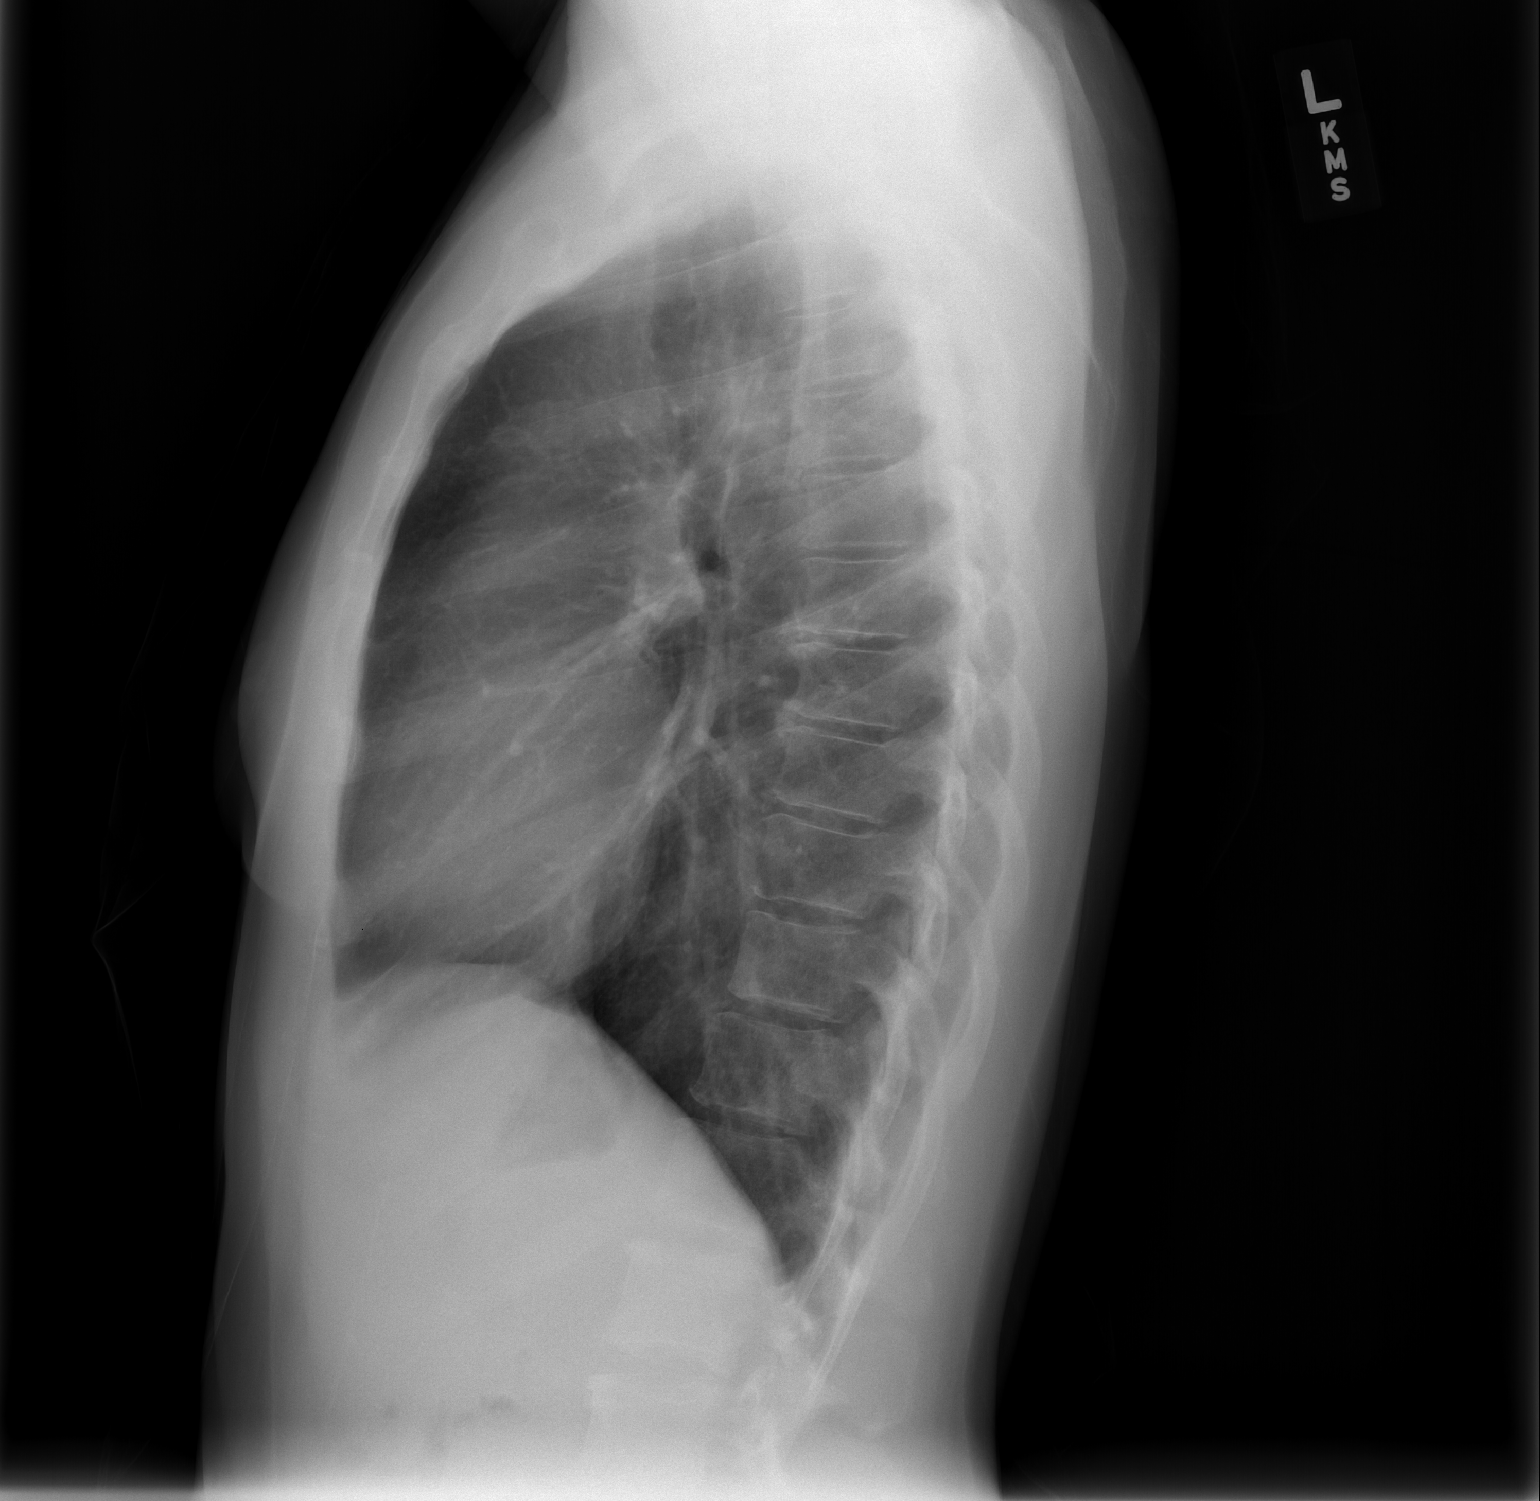

[2 of 2 positions shown; findings below may reference images not displayed]

FINDINGS: The cardiac silhouette is normal size and shape.
Mediastinal and hilar contours appear normal. The lungs are well
aerated and free of infiltrates. No pleural abnormality is evident.
There is minimal degenerative spondylosis compatible with age.
IMPRESSION: No acute abnormal process or cardiopulmonary abnormality is
identified.

## 2012-08-29 ENCOUNTER — Ambulatory Visit: Payer: Commercial Managed Care - PPO | Admitting: Women's Health

## 2012-08-30 ENCOUNTER — Other Ambulatory Visit: Payer: Self-pay | Admitting: Women's Health

## 2012-08-30 DIAGNOSIS — B3731 Acute candidiasis of vulva and vagina: Secondary | ICD-10-CM

## 2012-08-30 DIAGNOSIS — B373 Candidiasis of vulva and vagina: Secondary | ICD-10-CM

## 2012-08-30 MED ORDER — FLUCONAZOLE 150 MG PO TABS: 150.0000 mg | ORAL_TABLET | Freq: Once | ORAL | Status: AC

## 2012-08-31 ENCOUNTER — Encounter: Payer: Self-pay | Admitting: *Deleted

## 2012-09-21 ENCOUNTER — Encounter: Payer: Self-pay | Admitting: Internal Medicine

## 2012-09-21 ENCOUNTER — Ambulatory Visit (INDEPENDENT_AMBULATORY_CARE_PROVIDER_SITE_OTHER): Payer: Self-pay | Admitting: Internal Medicine

## 2012-09-21 VITALS — BP 128/78 | HR 68 | Ht 62.25 in | Wt 161.2 lb

## 2012-09-21 DIAGNOSIS — R933 Abnormal findings on diagnostic imaging of other parts of digestive tract: Secondary | ICD-10-CM

## 2012-09-21 DIAGNOSIS — K224 Dyskinesia of esophagus: Secondary | ICD-10-CM

## 2012-09-21 MED ORDER — OMEPRAZOLE-SODIUM BICARBONATE 40-1100 MG PO CAPS: 1.0000 | ORAL_CAPSULE | Freq: Every day | ORAL | Status: DC

## 2012-09-21 NOTE — Progress Notes (Signed)
Cynthia Berger [birth year] MRN [id number]   History of Present Illness:  This is a 56 year old African American female with dysphagia to pills with gastroesophageal reflux. A recent upper GI with barium esophagram showed three out of four peristaltic waves disrupted at the level of the aortic arch and there was spontaneous gastroesophageal reflux. There was a small hiatal hernia. She was evaluated for gastroesophageal reflux in 2005 by Dr. Loreta Ave and had a normal 24-hour intraesophageal pH probe and esophageal manometry. An upper endoscopy at that time was normal as well. She was positive for H. Pylori which was treated. Her colonoscopy in December 2008 was normal. Her symptoms bother her mostly during the day, not at night.   Past Medical History  Diagnosis Date  . 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 ASCUS (atypical squamous cells of undetermined significance) on Pap smear 01/2011    C&B -INADEQUATE ECC  . Vitamin d deficiency 11/2007    LOW AT 17  . AC (acromioclavicular) joint bone spurs     HEEL BONE SPURS-SURGERY 12/2010 LEFT FOOT  . Antral gastritis   . Positive H. pylori test   . Uterine fibroid   . GERD (gastroesophageal reflux disease)   . Hypertension   . Thyroid nodule RIGHT LOBE-- PER BX NEGATIVE  . H/O hiatal hernia   . History of kidney cancer 2005    S/P RIGHT NEPHRECTOMY   . PMB (postmenopausal bleeding)   . Heart palpitations   . DDD (degenerative disc disease), cervical   . Hiatal hernia   . Esophageal dysmotility    Past Surgical History  Procedure Date  . Cesarean section 1993    W/ TUBAL LIGATION, BILATERAL  . Nephrectomy 2005    RIGHT KIDNEY CANCER  . Rotator cuff repair 2010    RIGHT  . Foot surgery 2012    left heel spur removed  . Transthoracic echocardiogram 04-10-2012  DR BRIEN CRENSHAW    LVSF MILDLY REDUCED/ EF 45-50%  . Hysteroscopy w/d&c 06/08/2012    Procedure:  DILATATION AND CURETTAGE /HYSTEROSCOPY;  Surgeon: Dara Lords, MD;  Location: [facility] [place];  Service: Gynecology;  Laterality: N/A;  WITH RESECTION OF MYOMA    reports that she has never smoked. She has never used smokeless tobacco. She reports that she drinks alcohol. She reports that she does not use illicit drugs. family history includes Hypertension in her mother, sister, and son. Allergies  Allergen Reactions  . Penicillins Rash        Review of Systems: Positive for heartburn and pill dysphagia  The remainder of the 10 point ROS is negative except as outlined in H&P      Assessment and Plan:  Problem #1 We discussed the results of the upper GI and barium esophagram today. It appears that she has esophageal dysmotility as well as gastroesophageal reflux. There is no evidence of esophageal stricture. There is no evidence of compression of her esophagus by the aortic arch. Her most bothersome symptoms are due to reflux. She currently takes Nexium 40 mg at bedtime and we will add Zegerid 20 mg in the morning. She was given samples. We will schedule her for an upper endoscopy. We will also consider using Reglan based on the results of the upper endoscopy. We will plan to repeat her H. pylori test at the time of her procedure.  Problem #2 Colorectal screening. Her last colonoscopy was in December 2008.  A recall colonoscopy will be due in December 2018.  09/21/2012 [name]

## 2012-09-21 NOTE — Patient Instructions (Addendum)
You have been scheduled for an endoscopy with propofol. Please follow written instructions given to you at your visit today. If you use inhalers (even only as needed), please bring them with you on the day of your procedure.  We have given you samples of the following medication to take: Zegerid.  CC: Dr. Artist Pais

## 2012-10-03 ENCOUNTER — Encounter: Payer: Self-pay | Admitting: Women's Health

## 2012-10-03 ENCOUNTER — Ambulatory Visit (INDEPENDENT_AMBULATORY_CARE_PROVIDER_SITE_OTHER): Payer: BC Managed Care – PPO | Admitting: Women's Health

## 2012-10-03 DIAGNOSIS — B977 Papillomavirus as the cause of diseases classified elsewhere: Secondary | ICD-10-CM

## 2012-10-03 NOTE — Progress Notes (Signed)
Patient ID: Cynthia Berger, female   DOB: March 31, 1956, 56 y.o.   MRN: [id number] Presents for a repeat Pap. With review of chart August, 2013 LGSIL. History of persistent  HR HPV, colposcopy and biopsy in 2010/ negative biopsy and ECC. Colposcopy 01/2011 with benign endocervical mucosa, detached fragments of squamous epithelium with HPV effect..  Will schedule to return for a colposcopy and biopsy with Dr. Audie Box. Will reschedule. Significant history, right kidney cancer 2005.

## 2012-10-08 ENCOUNTER — Ambulatory Visit (INDEPENDENT_AMBULATORY_CARE_PROVIDER_SITE_OTHER): Payer: BC Managed Care – PPO | Admitting: Gynecology

## 2012-10-08 ENCOUNTER — Encounter: Payer: Self-pay | Admitting: Gynecology

## 2012-10-08 DIAGNOSIS — IMO0002 Reserved for concepts with insufficient information to code with codable children: Secondary | ICD-10-CM

## 2012-10-08 DIAGNOSIS — R6889 Other general symptoms and signs: Secondary | ICD-10-CM

## 2012-10-08 DIAGNOSIS — R87810 Cervical high risk human papillomavirus (HPV) DNA test positive: Secondary | ICD-10-CM

## 2012-10-08 NOTE — Progress Notes (Signed)
Patient ID: Cynthia Berger, female   DOB: 08/05/1956, 56 y.o.   MRN: [id number] Patient presents with history of persistent low-grade cervical atypia from ascus 2 low-grade SIL over the past several years. Also with positive high-risk HPV. Most recent Pap smear August 2013 with low-grade SIL. Last colposcopy several years ago. Patient presents for colposcopy.  Exam was Sherrilyn Rist Asst. External BUS vagina normal with atrophic changes. Cervix grossly normal colposcopy after acetic acid cleanses and adequate without transformation zone visualized. Small area of faint acetowhite change at 12:00 off the transformation zone. Biopsy at 12 and ECC performed. 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 Colposcopy in adequate. ECC performed biopsy of acetowhite change at 12:00. Patient will follow up for results. If low grade or normal plan expectant management.  Otherwise, triage based on results

## 2012-10-08 NOTE — Patient Instructions (Signed)
Office will call with biopsy results 

## 2012-10-10 ENCOUNTER — Encounter: Payer: Self-pay | Admitting: Gynecology

## 2012-10-11 ENCOUNTER — Telehealth: Payer: Self-pay | Admitting: Gynecology

## 2012-10-11 NOTE — Telephone Encounter (Signed)
Tell patient biopsy shows persistence of atypia and I want to talk to her about the treatment options. Recommend office visit for short discussion of options.

## 2012-10-11 NOTE — Telephone Encounter (Signed)
Patient informed and scheduled.

## 2012-10-12 ENCOUNTER — Ambulatory Visit (INDEPENDENT_AMBULATORY_CARE_PROVIDER_SITE_OTHER): Payer: BC Managed Care – PPO | Admitting: Gynecology

## 2012-10-12 ENCOUNTER — Encounter: Payer: Self-pay | Admitting: Gynecology

## 2012-10-12 DIAGNOSIS — R6889 Other general symptoms and signs: Secondary | ICD-10-CM

## 2012-10-12 DIAGNOSIS — IMO0002 Reserved for concepts with insufficient information to code with codable children: Secondary | ICD-10-CM

## 2012-10-12 NOTE — Progress Notes (Signed)
Patient presents for consultation. 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 Has been followed for several years. Most recent colposcopy inadequate with positive for low-grade SIL. Reviewed situation with the patient to include the persistent low-grade atypia and positive high-risk HPV. Options for management including expectant management, LEEP, cone, hysterectomy reviewed. Given the total picture I think LEEP is the most appropriate choice. I reviewed what is involved with the procedure, the expected intraoperative postoperative courses.  Risks of bleeding, infection, damage to surrounding tissues including vagina bladder rectum requiring future reparative surgeries. Possible pathology results to include no pathology found, cut through lesions and pathology found in clear margins reviewed. She clearly understands that this is viral related and that she still has the virus is at risk for persistence/recurrence his of dysplasia in the future. After a lengthy discussion the patient wants to proceed with the procedure and will schedule a LEEP at her convenience.

## 2012-10-12 NOTE — Patient Instructions (Signed)
Schedule LEEP   Loop Electrosurgical Excision Procedure Loop electrosurgical excision procedure (LEEP) is the removal of a portion of the lower part of the uterus (cervix). The procedure is done when there are significantly abnormal cervical cell changes. Abnormal cell changes of the cervix can lead to cancer if left in place and untreated.  The LEEP procedure itself typically only takes a few minutes. Often, it may be done in your caregiver's office. The procedure is considered safe for those who wish to get pregnant or are trying to get pregnant. Only under rare circumstances should this procedure be done if you are pregnant. LET YOUR CAREGIVER KNOW ABOUT:  Whether you are pregnant or late for your last menstrual period.  Allergies to foods or medicines.  All the medicines you are taking includingherbs, eyedrops, and over-the-counter medicines, and creams.  Use of steroids (by mouth or creams).  Previous problems with anesthetics or numbing medicine.  Previous gynecological surgery.  History of blood clots or bleeding problems.  Any recent or current vaginal infections (herpes, sexually transmitted infections).  Other health problems. RISKS AND COMPLICATIONS  Bleeding.  Infection.  Injury to the vagina, bladder, or rectum.  Very rare obstruction of the cervical opening that causes problems during menstruation (cervical stenosis). BEFORE THE PROCEDURE  Do not take aspirin or blood thinners (anticoagulants) for 1 week before the procedure, or as told by your caregiver.  Eat a light meal before the procedure.  Ask your caregiver about changing or stopping your regular medicines.  You may be given a pain reliever 1 or 2 hours before the procedure. PROCEDURE   A tool (speculum) is placed in the vagina. This allows your caregiver to see the cervix.  An iodine stain is applied to the cervix to find the area of abnormal cells to be removed.  Medicine is injected to numb  the cervix (local anesthetic).   Electricity is passed through a thin wire loop which is then used to remove (cauterize) a small segment of the affected cervix.  Light electrocautery is used to seal any small blood vessels and prevent bleeding.  A paste may be applied to the cauterized area of the cervix to help prevent bleeding.  The tissue sample is sent to the lab. It is examined under the microscope. AFTER THE PROCEDURE  Have someone drive you home.  You may have slight to moderate cramping.  You may notice a black vaginal discharge from the paste used on the cervix to prevent bleeding. This is normal.  Watch for excessive bleeding. This requires immediate medical care.  Ask when your test results will be ready. Make sure you get your test results. Document Released: 02/25/2003 Document Revised: 02/27/2012 Document Reviewed: 05/17/2011 [facility] Patient Information 2013 [place], [place].

## 2012-10-19 HISTORY — PX: CERVICAL BIOPSY  W/ LOOP ELECTRODE EXCISION: SUR135

## 2012-10-22 ENCOUNTER — Telehealth: Payer: Self-pay | Admitting: Internal Medicine

## 2012-10-22 NOTE — Telephone Encounter (Signed)
Patient calling, has had an increase in her b/p this weekend.  Current b/p is 149/80.  On Saturday 11/2 she had slight dizziness and heart palpitations but none since then.   Denies any shortness of breath or chest pain.   Denies any swelling in her feet, hands or face.  Encouraged to drink more water today and tomorrow.    She will continue with her normal dose of medication.

## 2012-10-23 ENCOUNTER — Encounter: Payer: Self-pay | Admitting: Gynecology

## 2012-10-23 ENCOUNTER — Ambulatory Visit (INDEPENDENT_AMBULATORY_CARE_PROVIDER_SITE_OTHER): Payer: BC Managed Care – PPO | Admitting: Gynecology

## 2012-10-23 DIAGNOSIS — IMO0002 Reserved for concepts with insufficient information to code with codable children: Secondary | ICD-10-CM

## 2012-10-23 DIAGNOSIS — R6889 Other general symptoms and signs: Secondary | ICD-10-CM

## 2012-10-23 NOTE — Patient Instructions (Signed)
Loop Electrosurgical Excision Procedure Loop electrosurgical excision procedure (LEEP) is the removal of a portion of the lower part of the uterus (cervix). The procedure is done when there are significantly abnormal cervical cell changes. Abnormal cell changes of the cervix can lead to cancer if left in place and untreated.  The LEEP procedure itself typically only takes a few minutes. Often, it may be done in your caregiver's office. The procedure is considered safe for those who wish to get pregnant or are trying to get pregnant. Only under rare circumstances should this procedure be done if you are pregnant. LET YOUR CAREGIVER KNOW ABOUT:  Whether you are pregnant or late for your last menstrual period.  Allergies to foods or medicines.  All the medicines you are taking includingherbs, eyedrops, and over-the-counter medicines, and creams.  Use of steroids (by mouth or creams).  Previous problems with anesthetics or numbing medicine.  Previous gynecological surgery.  History of blood clots or bleeding problems.  Any recent or current vaginal infections (herpes, sexually transmitted infections).  Other health problems. RISKS AND COMPLICATIONS  Bleeding.  Infection.  Injury to the vagina, bladder, or rectum.  Very rare obstruction of the cervical opening that causes problems during menstruation (cervical stenosis). BEFORE THE PROCEDURE  Do not take aspirin or blood thinners (anticoagulants) for 1 week before the procedure, or as told by your caregiver.  Eat a light meal before the procedure.  Ask your caregiver about changing or stopping your regular medicines.  You may be given a pain reliever 1 or 2 hours before the procedure. PROCEDURE   A tool (speculum) is placed in the vagina. This allows your caregiver to see the cervix.  An iodine stain is applied to the cervix to find the area of abnormal cells to be removed.  Medicine is injected to numb the cervix (local  anesthetic).   Electricity is passed through a thin wire loop which is then used to remove (cauterize) a small segment of the affected cervix.  Light electrocautery is used to seal any small blood vessels and prevent bleeding.  A paste may be applied to the cauterized area of the cervix to help prevent bleeding.  The tissue sample is sent to the lab. It is examined under the microscope. AFTER THE PROCEDURE  Have someone drive you home.  You may have slight to moderate cramping.  You may notice a black vaginal discharge from the paste used on the cervix to prevent bleeding. This is normal.  Watch for excessive bleeding. This requires immediate medical care.  Ask when your test results will be ready. Make sure you get your test results. Document Released: 02/25/2003 Document Revised: 02/27/2012 Document Reviewed: 05/17/2011 [employer] Patient Information 2013 [employer].  

## 2012-10-23 NOTE — Progress Notes (Signed)
The patient and I reviewed her indications for LEEP:  Persistent low-grade atypia oscillating between ASCUS and LGSIL over the past several years.  Most recently with inadequate colposcopy with positive ECC for low-grade SIL.  I again reviewed the procedure with her as outlined in my previous note.  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 Patient understands and accepts the above.   Procedure:   A pelvic exam was performed and was normal.   The cervix was visualized with a LEEP speculum cleansed with acetic acid and re-colposcopy performed. The cervix was circumferentially injected with 2% lidocaine solution with epinephrine 1 / 100,000 dilution, a total of 8 cc's were used. The cervix was then stained with Lugol's solution, which allowed for visualization of the transformation zone grossly. The LEEP generator was set at 60 W cutting 60 W coagulation blend one current. Using the 12 x 15 LEEP wand, the LEEP specimen was excised in a  single pass. An ECC was performed afterwards.  Ball coagulation was delivered to the LEEP base to achieve hemostasis and prophylactic Monsel's solution was applied. The specimen was cut open at 3 o'clock and pinned open on the cork and sent to pathology. The patient tolerated the procedure well and postoperative instructions were discussed with her and a postoperative instruction sheet was given to her. Patient knows to follow up for pathology results in several days and then we will discuss the long-term plan as far as follow up screening.

## 2012-10-26 NOTE — Telephone Encounter (Signed)
Called and left pt a vm to sch ov as noted.

## 2012-10-26 NOTE — Telephone Encounter (Signed)
I suggest OV within 1 week

## 2012-11-01 ENCOUNTER — Encounter: Payer: Self-pay | Admitting: Internal Medicine

## 2012-11-01 ENCOUNTER — Ambulatory Visit (INDEPENDENT_AMBULATORY_CARE_PROVIDER_SITE_OTHER): Payer: BC Managed Care – PPO | Admitting: Internal Medicine

## 2012-11-01 VITALS — BP 170/80 | HR 76 | Temp 98.5°F | Wt 165.0 lb

## 2012-11-01 DIAGNOSIS — Q602 Renal agenesis, unspecified: Secondary | ICD-10-CM

## 2012-11-01 DIAGNOSIS — IMO0002 Reserved for concepts with insufficient information to code with codable children: Secondary | ICD-10-CM

## 2012-11-01 DIAGNOSIS — Q600 Renal agenesis, unilateral: Secondary | ICD-10-CM

## 2012-11-01 DIAGNOSIS — I10 Essential (primary) hypertension: Secondary | ICD-10-CM

## 2012-11-01 DIAGNOSIS — Q605 Renal hypoplasia, unspecified: Secondary | ICD-10-CM

## 2012-11-01 LAB — BASIC METABOLIC PANEL
CO2: 28 mEq/L (ref 19–32)
Chloride: 105 mEq/L (ref 96–112)
Creatinine, Ser: 1.3 mg/dL — ABNORMAL HIGH (ref 0.4–1.2)
Glucose, Bld: 79 mg/dL (ref 70–99)

## 2012-11-01 MED ORDER — AMLODIPINE BESYLATE 2.5 MG PO TABS: 2.5000 mg | ORAL_TABLET | Freq: Every day | ORAL | Status: DC

## 2012-11-01 MED ORDER — NEBIVOLOL HCL 5 MG PO TABS: 5.0000 mg | ORAL_TABLET | Freq: Every morning | ORAL | Status: DC

## 2012-11-01 NOTE — Progress Notes (Signed)
Subjective:    Patient ID: Cynthia Berger, female    DOB: Jun 01, 1956, 56 y.o.   MRN: [id number]  HPI  56 year old African American female with history of kidney cancer/solitary kidney, hypertension, palpitations and cardiomyopathy for followup.  Interval history-she was seen by Dr. Jens Som. Her followup MUGA scan showed normal ejection fraction.  Patient has been monitoring her blood pressure at home. It has been steadily climbing over the last one to 2 weeks. She has noticed several readings of systolic blood pressure in the 170s.  She has associated mild headache. Patient has been taking her blood pressure medication every other day due to cost reasons.  Review of Systems Negative for chest pain    Past Medical History  Diagnosis Date  . LGSIL (low grade squamous intraepithelial dysplasia) 2013     C&B Inadequate ECC--positive LGSIL --  . Vitamin D deficiency 11/2007    LOW AT 17  . AC (acromioclavicular) joint bone spurs     HEEL BONE SPURS-SURGERY 12/2010 LEFT FOOT  . Antral gastritis   . Positive H. pylori test   . Uterine fibroid   . GERD (gastroesophageal reflux disease)   . Hypertension   . Thyroid nodule RIGHT LOBE-- PER BX NEGATIVE  . H/O hiatal hernia   . History of kidney cancer 2005    S/P RIGHT NEPHRECTOMY   . PMB (postmenopausal bleeding)   . Heart palpitations   . DDD (degenerative disc disease), cervical   . Hiatal hernia   . Esophageal dysmotility     History   Social History  . Marital Status: Widowed    Spouse Name: N/A    Number of Children: 4  . Years of Education: N/A   Occupational History  . [employer]    Social History Main Topics  . Smoking status: Never Smoker   . Smokeless tobacco: Never Used  . Alcohol Use: Yes     Comment: occasional  . Drug Use: No  . Sexually Active: Yes -- Female partner(s)    Birth Control/ Protection: Post-menopausal   Other Topics Concern  . Not on file   Social History Narrative  . No narrative on file     Past Surgical History  Procedure Date  . Cesarean section 1993    W/ TUBAL LIGATION, BILATERAL  . Nephrectomy 2005    RIGHT KIDNEY CANCER  . Rotator cuff repair 2010    RIGHT  . Foot surgery 2012    left heel spur removed  . Transthoracic echocardiogram 04-10-2012  DR BRIEN CRENSHAW    LVSF MILDLY REDUCED/ EF 45-50%  . Hysteroscopy w/d&c 06/08/2012    Procedure: DILATATION AND CURETTAGE /HYSTEROSCOPY;  Surgeon: Dara Lords, MD;  Location: [facility] [place];  Service: Gynecology;  Laterality: N/A;  WITH RESECTION OF MYOMA  . Cervical biopsy  w/ loop electrode excision 10/2012    for persistent low-grade dysplasia    Family History  Problem Relation Age of Onset  . Hypertension Mother   . Hypertension Sister   . Hypertension Son     Allergies  Allergen Reactions  . Penicillins Rash    Current Outpatient Prescriptions on File Prior to Visit  Medication Sig Dispense Refill  . esomeprazole (NEXIUM) 40 MG capsule Take 1 capsule (40 mg total) by mouth at bedtime.  30 capsule  10  . Multiple Vitamins-Minerals (MULTIVITAMIN PO) Take by mouth daily.       . [DISCONTINUED] nebivolol (BYSTOLIC) 5 MG tablet Take 2.5 mg by mouth every  morning.      Marland Kitchen amLODipine (NORVASC) 2.5 MG tablet Take 1 tablet (2.5 mg total) by mouth daily.  30 tablet  3  . [DISCONTINUED] dicyclomine (BENTYL) 20 MG tablet Take 1 tablet (20 mg total) by mouth 2 (two) times daily.  40 tablet  0    BP 170/80  Pulse 76  Temp 98.5 F (36.9 C) (Oral)  Wt 165 lb (74.844 kg)  LMP 08/23/2006     Objective:   Physical Exam  Constitutional: She is oriented to person, place, and time. She appears well-developed and well-nourished.  Cardiovascular: Normal rate, regular rhythm and normal heart sounds.   No murmur heard. Pulmonary/Chest: Effort normal and breath sounds normal. She has no wheezes.  Neurological: She is alert and oriented to person, place, and time.  Skin: Skin is warm and dry.   Psychiatric: She has a normal mood and affect. Her behavior is normal.          Assessment & Plan:

## 2012-11-01 NOTE — Assessment & Plan Note (Signed)
Patient's blood pressure has significantly worsened over last 2-4 weeks. Increased Bystolic to 5 mg. Add amlodipine 2.5 mg. Patient advised to monitor blood pressure at home.  She understands to follow low salt diet.  BP: 170/80 mmHg  Monitor electrolytes and kidney function.

## 2012-11-13 ENCOUNTER — Encounter: Payer: Self-pay | Admitting: Internal Medicine

## 2012-11-13 ENCOUNTER — Ambulatory Visit (AMBULATORY_SURGERY_CENTER): Payer: BC Managed Care – PPO | Admitting: Internal Medicine

## 2012-11-13 VITALS — BP 148/76 | HR 52 | Temp 97.1°F | Resp 18 | Ht 62.5 in | Wt 161.0 lb

## 2012-11-13 DIAGNOSIS — R1319 Other dysphagia: Secondary | ICD-10-CM

## 2012-11-13 DIAGNOSIS — R933 Abnormal findings on diagnostic imaging of other parts of digestive tract: Secondary | ICD-10-CM

## 2012-11-13 MED ORDER — SODIUM CHLORIDE 0.9 % IV SOLN: 500.0000 mL | INTRAVENOUS | Status: DC

## 2012-11-13 NOTE — Progress Notes (Signed)
Patient did not experience any of the following events: a burn prior to discharge; a fall within the facility; wrong site/side/patient/procedure/implant event; or a hospital transfer or hospital admission upon discharge from the facility. (G8907) Patient did not have preoperative order for IV antibiotic SSI prophylaxis. (G8918)  

## 2012-11-13 NOTE — Op Note (Signed)
[facility] [address] [place], [address]   ENDOSCOPY PROCEDURE REPORT  PATIENT: Cynthia Berger, Cynthia Berger  MR#: [id number] BIRTHDATE: 03/19/1956 , 56  yrs. old GENDER: Female ENDOSCOPIST: Hart Carwin, MD REFERRED BY:  Thomos Lemons, DO PROCEDURE DATE:  11/13/2012 PROCEDURE:  EGD, diagnostic and Maloney dilation of esophagus ASA CLASS:     Class II INDICATIONS:  Dysphagia. MEDICATIONS: MAC sedation, administered by CRNA and propofol (Diprivan) 300mg  IV TOPICAL ANESTHETIC: none  DESCRIPTION OF PROCEDURE: After the risks benefits and alternatives of the procedure were thoroughly explained, informed consent was obtained.  The LB GIF-H180 [id number] endoscope was introduced through the mouth and advanced to the second portion of the duodenum. Without limitations.  The instrument was slowly withdrawn as the mucosa was fully examined.      The upper, middle and distal third of the esophagus were carefully inspected and no abnormalities were noted.  The z-line was well seen at the GEJ.There was no definite stricture, Maloney dilator 451F passed without difficultu  The endoscope was pushed into the fundus which was normal including a retroflexed view.  The antrum, gastric body, first and second part of the duodenum were unremarkable.  Retroflexed views revealed no abnormalities.     The scope was then withdrawn from the patient and the procedure completed.  COMPLICATIONS: There were no complications. ENDOSCOPIC IMPRESSION: Normal EGD No stricture, passage of 51F  maloney dilator  RECOMMENDATIONS: 1.  Anti-reflux regimen to be follow 2.  Continue PPI  REPEAT EXAM: no recall  eSigned:  Hart Carwin, MD 11/13/2012 4:10 PM   CC:

## 2012-11-13 NOTE — Patient Instructions (Signed)
YOU HAD AN ENDOSCOPIC PROCEDURE TODAY AT THE [facility]: Refer to the procedure report that was given to you for any specific questions about what was found during the examination.  If the procedure report does not answer your questions, please call your gastroenterologist to clarify.  If you requested that your care partner not be given the details of your procedure findings, then the procedure report has been included in a sealed envelope for you to review at your convenience later.  YOU SHOULD EXPECT: Some feelings of bloating in the abdomen. Passage of more gas than usual.  Walking can help get rid of the air that was put into your GI tract during the procedure and reduce the bloating. If you had a lower endoscopy (such as a colonoscopy or flexible sigmoidoscopy) you may notice spotting of blood in your stool or on the toilet paper. If you underwent a bowel prep for your procedure, then you may not have a normal bowel movement for a few days.  DIET: NOTHING TO EAT OR DRINK UNTIL 5 PM. 5-PM UNTIL 6 PM ONLY CLEAR LIQUIDS. AFTER 6 PM SOFT FOODS ONLY. RESUME YOUR DIET IN AM.  ACTIVITY: Your care partner should take you home directly after the procedure.  You should plan to take it easy, moving slowly for the rest of the day.  You can resume normal activity the day after the procedure however you should NOT DRIVE or use heavy machinery for 24 hours (because of the sedation medicines used during the test).    SYMPTOMS TO REPORT IMMEDIATELY: A gastroenterologist can be reached at any hour.  During normal business hours, 8:30 AM to 5:00 PM Monday through Friday, call [phone].  After hours and on weekends, please call the GI answering service at [phone] who will take a message and have the physician on call contact you.  Following upper endoscopy (EGD)  Vomiting of blood or coffee ground material  New chest pain or pain under the shoulder blades  Painful or persistently  difficult swallowing  New shortness of breath  Fever of 100F or higher  Black, tarry-looking stools  FOLLOW UP: If any biopsies were taken you will be contacted by phone or by letter within the next 1-3 weeks.  Call your gastroenterologist if you have not heard about the biopsies in 3 weeks.  Our staff will call the home number listed on your records the next business day following your procedure to check on you and address any questions or concerns that you may have at that time regarding the information given to you following your procedure. This is a courtesy call and so if there is no answer at the home number and we have not heard from you through the emergency physician on call, we will assume that you have returned to your regular daily activities without incident.  SIGNATURES/CONFIDENTIALITY: You and/or your care partner have signed paperwork which will be entered into your electronic medical record.  These signatures attest to the fact that that the information above on your After Visit Summary has been reviewed and is understood.  Full responsibility of the confidentiality of this discharge information lies with you and/or your care-partner.

## 2012-11-14 ENCOUNTER — Telehealth: Payer: Self-pay | Admitting: *Deleted

## 2012-11-14 NOTE — Telephone Encounter (Signed)
  Follow up Call-  Call back number 11/13/2012  Post procedure Call Back phone  # [phone] cell  Permission to leave phone message Yes     Patient questions:  Do you have a fever, pain , or abdominal swelling? no Pain Score  0 *  Have you tolerated food without any problems? yes  Have you been able to return to your normal activities? yes  Do you have any questions about your discharge instructions: Diet   no Medications  no Follow up visit  no  Do you have questions or concerns about your Care? no  Actions: * If pain score is 4 or above: No action needed, pain <4.

## 2012-11-29 ENCOUNTER — Ambulatory Visit (INDEPENDENT_AMBULATORY_CARE_PROVIDER_SITE_OTHER): Payer: BC Managed Care – PPO | Admitting: Women's Health

## 2012-11-29 ENCOUNTER — Telehealth: Payer: Self-pay | Admitting: *Deleted

## 2012-11-29 ENCOUNTER — Encounter: Payer: Self-pay | Admitting: Women's Health

## 2012-11-29 VITALS — BP 142/88 | Ht 64.0 in | Wt 169.0 lb

## 2012-11-29 DIAGNOSIS — Z01419 Encounter for gynecological examination (general) (routine) without abnormal findings: Secondary | ICD-10-CM

## 2012-11-29 DIAGNOSIS — N644 Mastodynia: Secondary | ICD-10-CM

## 2012-11-29 NOTE — Telephone Encounter (Signed)
Message copied by Aura Camps on Thu Nov 29, 2012 11:35 AM ------      Message from: [place], [place] J      Created: Thu Nov 29, 2012 11:21 AM       Right breast pain, shooting type pain, no change in exam. No history of abnormal exams/mammograms.  Personal hx of rt kidney cancer.  [facility]  Please schedule diag mammo [facility]

## 2012-11-29 NOTE — Telephone Encounter (Signed)
Order placed at breast center. 

## 2012-11-29 NOTE — Progress Notes (Signed)
Patient ID: EARLEEN AOUN, female   DOB: 07/22/1956, 56 y.o.   MRN: [id number] Presents with complaint of right breast pain that started last week causing shooting pains across breast, no pain today. Denies change in SBE's or left breast discomfort. Has had  normal mammograms, last  mammogram April 2013. Significant history, left kidney cancer 2005/nephrectomy. Denies SOB or other respiratory symptoms.  Exam: Appears well, breast exam - sitting and lying position without palpable nodules, retractions, nipple discharge, or dimpling.  Mastodynia/right breast  Plan: Diagnostic mammogram. Continue SBE's, report changes. Vitamin D supplement daily encouraged.

## 2012-12-04 NOTE — Telephone Encounter (Signed)
appt 12/07/12 @12 :50 pm

## 2012-12-07 ENCOUNTER — Ambulatory Visit
Admission: RE | Admit: 2012-12-07 | Discharge: 2012-12-07 | Disposition: A | Discharge status: Home / Self Care | Payer: BC Managed Care – PPO | Source: Ambulatory Visit | Attending: Women's Health | Admitting: Women's Health

## 2012-12-07 DIAGNOSIS — N644 Mastodynia: Secondary | ICD-10-CM

## 2012-12-11 ENCOUNTER — Other Ambulatory Visit: Payer: Self-pay

## 2012-12-11 MED ORDER — ESOMEPRAZOLE MAGNESIUM 40 MG PO CPDR: 40.0000 mg | DELAYED_RELEASE_CAPSULE | Freq: Every day | ORAL | Status: DC

## 2012-12-11 NOTE — Telephone Encounter (Signed)
LM for patient that we put Nexium samples up front for pick up along with our shortened hours today.  We received a prior auth. request late yesterday on her Nexium and knew it will take time to work on.  LM for patient that we will work on it asap per Ronny Bacon, CMA (AAMA).

## 2012-12-13 ENCOUNTER — Telehealth: Payer: Self-pay | Admitting: *Deleted

## 2012-12-13 NOTE — Telephone Encounter (Signed)
I have spoken to Tim @ [place] [employer]. Nexium has been approved for 1 year. Approval # is [id number]. Patient has tried Engineer, materials, Prevacid, Protonix and Zegerid in the past.

## 2012-12-19 HISTORY — PX: BIOPSY THYROID: PRO38

## 2013-01-23 ENCOUNTER — Ambulatory Visit (INDEPENDENT_AMBULATORY_CARE_PROVIDER_SITE_OTHER): Payer: PRIVATE HEALTH INSURANCE | Admitting: Internal Medicine

## 2013-01-23 ENCOUNTER — Ambulatory Visit: Payer: BC Managed Care – PPO | Admitting: Internal Medicine

## 2013-01-23 ENCOUNTER — Encounter: Payer: Self-pay | Admitting: Internal Medicine

## 2013-01-23 VITALS — BP 134/72 | HR 68 | Ht 63.0 in | Wt 172.0 lb

## 2013-01-23 DIAGNOSIS — K219 Gastro-esophageal reflux disease without esophagitis: Secondary | ICD-10-CM

## 2013-01-23 DIAGNOSIS — K224 Dyskinesia of esophagus: Secondary | ICD-10-CM

## 2013-01-23 NOTE — Patient Instructions (Addendum)
Cc: Dr Thomos Lemons

## 2013-01-23 NOTE — Progress Notes (Signed)
Cynthia Berger Feb 07, 1956 MRN [id number]   History of Present Illness:  This is a 57 year old African American female with globus sensation in her throat which is worse after eating. She has esophageal dysmotility demonstrated on an upper GI series recently. The x-Binette showed three out of four peristaltic  waves disrupted at the level of the aortic arch. There was spontaneous reflux. A recent upper endoscopy in November 2013 was essentially normal. Maloney dilator 62 [place] passed without resistance. She had a 1-2 cm hiatal hernia. She has been on Nexium 40 mg at bedtime and she is overall improved but still has occasional sensation of globus. She was treated for H. pylori in 2005. She had a normal colonoscopy in December 2008.   Past Medical History  Diagnosis Date  . LGSIL (low grade squamous intraepithelial dysplasia) 2013     C&B Inadequate ECC--positive LGSIL --  . Vitamin D deficiency 11/2007    LOW AT 17  . AC (acromioclavicular) joint bone spurs     HEEL BONE SPURS-SURGERY 12/2010 LEFT FOOT  . Antral gastritis   . Positive H. pylori test   . Uterine fibroid   . GERD (gastroesophageal reflux disease)   . Hypertension   . H/O hiatal hernia   . PMB (postmenopausal bleeding)   . Heart palpitations   . DDD (degenerative disc disease), cervical   . History of kidney cancer 2005    S/P RIGHT NEPHRECTOMY   . Cancer     right kidney ca  . Hiatal hernia   . Esophageal dysmotility   . Thyroid nodule RIGHT LOBE-- PER BX NEGATIVE   Past Surgical History  Procedure Date  . Cesarean section 1993    W/ TUBAL LIGATION, BILATERAL  . Nephrectomy 2005    RIGHT KIDNEY CANCER  . Rotator cuff repair 2010    RIGHT  . Foot surgery 2012    left heel spur removed  . Transthoracic echocardiogram 04-10-2012  DR BRIEN CRENSHAW    LVSF MILDLY REDUCED/ EF 45-50%  . Hysteroscopy w/d&c 06/08/2012    Procedure: DILATATION AND CURETTAGE /HYSTEROSCOPY;  Surgeon: Dara Lords, MD;  Location:  [facility];  Service: Gynecology;  Laterality: N/A;  WITH RESECTION OF MYOMA  . Cervical biopsy  w/ loop electrode excision 10/2012    for persistent low-grade dysplasia  . Tubal ligation     reports that she has never smoked. She has never used smokeless tobacco. She reports that she drinks alcohol. She reports that she does not use illicit drugs. family history includes Hypertension in her mother, sister, and son.  There is no history of Colon cancer, and Esophageal cancer, and Rectal cancer, and Stomach cancer, .      Review of Systems: Denies dysphagia odynophagia  The remainder of the 10 point ROS is negative except as outlined in H&P   Physical Exam: General appearance  Well developed, in no distress. Psychological normal mood and affect.  Assessment and Plan:  Problem #1 Persistent globus sensation, improved on the proton pump inhibitor. She has known esophageal dysmotility and gastroesophageal reflux. Patient will add Gaviscon 1-2 tablets postprandially and I have given her samples of  probiotics to take daily to improve her bacterial flora. No further GI evaluation is planned at this time.    01/23/2013 Cynthia Berger

## 2013-02-19 ENCOUNTER — Other Ambulatory Visit: Payer: Self-pay | Admitting: Internal Medicine

## 2013-03-12 ENCOUNTER — Other Ambulatory Visit: Payer: Self-pay | Admitting: *Deleted

## 2013-03-12 MED ORDER — NEBIVOLOL HCL 5 MG PO TABS: 5.0000 mg | ORAL_TABLET | Freq: Every morning | ORAL | Status: DC

## 2013-03-12 MED ORDER — AMLODIPINE BESYLATE 2.5 MG PO TABS: ORAL_TABLET | ORAL | Status: DC

## 2013-04-03 ENCOUNTER — Other Ambulatory Visit (HOSPITAL_COMMUNITY)
Admission: RE | Admit: 2013-04-03 | Discharge: 2013-04-03 | Disposition: A | Discharge status: Home / Self Care | Payer: PRIVATE HEALTH INSURANCE | Source: Ambulatory Visit | Attending: Women's Health | Admitting: Women's Health

## 2013-04-03 ENCOUNTER — Encounter: Payer: Self-pay | Admitting: Women's Health

## 2013-04-03 ENCOUNTER — Other Ambulatory Visit (HOSPITAL_COMMUNITY)
Admission: RE | Admit: 2013-04-03 | Discharge: 2013-04-03 | Disposition: A | Discharge status: Home / Self Care | Payer: PRIVATE HEALTH INSURANCE | Source: Ambulatory Visit | Attending: Obstetrics and Gynecology | Admitting: Obstetrics and Gynecology

## 2013-04-03 ENCOUNTER — Ambulatory Visit (INDEPENDENT_AMBULATORY_CARE_PROVIDER_SITE_OTHER): Payer: PRIVATE HEALTH INSURANCE | Admitting: Women's Health

## 2013-04-03 DIAGNOSIS — Z01419 Encounter for gynecological examination (general) (routine) without abnormal findings: Secondary | ICD-10-CM | POA: Insufficient documentation

## 2013-04-03 DIAGNOSIS — Z0142 Encounter for cervical smear to confirm findings of recent normal smear following initial abnormal smear: Secondary | ICD-10-CM

## 2013-04-03 DIAGNOSIS — Z0189 Encounter for other specified special examinations: Secondary | ICD-10-CM

## 2013-04-03 DIAGNOSIS — R87810 Cervical high risk human papillomavirus (HPV) DNA test positive: Secondary | ICD-10-CM | POA: Insufficient documentation

## 2013-04-03 DIAGNOSIS — Z1151 Encounter for screening for human papillomavirus (HPV): Secondary | ICD-10-CM | POA: Insufficient documentation

## 2013-04-03 DIAGNOSIS — R6889 Other general symptoms and signs: Secondary | ICD-10-CM

## 2013-04-03 DIAGNOSIS — IMO0001 Reserved for inherently not codable concepts without codable children: Secondary | ICD-10-CM

## 2013-04-03 NOTE — Patient Instructions (Addendum)

## 2013-04-03 NOTE — Progress Notes (Signed)
Patient ID: Cynthia Berger, female   DOB: [birth year], 57 y.o.   MRN: [id number] Presents for Pap. 08/2011 Pap ascus with positive HR HPV, 02/2012 benign changes,10/2012 Pap  LGSIL CIN 1. Colposcopy and biopsy LGSIL/CIN-1,  LEEP 10/2012 for persistent CIN-1 was benign/ Dr. Audie Box.  Without complaint today, denies vaginal, urinary symptoms. Postmenopausal on no HRT with no bleeding.  Exam: External genitalia within normal limits, speculum exam cervix pink without visible lesion, cervical os stenotic Pap taken. Bimanual uterus fibroid, nontender and mobile. No adnexal fullness or tenderness.  LEEP 10/2012 first Pap following  Plan: Pap pending, anticipate annual screen until 4 normal.

## 2013-04-11 ENCOUNTER — Other Ambulatory Visit: Payer: Self-pay

## 2013-04-11 ENCOUNTER — Other Ambulatory Visit: Payer: Self-pay | Admitting: Gynecology

## 2013-04-11 DIAGNOSIS — Z1231 Encounter for screening mammogram for malignant neoplasm of breast: Secondary | ICD-10-CM

## 2013-04-12 ENCOUNTER — Telehealth: Payer: Self-pay

## 2013-04-12 NOTE — Telephone Encounter (Signed)
I now see Cynthia Berger tried to call patient regarding result.  I will contact patient regarding this.

## 2013-04-12 NOTE — Telephone Encounter (Signed)
Patient informed regarding pap result and reassured.  She will follow-up at her [facility] in Dec and recall was put in system.

## 2013-04-12 NOTE — Telephone Encounter (Signed)
Patient said she is returning your call from yesterday.

## 2013-04-18 ENCOUNTER — Ambulatory Visit (HOSPITAL_COMMUNITY)
Admission: RE | Admit: 2013-04-18 | Discharge: 2013-04-18 | Disposition: A | Discharge status: Home / Self Care | Payer: PRIVATE HEALTH INSURANCE | Source: Ambulatory Visit | Attending: Gynecology | Admitting: Gynecology

## 2013-04-18 DIAGNOSIS — Z1231 Encounter for screening mammogram for malignant neoplasm of breast: Secondary | ICD-10-CM | POA: Insufficient documentation

## 2013-04-24 ENCOUNTER — Telehealth: Payer: Self-pay | Admitting: Women's Health

## 2013-04-24 NOTE — Telephone Encounter (Signed)
Telephone call to discuss Pap smear HPV typing. Has had problems with  recurrent LGSIL, LEEP with a benign biopsy 10/2012. History of kidney cancer/nephrectomy. Reviewed HPV typing positive for 16 which may lead to problems of cervical cancer. Reviewed may need to consider hysterectomy due to persistence of LGSIL and +16 HR HPV. Verbalized understanding, will schedule appointment with Dr. Audie Box to discuss, switched to appointments.

## 2013-04-30 ENCOUNTER — Ambulatory Visit (INDEPENDENT_AMBULATORY_CARE_PROVIDER_SITE_OTHER): Payer: PRIVATE HEALTH INSURANCE | Admitting: Gynecology

## 2013-04-30 ENCOUNTER — Encounter: Payer: Self-pay | Admitting: Gynecology

## 2013-04-30 DIAGNOSIS — R87810 Cervical high risk human papillomavirus (HPV) DNA test positive: Secondary | ICD-10-CM

## 2013-04-30 DIAGNOSIS — R6889 Other general symptoms and signs: Secondary | ICD-10-CM

## 2013-04-30 DIAGNOSIS — IMO0002 Reserved for concepts with insufficient information to code with codable children: Secondary | ICD-10-CM

## 2013-04-30 NOTE — Patient Instructions (Signed)
Office will call you to arrange surgery. 

## 2013-04-30 NOTE — Progress Notes (Signed)
Patient presents to discuss her most recent Pap smear showing LGSIL with positive HPV subtypes 16. Has history of long term ASCUS/LGSIL Pap smears. Had a positive ECC ultimately underwent LEEP 10/2012 with no dysplasia found. At her first Pap smear afterwards showed LGSIL with positive high-risk HPV subtype 16.  Exam with Tim Asst. Abdomen soft nontender without masses guarding rebound organomegaly. Pelvic external BUS vagina normal. Cervix scarred. Tenaculum shows fair descent. Uterus normal size midline mobile nontender. Adnexa without masses or tenderness.  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 Negative bleed. Options for management including continued expectant management with screening and if HGSIL more aggressive treatment. Alternatives to include cone biopsy noting her cervix is somewhat scarred now. Ultimately option for hysterectomy with or without BSO discussed. Patient understands that this does not eradicate the virus and that she is at risk for vaginal or vulvar intraepithelial neoplasia postoperative which would need to be followed and possibly treated. Is not a guarantee that she will never have to deal with this again. It does prevent the risk of cervical cancer in the future. Issues of ovarian conservation to include hormone production versus issues such as pain, tumors up to including malignancy in the future discussed. She is 5 years amenorrheic without significant symptoms. At this point the patient wants to proceed with hysterectomy with BSO. We'll schedule as LAVH BSO. Patient will return for a full preop consult before hand.

## 2013-05-10 ENCOUNTER — Encounter: Payer: Self-pay | Admitting: Internal Medicine

## 2013-05-10 ENCOUNTER — Ambulatory Visit (INDEPENDENT_AMBULATORY_CARE_PROVIDER_SITE_OTHER)
Admission: RE | Admit: 2013-05-10 | Discharge: 2013-05-10 | Disposition: A | Discharge status: Home / Self Care | Payer: PRIVATE HEALTH INSURANCE | Source: Ambulatory Visit | Attending: Internal Medicine | Admitting: Internal Medicine

## 2013-05-10 ENCOUNTER — Ambulatory Visit: Payer: PRIVATE HEALTH INSURANCE

## 2013-05-10 ENCOUNTER — Ambulatory Visit (INDEPENDENT_AMBULATORY_CARE_PROVIDER_SITE_OTHER): Payer: PRIVATE HEALTH INSURANCE | Admitting: Internal Medicine

## 2013-05-10 VITALS — BP 136/94 | HR 60 | Temp 98.0°F | Wt 181.0 lb

## 2013-05-10 DIAGNOSIS — I10 Essential (primary) hypertension: Secondary | ICD-10-CM

## 2013-05-10 DIAGNOSIS — R071 Chest pain on breathing: Secondary | ICD-10-CM

## 2013-05-10 DIAGNOSIS — Q602 Renal agenesis, unspecified: Secondary | ICD-10-CM

## 2013-05-10 DIAGNOSIS — R0789 Other chest pain: Secondary | ICD-10-CM

## 2013-05-10 DIAGNOSIS — Q600 Renal agenesis, unilateral: Secondary | ICD-10-CM

## 2013-05-10 DIAGNOSIS — Z01810 Encounter for preprocedural cardiovascular examination: Secondary | ICD-10-CM

## 2013-05-10 DIAGNOSIS — Z85528 Personal history of other malignant neoplasm of kidney: Secondary | ICD-10-CM

## 2013-05-10 DIAGNOSIS — Z01818 Encounter for other preprocedural examination: Secondary | ICD-10-CM

## 2013-05-10 DIAGNOSIS — IMO0002 Reserved for concepts with insufficient information to code with codable children: Secondary | ICD-10-CM

## 2013-05-10 DIAGNOSIS — E042 Nontoxic multinodular goiter: Secondary | ICD-10-CM

## 2013-05-10 LAB — CBC WITH DIFFERENTIAL/PLATELET
Basophils Absolute: 0 10*3/uL (ref 0.0–0.1)
HCT: 38.5 % (ref 36.0–46.0)
Hemoglobin: 12.9 g/dL (ref 12.0–15.0)
MCH: 28.7 pg (ref 26.0–34.0)
MCHC: 33.5 g/dL (ref 30.0–36.0)
MCV: 85.7 fL (ref 78.0–100.0)
Monocytes Absolute: 0.7 10*3/uL (ref 0.1–1.0)
Monocytes Relative: 10 % (ref 3–12)

## 2013-05-10 LAB — TSH: TSH: 0.45 u[IU]/mL (ref 0.35–5.50)

## 2013-05-10 LAB — LIPID PANEL
HDL: 64.3 mg/dL (ref >39.00)
Total CHOL/HDL Ratio: 3
Triglycerides: 82 mg/dL (ref 0.0–149.0)

## 2013-05-10 LAB — BASIC METABOLIC PANEL
CO2: 27 mEq/L (ref 19–32)
Calcium: 9.5 mg/dL (ref 8.4–10.5)
Creatinine, Ser: 1.3 mg/dL — ABNORMAL HIGH (ref 0.4–1.2)
GFR: 54.49 mL/min — ABNORMAL LOW (ref >60.00)
Sodium: 138 mEq/L (ref 135–145)

## 2013-05-10 MED ORDER — AMLODIPINE BESYLATE 2.5 MG PO TABS: ORAL_TABLET | ORAL | Status: DC

## 2013-05-10 MED ORDER — NEBIVOLOL HCL 5 MG PO TABS: 5.0000 mg | ORAL_TABLET | Freq: Every morning | ORAL | Status: DC

## 2013-05-10 MED ORDER — ESOMEPRAZOLE MAGNESIUM 40 MG PO CPDR: 40.0000 mg | DELAYED_RELEASE_CAPSULE | Freq: Every day | ORAL | Status: DC

## 2013-05-10 NOTE — Progress Notes (Signed)
Subjective:    Patient ID: Cynthia Berger, female    DOB: 27-May-1956, 57 y.o.   MRN: [id number]  HPI  57 year old African American female with history of palpitations, right kidney cancer (status post right nephrectomy 2005) and chronic GERD presents for preoperative evaluation. Patient scheduled to have hysterectomy with Dr. Audie Box.  Patient's blood pressures been well-controlled. She monitors her blood pressure at home. Usually her systolic blood pressure readings are between 120 and 130. Diastolic readings in the 80s.  In 2013 patient underwent 2-D echo for palpitations. There was question of cardiomyopathy with reduced ejection fraction. She later underwent MUGA scan which was normal. Patient denies any further issues with shortness of breath or dyspnea with exertion. She reports occasional left sternal sharp pains and burning sensation. Her symptoms sometimes improve with Nexium.  Her symptoms are non exertional.    Review of Systems Negative for chest pain or shortness of breath She does not smoke    Past Medical History  Diagnosis Date  . LGSIL (low grade squamous intraepithelial dysplasia) 2013     C&B Inadequate ECC--positive LGSIL --  . Vitamin D deficiency 11/2007    LOW AT 18  . AC (acromioclavicular) joint bone spurs     HEEL BONE SPURS-SURGERY 12/2010 LEFT FOOT  . Antral gastritis   . Positive H. pylori test   . Uterine fibroid   . GERD (gastroesophageal reflux disease)   . Hypertension   . H/O hiatal hernia   . PMB (postmenopausal bleeding)   . Heart palpitations   . DDD (degenerative disc disease), cervical   . History of kidney cancer 2005    S/P RIGHT NEPHRECTOMY   . Cancer     right kidney ca  . Hiatal hernia   . Esophageal dysmotility   . Thyroid nodule RIGHT LOBE-- PER BX NEGATIVE    History   Social History  . Marital Status: Widowed    Spouse Name: N/A    Number of Children: 4  . Years of Education: N/A   Occupational History  . [employer]     Social History Main Topics  . Smoking status: Never Smoker   . Smokeless tobacco: Never Used  . Alcohol Use: Yes     Comment: occasional  . Drug Use: No  . Sexually Active: Yes -- Female partner(s)    Birth Control/ Protection: Post-menopausal   Other Topics Concern  . Not on file   Social History Narrative  . No narrative on file    Past Surgical History  Procedure Laterality Date  . Cesarean section  1993    W/ TUBAL LIGATION, BILATERAL  . Nephrectomy  2005    RIGHT KIDNEY CANCER  . Rotator cuff repair  2010    RIGHT  . Foot surgery  2012    left heel spur removed  . Transthoracic echocardiogram  04-10-2012  DR BRIEN CRENSHAW    LVSF MILDLY REDUCED/ EF 45-50%  . Hysteroscopy w/d&c  06/08/2012    Procedure: DILATATION AND CURETTAGE /HYSTEROSCOPY;  Surgeon: Dara Lords, MD;  Location: [facility];  Service: Gynecology;  Laterality: N/A;  WITH RESECTION OF MYOMA  . Cervical biopsy  w/ loop electrode excision  10/2012    for persistent low-grade dysplasia  . Tubal ligation      Family History  Problem Relation Age of Onset  . Hypertension Mother   . Hypertension Sister   . Hypertension Son   . Colon cancer Neg Hx   . Esophageal  cancer Neg Hx   . Rectal cancer Neg Hx   . Stomach cancer Neg Hx     Allergies  Allergen Reactions  . Penicillins Rash    Current Outpatient Prescriptions on File Prior to Visit  Medication Sig Dispense Refill  . Multiple Vitamins-Minerals (MULTIVITAMIN PO) Take by mouth daily.       . [DISCONTINUED] dicyclomine (BENTYL) 20 MG tablet Take 1 tablet (20 mg total) by mouth 2 (two) times daily.  40 tablet  0   No current facility-administered medications on file prior to visit.    BP 136/94  Pulse 60  Temp(Src) 98 F (36.7 C) (Oral)  Wt 181 lb (82.101 kg)  BMI 32.07 kg/m2  LMP 08/23/2006  EKG shows normal sinus rhythm at 60 beats per minute. Poor R wave progression.  Reviewed MUGA scan 05/23/2012 LV  ejection fraction 68%  2-D echo-04/10/2012 Left ventricle: Cavity size is normal. Wall thickness was normal. Systolic function was mildly reduced. Estimated EF was in the range of 45-50%. Diffuse hypokinesis. Left ventricular diastolic function parameters were normal.  Objective:   Physical Exam  Constitutional: She is oriented to person, place, and time. She appears well-developed and well-nourished.  Neck: Neck supple.  Cardiovascular: Normal rate, regular rhythm and normal heart sounds.   No murmur heard. Pulmonary/Chest: Effort normal and breath sounds normal. She has no wheezes.  Abdominal: Bowel sounds are normal. She exhibits no mass. There is no tenderness.  Musculoskeletal: She exhibits no edema.  Neurological: She is alert and oriented to person, place, and time. No cranial nerve deficit.  Skin: Skin is warm and dry.  Psychiatric: She has a normal mood and affect. Her behavior is normal.          Assessment & Plan:

## 2013-05-10 NOTE — Assessment & Plan Note (Signed)
57 year old [place] American female for preoperative evaluation. Patient scheduled for elective hysterectomy. She has rare intermittent left-sided sharp/burning sensation. Her symptoms are very atypical. Her EKG is normal. I do not think she needs any further cardiac testing. Patient discussed with her cardiologist - Dr. Jens Som. Obtain chest x-[facility]. Check her electrolytes and kidney function considering history of solitary kidney. Proceed with elective hysterectomy.

## 2013-05-10 NOTE — Assessment & Plan Note (Signed)
Stable and controlled

## 2013-05-10 NOTE — Patient Instructions (Signed)
Our office will contact you if we need to schedule any additional preoperative testing

## 2013-05-14 ENCOUNTER — Encounter (HOSPITAL_COMMUNITY): Payer: Self-pay | Admitting: Pharmacy Technician

## 2013-05-16 ENCOUNTER — Encounter (HOSPITAL_COMMUNITY)
Admission: RE | Admit: 2013-05-16 | Discharge: 2013-05-16 | Disposition: A | Discharge status: Home / Self Care | Payer: PRIVATE HEALTH INSURANCE | Source: Ambulatory Visit | Attending: Gynecology | Admitting: Gynecology

## 2013-05-16 ENCOUNTER — Encounter (HOSPITAL_COMMUNITY): Payer: Self-pay

## 2013-05-16 DIAGNOSIS — Z01812 Encounter for preprocedural laboratory examination: Secondary | ICD-10-CM | POA: Insufficient documentation

## 2013-05-16 DIAGNOSIS — Z01818 Encounter for other preprocedural examination: Secondary | ICD-10-CM | POA: Insufficient documentation

## 2013-05-16 LAB — COMPREHENSIVE METABOLIC PANEL
ALT: 13 U/L (ref 0–35)
Alkaline Phosphatase: 94 U/L (ref 39–117)
Chloride: 102 mEq/L (ref 96–112)
GFR calc Af Amer: 52 mL/min — ABNORMAL LOW (ref >90)
Glucose, Bld: 86 mg/dL (ref 70–99)
Potassium: 4.9 mEq/L (ref 3.5–5.1)
Sodium: 137 mEq/L (ref 135–145)
Total Bilirubin: 0.3 mg/dL (ref 0.3–1.2)
Total Protein: 7.9 g/dL (ref 6.0–8.3)

## 2013-05-16 LAB — CBC
HCT: 39.9 % (ref 36.0–46.0)
Hemoglobin: 13.2 g/dL (ref 12.0–15.0)
MCHC: 33.1 g/dL (ref 30.0–36.0)
RBC: 4.51 MIL/uL (ref 3.87–5.11)
WBC: 6.2 10*3/uL (ref 4.0–10.5)

## 2013-05-16 NOTE — Pre-Procedure Instructions (Signed)
Patient history and past cardiac testing results reviewed and accepted by Dr Rodman Pickle. No orders given.

## 2013-05-16 NOTE — Patient Instructions (Addendum)
20 Cynthia Berger  05/16/2013   Your procedure is scheduled on:  05/28/13  Enter through the Main Entrance of [facility] at 6 AM.  Pick up the phone at the desk and dial [phone].   Call this number if you have problems the morning of surgery: [phone]   Remember:   Do not eat food:After Midnight.  Do not drink clear liquids: After Midnight.  Take these medicines the morning of surgery with A SIP OF WATER: Both blood pressure medications and Nexium   Do not wear jewelry, make-up or nail polish.  Do not wear lotions, powders, or perfumes. You may wear deodorant.  Do not shave 48 hours prior to surgery.  Do not bring valuables to the hospital.  [facility] is not responsible                  for any belongings or valuables brought to the hospital.  Contacts, dentures or bridgework may not be worn into surgery.  Leave suitcase in the car. After surgery it may be brought to your room.  For patients admitted to the hospital, checkout time is 11:00 AM the day of                discharge.   Patients discharged the day of surgery will not be allowed to drive                   home.  Name and phone number of your driver: NA  Special Instructions: Shower using CHG 2 nights before surgery and the night before surgery.  If you shower the day of surgery use CHG.  Use special wash - you have one bottle of CHG for all showers.  You should use approximately 1/3 of the bottle for each shower.   Please read over the following fact sheets that you were given: MRSA Information

## 2013-05-23 ENCOUNTER — Encounter: Payer: Self-pay | Admitting: Gynecology

## 2013-05-23 ENCOUNTER — Ambulatory Visit (INDEPENDENT_AMBULATORY_CARE_PROVIDER_SITE_OTHER): Payer: PRIVATE HEALTH INSURANCE | Admitting: Gynecology

## 2013-05-23 DIAGNOSIS — R6889 Other general symptoms and signs: Secondary | ICD-10-CM

## 2013-05-23 DIAGNOSIS — IMO0002 Reserved for concepts with insufficient information to code with codable children: Secondary | ICD-10-CM

## 2013-05-23 NOTE — Progress Notes (Signed)
Cynthia Berger 23-Mar-1956 [id number]   Preoperative consult  Chief complaint: Persistent cervical dysplasia with positive high-risk HPV/subtyped 16  History of present illness: 57 y.o. [id number] postmenopausal patient with persistent low-grade dysplasia starting in 2010 with a positive high-risk HPV screen. Her Pap smears have persistented abnormal with colposcopy inadequate and a positive ECC 2013 for low-grade dysplasia. 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 Her cervical exam is compromised by her postmenopausal state and scarring limiting colposcopic evaluation or repeat LEEP/cone procedures and the options reviewed to include continued expectant management as long as remains low-grade to follow versus proceeding with hysterectomy rule out higher endocervical canal disease and the patient elects for hysterectomy. Patient's admitted for LAVH BSO.  Past medical history,surgical history, medications, allergies, family history and social history were all reviewed and documented in the EPIC chart. ROS:  Was performed and pertinent positives and negatives are included in the history of present illness.  Exam:  Chief Operating Officer: well developed, well nourished female, no acute distress HEENT: normal  Lungs: clear to auscultation without wheezing, rales or rhonchi  Cardiac: regular rate without rubs, murmurs or gallops  Abdomen: soft, nontender without masses, guarding, rebound, organomegaly  Pelvic: external bus vagina: normal   Cervix: Scarred Uterus: normal size, midline and mobile, nontender  Adnexa: without masses or tenderness  Rectovaginal exam within normal limits     Assessment/Plan:  57 y.o. [id number] with history per above for LAVH BSO. The patient clearly understands that we are not eradicating the virus and that she is at risk for vaginal recurrence of dysplasia which may  need to be treated in the future and that we are not guaranteeing that she will not have to problems in the future. She also understands that we may not find dysplasia in the pathologic specimen due to a microscopic focus and that the final pathology may be negative. We discussed ovarian conservation issue. She is postmenopausal and the options to keep both ovaries versus removing them discussed. At age 57 recent data may imply some benefit from a cardiovascular standpoint but unlikely she is receiving much hormonal support and the risk of keeping both ovaries to include benign disease and malignant ovarian cancer in the future was reviewed. The patient desires both ovaries removed and would consider ERT afterwards if needed. Sexuality following hysterectomy was discussed and the potential for persistent dyspareunia and orgasmic dysfunction reviewed. Childbearing is obviously is not an issue. The expected intraoperative and postoperative courses as well as the postoperative recovery period were reviewed. The laparoscopic approach with multiple port sites was reviewed. But she understands at any time during the surgery I may convert to a TAH which would mean a larger incision and a longer recovery period all of which she understands and accepts.The risks of infection, prolonged antibiotics, reoperation for abscess or hematoma drainage, incisional complications requiring opening and draining of incisions, closure by secondary intention and long-term issues of hernia and cosmetics/keloid formation discussed.  The risks of hemorrhage necessitating transfusion and the risks of transfusion discussed to include transfusion reaction, hepatitis, HIV, mad cow disease and other unknown entities was reviewed. The risk of inadvertent injury to internal organs recognizing her history of prior surgery including bowel, bladder, ureters, vessels and nerves either immediately recognized or delay recognized requiring major exploratory  reparative surgeries and future reparative surgeries including ostomy formation, bowel resection, bladder repair, ureteral damage repair was all  discussed understood and accepted. The patient's questions were answered to her satisfaction and she is ready to proceed with surgery.  Dara Lords MD, 3:04 PM 05/23/2013

## 2013-05-23 NOTE — H&P (Signed)
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 [id number] postmenopausal patient with persistent low-grade dysplasia starting in 2010 with a positive high-risk HPV screen. Her Pap smears have persistented abnormal with colposcopy inadequate and a positive ECC 2013 for low-grade dysplasia. 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 Her cervical exam is compromised by her postmenopausal state and scarring limiting colposcopic evaluation or repeat LEEP/cone procedures and the options reviewed to include continued expectant management as long as remains low-grade to follow versus proceeding with hysterectomy rule out higher endocervical canal disease and the patient elects for hysterectomy. Patient's admitted for LAVH BSO.  Past medical history,surgical history, medications, allergies, family history and social history were all reviewed and documented in the EPIC chart. ROS:  Was performed and pertinent positives and negatives are included in the history of present illness.  Exam:  Chief Operating Officer: well developed, well nourished female, no acute distress HEENT: normal  Lungs: clear to auscultation without wheezing, rales or rhonchi  Cardiac: regular rate without rubs, murmurs or gallops  Abdomen: soft, nontender without masses, guarding, rebound, organomegaly  Pelvic: external bus vagina: normal   Cervix: Scarred Uterus: normal size, midline and mobile, nontender  Adnexa: without masses or tenderness  Rectovaginal exam within normal limits     Assessment/Plan:  57 y.o. [id number] with history per above for LAVH BSO. The patient clearly understands that we are not eradicating the virus and that she is at risk for vaginal recurrence of dysplasia which may  need to be treated in the future and that we are not guaranteeing that she will not have to problems in the future. She also understands that we may not find dysplasia in the pathologic specimen due to a microscopic focus and that the final pathology may be negative. We discussed ovarian conservation issue. She is postmenopausal and the options to keep both ovaries versus removing them discussed. At age 68 recent data may imply some benefit from a cardiovascular standpoint but unlikely she is receiving much hormonal support and the risk of keeping both ovaries to include benign disease and malignant ovarian cancer in the future was reviewed. The patient desires both ovaries removed and would consider ERT afterwards if needed. Sexuality following hysterectomy was discussed and the potential for persistent dyspareunia and orgasmic dysfunction reviewed. Childbearing is obviously is not an issue. The expected intraoperative and postoperative courses as well as the postoperative recovery period were reviewed. The laparoscopic approach with multiple port sites was reviewed. But she understands at any time during the surgery I may convert to a TAH which would mean a larger incision and a longer recovery period all of which she understands and accepts.The risks of infection, prolonged antibiotics, reoperation for abscess or hematoma drainage, incisional complications requiring opening and draining of incisions, closure by secondary intention and long-term issues of hernia and cosmetics/keloid formation discussed.  The risks of hemorrhage necessitating transfusion and the risks of transfusion discussed to include transfusion reaction, hepatitis, HIV, mad cow disease and other unknown entities was reviewed. The risk of inadvertent injury to internal organs recognizing her history of prior surgery including bowel, bladder, ureters, vessels and nerves either immediately recognized or delay recognized requiring major exploratory  reparative surgeries and future reparative surgeries including ostomy formation, bowel resection, bladder repair, ureteral damage repair was  all discussed understood and accepted. The patient's questions were answered to her satisfaction and she is ready to proceed with surgery.    Dara Lords MD, 5:05 PM 05/23/2013

## 2013-05-23 NOTE — Patient Instructions (Signed)
Followup for surgery as scheduled. 

## 2013-05-27 MED ORDER — CIPROFLOXACIN IN D5W 400 MG/200ML IV SOLN
400.0000 mg | INTRAVENOUS | Status: AC
  Administered 2013-05-28: 400 mg via INTRAVENOUS
  Filled 2013-05-27: qty 200

## 2013-05-27 MED ORDER — CLINDAMYCIN PHOSPHATE 900 MG/50ML IV SOLN
900.0000 mg | INTRAVENOUS | Status: AC
  Administered 2013-05-28: 900 mg via INTRAVENOUS
  Filled 2013-05-27: qty 50

## 2013-05-27 NOTE — Anesthesia Preprocedure Evaluation (Addendum)
Anesthesia Evaluation  Patient identified by MRN, date of birth, ID band Patient awake    Reviewed: Allergy & Precautions, H&P , Patient's Chart, lab work & pertinent test results, reviewed documented beta blocker date and time   Airway Mallampati: II TM Distance: >3 FB Neck ROM: full    Dental no notable dental hx.    Pulmonary  breath sounds clear to auscultation  Pulmonary exam normal       Cardiovascular hypertension, On Medications and On Home Beta Blockers Rhythm:regular Rate:Normal     Neuro/Psych    GI/Hepatic GERD-  Medicated,  Endo/Other    Renal/GU      Musculoskeletal   Abdominal   Peds  Hematology   Anesthesia Other Findings Reports Normal MUGA, Denies angina: cleared by Cardiology Sl chip on front tooth  Reproductive/Obstetrics                           Anesthesia Physical Anesthesia Plan  ASA: II  Anesthesia Plan: General   Post-op Pain Management:    Induction: Intravenous  Airway Management Planned: Oral ETT  Additional Equipment:   Intra-op Plan:   Post-operative Plan:   Informed Consent: I have reviewed the patients History and Physical, chart, labs and discussed the procedure including the risks, benefits and alternatives for the proposed anesthesia with the patient or authorized representative who has indicated his/her understanding and acceptance.   Dental Advisory Given and Dental advisory given  Plan Discussed with: CRNA and Surgeon  Anesthesia Plan Comments: (  Discussed  general anesthesia, including possible nausea, instrumentation of airway, sore throat,pulmonary aspiration, etc. I asked if the were any outstanding questions, or  concerns before we proceeded. )        Anesthesia Quick Evaluation

## 2013-05-28 ENCOUNTER — Ambulatory Visit (HOSPITAL_COMMUNITY)
Admission: RE | Admit: 2013-05-28 | Discharge: 2013-05-30 | Disposition: A | Discharge status: Home / Self Care | Payer: PRIVATE HEALTH INSURANCE | Source: Ambulatory Visit | Attending: Gynecology | Admitting: Gynecology

## 2013-05-28 ENCOUNTER — Encounter (HOSPITAL_COMMUNITY)
Admission: RE | Disposition: A | Discharge status: Home / Self Care | Payer: Self-pay | Source: Ambulatory Visit | Attending: Gynecology

## 2013-05-28 ENCOUNTER — Encounter (HOSPITAL_COMMUNITY): Payer: Self-pay | Admitting: Anesthesiology

## 2013-05-28 ENCOUNTER — Encounter (HOSPITAL_COMMUNITY): Payer: Self-pay | Admitting: *Deleted

## 2013-05-28 ENCOUNTER — Ambulatory Visit (HOSPITAL_COMMUNITY): Payer: PRIVATE HEALTH INSURANCE | Admitting: Anesthesiology

## 2013-05-28 DIAGNOSIS — N803 Endometriosis of pelvic peritoneum, unspecified: Secondary | ICD-10-CM | POA: Insufficient documentation

## 2013-05-28 DIAGNOSIS — N871 Moderate cervical dysplasia: Secondary | ICD-10-CM | POA: Insufficient documentation

## 2013-05-28 DIAGNOSIS — N882 Stricture and stenosis of cervix uteri: Secondary | ICD-10-CM | POA: Insufficient documentation

## 2013-05-28 DIAGNOSIS — N879 Dysplasia of cervix uteri, unspecified: Secondary | ICD-10-CM

## 2013-05-28 DIAGNOSIS — N8000 Endometriosis of the uterus, unspecified: Secondary | ICD-10-CM

## 2013-05-28 DIAGNOSIS — R6889 Other general symptoms and signs: Secondary | ICD-10-CM

## 2013-05-28 DIAGNOSIS — T884XXA Failed or difficult intubation, initial encounter: Secondary | ICD-10-CM

## 2013-05-28 DIAGNOSIS — D251 Intramural leiomyoma of uterus: Secondary | ICD-10-CM | POA: Insufficient documentation

## 2013-05-28 HISTORY — PX: ABDOMINAL HYSTERECTOMY: SHX81

## 2013-05-28 HISTORY — PX: SALPINGOOPHORECTOMY: SHX82

## 2013-05-28 HISTORY — PX: CYSTOSCOPY: SHX5120

## 2013-05-28 HISTORY — DX: Failed or difficult intubation, initial encounter: T88.4XXA

## 2013-05-28 SURGERY — SALPINGO-OOPHORECTOMY, OPEN
Anesthesia: General | Site: Urethra | Wound class: Clean Contaminated

## 2013-05-28 MED ORDER — INDIGOTINDISULFONATE SODIUM 8 MG/ML IJ SOLN
INTRAMUSCULAR | Status: AC
  Filled 2013-05-28: qty 5

## 2013-05-28 MED ORDER — AMLODIPINE BESYLATE 2.5 MG PO TABS
2.5000 mg | ORAL_TABLET | Freq: Every day | ORAL | Status: DC
  Administered 2013-05-29 – 2013-05-30 (×2): 2.5 mg via ORAL
  Filled 2013-05-28 (×3): qty 1

## 2013-05-28 MED ORDER — DIPHENHYDRAMINE HCL 50 MG/ML IJ SOLN: 12.5000 mg | Freq: Four times a day (QID) | INTRAMUSCULAR | Status: DC | PRN

## 2013-05-28 MED ORDER — 0.9 % SODIUM CHLORIDE (POUR BTL) OPTIME
TOPICAL | Status: DC | PRN
  Administered 2013-05-28: 1000 mL

## 2013-05-28 MED ORDER — DEXAMETHASONE SODIUM PHOSPHATE 10 MG/ML IJ SOLN
INTRAMUSCULAR | Status: AC
  Filled 2013-05-28: qty 1

## 2013-05-28 MED ORDER — HYDROMORPHONE HCL PF 1 MG/ML IJ SOLN
0.2500 mg | INTRAMUSCULAR | Status: DC | PRN
  Administered 2013-05-28 (×2): 0.5 mg via INTRAVENOUS

## 2013-05-28 MED ORDER — PROPOFOL 10 MG/ML IV EMUL
INTRAVENOUS | Status: AC
  Filled 2013-05-28: qty 20

## 2013-05-28 MED ORDER — ONDANSETRON HCL 4 MG/2ML IJ SOLN
4.0000 mg | Freq: Four times a day (QID) | INTRAMUSCULAR | Status: DC | PRN
  Administered 2013-05-28: 4 mg via INTRAVENOUS
  Filled 2013-05-28: qty 2

## 2013-05-28 MED ORDER — ONDANSETRON HCL 4 MG/2ML IJ SOLN
INTRAMUSCULAR | Status: AC
  Filled 2013-05-28: qty 2

## 2013-05-28 MED ORDER — PROPOFOL 10 MG/ML IV BOLUS
INTRAVENOUS | Status: DC | PRN
  Administered 2013-05-28: 200 mg via INTRAVENOUS
  Administered 2013-05-28: 50 mg via INTRAVENOUS

## 2013-05-28 MED ORDER — DEXTROSE-NACL 5-0.9 % IV SOLN
INTRAVENOUS | Status: DC
  Administered 2013-05-28 – 2013-05-29 (×2): via INTRAVENOUS

## 2013-05-28 MED ORDER — LACTATED RINGERS IV SOLN
INTRAVENOUS | Status: DC
  Administered 2013-05-28 (×4): via INTRAVENOUS

## 2013-05-28 MED ORDER — DEXTROSE-NACL 5-0.9 % IV SOLN: INTRAVENOUS | Status: DC

## 2013-05-28 MED ORDER — MIDAZOLAM HCL 2 MG/2ML IJ SOLN
INTRAMUSCULAR | Status: AC
  Filled 2013-05-28: qty 2

## 2013-05-28 MED ORDER — ONDANSETRON HCL 4 MG/2ML IJ SOLN
INTRAMUSCULAR | Status: DC | PRN
  Administered 2013-05-28: 4 mg via INTRAVENOUS

## 2013-05-28 MED ORDER — NEOSTIGMINE METHYLSULFATE 1 MG/ML IJ SOLN
INTRAMUSCULAR | Status: DC | PRN
  Administered 2013-05-28: 3 mg via INTRAVENOUS

## 2013-05-28 MED ORDER — FENTANYL CITRATE 0.05 MG/ML IJ SOLN
INTRAMUSCULAR | Status: DC | PRN
  Administered 2013-05-28: 50 ug via INTRAVENOUS
  Administered 2013-05-28 (×2): 100 ug via INTRAVENOUS

## 2013-05-28 MED ORDER — GLYCOPYRROLATE 0.2 MG/ML IJ SOLN
INTRAMUSCULAR | Status: AC
  Filled 2013-05-28: qty 2

## 2013-05-28 MED ORDER — BUPIVACAINE HCL (PF) 0.25 % IJ SOLN
INTRAMUSCULAR | Status: AC
  Filled 2013-05-28: qty 30

## 2013-05-28 MED ORDER — NEBIVOLOL HCL 2.5 MG PO TABS
2.5000 mg | ORAL_TABLET | Freq: Every day | ORAL | Status: DC
Start: 2013-05-28 — End: 2013-05-30
  Administered 2013-05-29: 10:00:00 via ORAL
  Administered 2013-05-30: 2.5 mg via ORAL
  Filled 2013-05-28 (×3): qty 1

## 2013-05-28 MED ORDER — SODIUM CHLORIDE 0.9 % IJ SOLN: 9.0000 mL | INTRAMUSCULAR | Status: DC | PRN

## 2013-05-28 MED ORDER — LIDOCAINE-EPINEPHRINE 1 %-1:100000 IJ SOLN
INTRAMUSCULAR | Status: DC | PRN
  Administered 2013-05-28: 20 mL

## 2013-05-28 MED ORDER — DIPHENHYDRAMINE HCL 12.5 MG/5ML PO ELIX: 12.5000 mg | ORAL_SOLUTION | Freq: Four times a day (QID) | ORAL | Status: DC | PRN

## 2013-05-28 MED ORDER — HEMOSTATIC AGENTS (NO CHARGE) OPTIME
TOPICAL | Status: DC | PRN
  Administered 2013-05-28: 1

## 2013-05-28 MED ORDER — HYDROMORPHONE HCL PF 1 MG/ML IJ SOLN
INTRAMUSCULAR | Status: AC
  Filled 2013-05-28: qty 1

## 2013-05-28 MED ORDER — PANTOPRAZOLE SODIUM 40 MG PO TBEC
40.0000 mg | DELAYED_RELEASE_TABLET | Freq: Every day | ORAL | Status: DC
  Administered 2013-05-28 – 2013-05-30 (×3): 40 mg via ORAL
  Filled 2013-05-28 (×3): qty 1

## 2013-05-28 MED ORDER — INDIGOTINDISULFONATE SODIUM 8 MG/ML IJ SOLN
INTRAMUSCULAR | Status: DC | PRN
  Administered 2013-05-28: 4 mL via INTRAVENOUS

## 2013-05-28 MED ORDER — MIDAZOLAM HCL 5 MG/5ML IJ SOLN
INTRAMUSCULAR | Status: DC | PRN
  Administered 2013-05-28: 2 mg via INTRAVENOUS

## 2013-05-28 MED ORDER — HYDROMORPHONE HCL PF 1 MG/ML IJ SOLN
INTRAMUSCULAR | Status: DC | PRN
  Administered 2013-05-28: 1 mg via INTRAVENOUS

## 2013-05-28 MED ORDER — NALOXONE HCL 0.4 MG/ML IJ SOLN: 0.4000 mg | INTRAMUSCULAR | Status: DC | PRN

## 2013-05-28 MED ORDER — ACETAMINOPHEN 10 MG/ML IV SOLN
INTRAVENOUS | Status: DC | PRN
  Administered 2013-05-28: 1000 mg via INTRAVENOUS

## 2013-05-28 MED ORDER — FENTANYL CITRATE 0.05 MG/ML IJ SOLN
INTRAMUSCULAR | Status: AC
  Filled 2013-05-28: qty 5

## 2013-05-28 MED ORDER — STERILE WATER FOR IRRIGATION IR SOLN
Status: DC | PRN
  Administered 2013-05-28: 1000 mL

## 2013-05-28 MED ORDER — GLYCOPYRROLATE 0.2 MG/ML IJ SOLN
INTRAMUSCULAR | Status: DC | PRN
  Administered 2013-05-28: .4 mg via INTRAVENOUS

## 2013-05-28 MED ORDER — LIDOCAINE HCL (CARDIAC) 20 MG/ML IV SOLN
INTRAVENOUS | Status: AC
  Filled 2013-05-28: qty 5

## 2013-05-28 MED ORDER — DEXAMETHASONE SODIUM PHOSPHATE 4 MG/ML IJ SOLN
INTRAMUSCULAR | Status: DC | PRN
  Administered 2013-05-28: 10 mg via INTRAVENOUS

## 2013-05-28 MED ORDER — ROCURONIUM BROMIDE 100 MG/10ML IV SOLN
INTRAVENOUS | Status: DC | PRN
  Administered 2013-05-28: 30 mg via INTRAVENOUS
  Administered 2013-05-28: 10 mg via INTRAVENOUS
  Administered 2013-05-28 (×2): 5 mg via INTRAVENOUS

## 2013-05-28 MED ORDER — NEOSTIGMINE METHYLSULFATE 1 MG/ML IJ SOLN
INTRAMUSCULAR | Status: AC
  Filled 2013-05-28: qty 1

## 2013-05-28 MED ORDER — ROCURONIUM BROMIDE 50 MG/5ML IV SOLN
INTRAVENOUS | Status: AC
  Filled 2013-05-28: qty 1

## 2013-05-28 MED ORDER — BUPIVACAINE HCL (PF) 0.25 % IJ SOLN
INTRAMUSCULAR | Status: DC | PRN
  Administered 2013-05-28: 8 mL

## 2013-05-28 MED ORDER — MORPHINE SULFATE (PF) 1 MG/ML IV SOLN
INTRAVENOUS | Status: DC
  Administered 2013-05-28: 13:00:00 via INTRAVENOUS
  Administered 2013-05-28: 3 mL via INTRAVENOUS
  Administered 2013-05-28: 12 mg via INTRAVENOUS
  Administered 2013-05-28: 4 mL via INTRAVENOUS
  Administered 2013-05-29: 1.5 mg via INTRAVENOUS
  Administered 2013-05-29: 4.5 mL via INTRAVENOUS
  Filled 2013-05-28: qty 25

## 2013-05-28 MED ORDER — HYDROMORPHONE HCL PF 1 MG/ML IJ SOLN
INTRAMUSCULAR | Status: AC
  Administered 2013-05-28: 0.25 mg via INTRAVENOUS
  Filled 2013-05-28: qty 1

## 2013-05-28 MED ORDER — LIDOCAINE HCL (CARDIAC) 20 MG/ML IV SOLN
INTRAVENOUS | Status: DC | PRN
  Administered 2013-05-28: 75 mg via INTRAVENOUS

## 2013-05-28 SURGICAL SUPPLY — 50 items
APL SKNCLS STERI-STRIP NONHPOA (GAUZE/BANDAGES/DRESSINGS) ×8
BENZOIN TINCTURE PRP APPL 2/3 (GAUZE/BANDAGES/DRESSINGS) ×4 IMPLANT
BLADE SURG 10 STRL SS (BLADE) ×2 IMPLANT
CABLE HIGH FREQUENCY MONO STRZ (ELECTRODE) IMPLANT
CLOTH BEACON ORANGE TIMEOUT ST (SAFETY) ×5 IMPLANT
CONT PATH 16OZ SNAP LID 3702 (MISCELLANEOUS) ×5 IMPLANT
COVER MAYO STAND STRL (DRAPES) ×5 IMPLANT
COVER TABLE BACK 60X90 (DRAPES) ×5 IMPLANT
DECANTER SPIKE VIAL GLASS SM (MISCELLANEOUS) IMPLANT
ELECT REM PT RETURN 9FT ADLT (ELECTROSURGICAL)
ELECTRODE REM PT RTRN 9FT ADLT (ELECTROSURGICAL) IMPLANT
GLOVE BIO SURGEON STRL SZ7.5 (GLOVE) ×10 IMPLANT
GOWN STRL REIN XL XLG (GOWN DISPOSABLE) ×20 IMPLANT
HEMOSTAT SURGICEL 4X8 (HEMOSTASIS) ×4 IMPLANT
NDL SAFETY ECLIPSE 18X1.5 (NEEDLE) ×2 IMPLANT
NDL SPNL 18GX3.5 QUINCKE PK (NEEDLE) ×3 IMPLANT
NEEDLE HYPO 18GX1.5 SHARP (NEEDLE) ×10
NEEDLE MAYO .5 CIRCLE (NEEDLE) IMPLANT
NEEDLE SPNL 18GX3.5 QUINCKE PK (NEEDLE) ×5 IMPLANT
NS IRRIG 1000ML POUR BTL (IV SOLUTION) ×9 IMPLANT
PACK LAVH (CUSTOM PROCEDURE TRAY) ×5 IMPLANT
PROTECTOR NERVE ULNAR (MISCELLANEOUS) ×5 IMPLANT
SCALPEL HARMONIC ACE (MISCELLANEOUS) IMPLANT
SCISSORS LAP 5X35 DISP (ENDOMECHANICALS) IMPLANT
SET CYSTO W/LG BORE CLAMP LF (SET/KITS/TRAYS/PACK) IMPLANT
SET IRRIG TUBING LAPAROSCOPIC (IRRIGATION / IRRIGATOR) IMPLANT
SOLUTION ELECTROLUBE (MISCELLANEOUS) IMPLANT
SPONGE LAP 18X18 X RAY DECT (DISPOSABLE) ×10 IMPLANT
STRIP CLOSURE SKIN 1/2X4 (GAUZE/BANDAGES/DRESSINGS) ×4 IMPLANT
STRIP CLOSURE SKIN 1/4X3 (GAUZE/BANDAGES/DRESSINGS) IMPLANT
SURGIFLO W/THROMBIN 8M KIT (HEMOSTASIS) ×2 IMPLANT
SUT PLAIN 4 0 FS 2 27 (SUTURE) ×5 IMPLANT
SUT VIC AB 0 CT1 18XCR BRD8 (SUTURE) ×12 IMPLANT
SUT VIC AB 0 CT1 27 (SUTURE) ×15
SUT VIC AB 0 CT1 27XBRD ANBCTR (SUTURE) ×6 IMPLANT
SUT VIC AB 0 CT1 8-18 (SUTURE) ×15
SUT VIC AB 2-0 CT1 27 (SUTURE) ×15
SUT VIC AB 2-0 CT1 TAPERPNT 27 (SUTURE) ×3 IMPLANT
SUT VIC AB 2-0 SH 27 (SUTURE) ×20
SUT VIC AB 2-0 SH 27XBRD (SUTURE) ×7 IMPLANT
SUT VIC AB 4-0 KS 27 (SUTURE) ×2 IMPLANT
SUT VICRYL 0 TIES 12 18 (SUTURE) ×5 IMPLANT
SUT VICRYL 0 UR6 27IN ABS (SUTURE) ×5 IMPLANT
SYR BULB IRRIGATION 50ML (SYRINGE) ×5 IMPLANT
TOWEL OR 17X24 6PK STRL BLUE (TOWEL DISPOSABLE) ×10 IMPLANT
TRAY FOLEY CATH 14FR (SET/KITS/TRAYS/PACK) ×5 IMPLANT
TROCAR XCEL NON-BLD 11X100MML (ENDOMECHANICALS) ×5 IMPLANT
TROCAR XCEL NON-BLD 5MMX100MML (ENDOMECHANICALS) IMPLANT
TROCAR XCEL OPT SLVE 5M 100M (ENDOMECHANICALS) IMPLANT
WATER STERILE IRR 1000ML POUR (IV SOLUTION) ×3 IMPLANT

## 2013-05-28 NOTE — Progress Notes (Signed)
Patient ID: AVICE Berger, female   DOB: Aug 10, 1956, 57 y.o.   MRN: [id number] Cynthia Berger [birth year] [id number]   Day of Surgery s/p Procedure(s): BILATERAL SALPINGO OOPHORECTOMY HYSTERECTOMY ABDOMINAL CYSTOSCOPY  Subjective: Pt doing well.  Awake, alert with good pain relief.  Objective: Vital signs in last 24 hours: Temp:  [97.5 F (36.4 C)-97.9 F (36.6 C)] 97.7 F (36.5 C) (06/10 1600) Pulse Rate:  [50-67] 55 (06/10 1600) Resp:  [11-18] 13 (06/10 1600) BP: (116-167)/(64-91) 166/90 mmHg (06/10 1600) SpO2:  [97 %-100 %] 100 % (06/10 1600) Weight:  [174 lb (78.926 kg)] 174 lb (78.926 kg) (06/10 1304)    EXAM General: awake, no distress Resp: rhonchi clear to auscultation bilaterally Cardio: regular rate and rhythm GI: soft, minimally tender, bowel sounds absent, incision dry intact Lower Extremities: Without swelling or tenderness Vaginal Bleeding: Reported absent  Assessment: s/p Procedure(s): BILATERAL SALPINGO OOPHORECTOMY HYSTERECTOMY ABDOMINAL CYSTOSCOPY: stable, doing well.  Urine output great with clear yellow urine.  Good pain relief with PCA.  Reviewed results of surgery to include need for TAH. Dressing off for ? reason.  Daughter states come off when they changed dressing.  Incision dry intact.  Plan: Continue routine post operative care.  Advance post op care.  Possible discharge tomorrow.  LOS: 0 days    Dara Lords, MD 05/28/2013 5:03 PM

## 2013-05-28 NOTE — Anesthesia Postprocedure Evaluation (Signed)
  Anesthesia Post-op Note  Patient: Cynthia Berger  Procedure(s) Performed: Procedure(s): BILATERAL SALPINGO OOPHORECTOMY (Bilateral) HYSTERECTOMY ABDOMINAL (N/A) CYSTOSCOPY (N/A)  Patient Location: PACU and Women's Unit  Anesthesia Type:General  Level of Consciousness: awake, alert  and oriented  Airway and Oxygen Therapy: Patient Spontanous Breathing and Patient connected to nasal cannula oxygen  Post-op Pain: mild  Post-op Assessment: Patient's Cardiovascular Status Stable, Respiratory Function Stable, No signs of Nausea or vomiting and Pain level controlled  Post-op Vital Signs: stable  Complications: No apparent anesthesia complications

## 2013-05-28 NOTE — Transfer of Care (Signed)
Immediate Anesthesia Transfer of Care Note  Patient: Cynthia Berger  Procedure(s) Performed: Procedure(s): BILATERAL SALPINGO OOPHORECTOMY (Bilateral) HYSTERECTOMY ABDOMINAL (N/A) CYSTOSCOPY (N/A)  Patient Location: PACU  Anesthesia Type:General  Level of Consciousness: awake, alert  and oriented  Airway & Oxygen Therapy: Patient Spontanous Breathing and Patient connected to nasal cannula oxygen  Post-op Assessment: Report given to PACU RN and Post -op Vital signs reviewed and stable  Post vital signs: stable  Complications: No apparent anesthesia complications

## 2013-05-28 NOTE — Anesthesia Postprocedure Evaluation (Signed)
Anesthesia Post Note  Patient: Cynthia Berger  Procedure(s) Performed: Procedure(s) (LRB): BILATERAL SALPINGO OOPHORECTOMY (Bilateral) HYSTERECTOMY ABDOMINAL (N/A) CYSTOSCOPY (N/A)  Anesthesia type: GA  Patient location: PACU  Post pain: Pain level controlled  Post assessment: Post-op Vital signs reviewed  Last Vitals:  Filed Vitals:   05/28/13 1144  BP:   Pulse: 59  Temp: 36.5 C  Resp: 16    Post vital signs: Reviewed  Level of consciousness: sedated  Complications: No apparent anesthesia complications

## 2013-05-28 NOTE — H&P (Signed)
  The patient was examined.  I reviewed the proposed surgery and consent form with the patient.  The dictated history and physical is current and accurate and all questions were answered. The patient is ready to proceed with surgery and has a realistic understanding and expectation for the outcome.   Dara Lords MD, 7:09 AM 05/28/2013

## 2013-05-28 NOTE — Op Note (Signed)
Cynthia Berger June 03, 1956 [id number]   Post Operative Note   Date of surgery:  05/28/2013  Pre Op Dx:  Persistent cervical dysplasia  Post Op Dx:  Persistent cervical dysplasia, cervical stenosis, endometriosis  Procedure:  Total, hysterectomy, bilateral salpingo-oophorectomy, cystoscopy  Surgeon:  Dara Lords  Assistant:  Reynaldo Minium  Anesthesia:  General  EBL:  450 cc  Complications:  None  Specimen:  Uterus, right and left fallopian tubes segments, right and left ovaries to pathology  Findings: EUA:  External BUS vagina normal. Cervix scarred. Uterus grossly normal size midline mobile. Adnexa without masses   Operative:  Anterior cul-de-sac peritoneal/vesicouterine scarring. Posterior cul-de-sac with several classic endometriotic powder burn implants. Uterus grossly normal in size and shape. Right and left ovaries grossly normal. Right and left fallopian tubes grossly normal. Upper abdominal exam with liver palpably smooth. No upper abdominal adhesions palpated. Appendix grossly normal free and mobile.  Procedure:  Patient was taken to the operating room, underwent general anesthesia, was placed in the low dorsal lithotomy position, received an abdominal/perineal/vaginal preparation with Betadine solution and an EUA was performed. A timeout was performed by the surgical team. The cervix was visualized with a speculum and the anterior lip grasped with a single-tooth tenaculum. Multiple attempts to negotiate the external os were made to include using the wire dilators and I was unable to negotiate the cervical canal due to the scarring. As the procedure was being performed for persistent cervical dysplasia it was decided at this point to proceed with a total abdominal hysterectomy to preserve cervical integrity and avoid false channeling with further attempts at cervical os location. The speculum and tenaculum were removed, a Foley catheter placed in sterile technique and the  patient was draped in the usual fashion. The abdomen was then sharply entered through a Pfannenstiel incision achieving adequate hemostasis all levels. A Balfour retractor and bladder blade were placed within the incision and the intestines were packed from the operative field. The uterus was elevated with Kelly clamps applied to the uterine ovarian pedicles and the right round ligament was identified, ligated and incised. The peritoneal reflection overlying the infundibulopelvic ligament and vessels was incised, a window created and the vessels and pedicle were doubly clamped cut and doubly ligated using 0 Vicryl suture. The anterior vesicouterine peritoneal fold was then sharply and bluntly developed noting significant adhesions to the anterior uterine surface and surrounding tissues. The left round ligament was identified and transected with electrocautery and the peritoneal reflection overlying the infundibulopelvic ligament and vessels was sharply incised. The left ureter was identified on the pelvic sidewall and had normal peristalsis away from the operative field. The infundibulopelvic ligament and vessels were then doubly clamped cut and doubly ligated using 0 Vicryl suture. The anterior vesicouterine peritoneal fold was again sharply incised and developed sharply and bluntly again noting a fair amount of adhesions. The right and left uterine vessels were skeletonized clamped cut and ligated using 0 Vicryl suture and the uterus was progressively freed from its attachments through clamping cutting and ligating of the parametrial tissues and paracervical tissues using 0 Vicryl suture continually mobilizing the bladder to the level of the upper vagina. The vagina was then cross clamped and the uterus excised with inspection of the cervix showing complete removal. Right and left angle sutures were placed using 0 Vicryl suture and tagged for future reference. An intervening figure-of-eight suture was placed to  close the vagina anterior to posterior using 0 Vicryl suture. The pelvis was then  copiously irrigated and the left ureter again inspected showing normal caliber and peristalsis. Reinspection of the cul-de-sac did not show evidence of the prior noted endometriotic implants. Several small bleeding points along the pelvis were addressed initially with electrocautery. She had some oozing to the left of the vaginal cuff and initially Surgicel was placed but continued oozing was noted. Due to the location and fear of ureteral damage no cautery or suturing was performed in this area but subsequently SurgiFlo was placed achieving ultimate hemostasis. The packing was then removed, upper abdominal exam performed and the anterior fascia was reapproximated using 0 Vicryl suture in a running interlocking stitch starting at the angle and meeting in the middle. The subcutaneous tissues were irrigated with hemostasis achieved with electrocautery and the skin was reapproximated using 4-0 Vicryl in a running subcuticular stitch. Benzoin with Steri-Strips were applied and the skin incision was injected using 0.25% Marcaine, total of 8 cc. At this point, due to the history of solitary kidney cystoscopy was performed using the 30 cystoscope after Indigo Carmine was administered. The Foley catheter was removed and cystoscopy showed a normal-appearing bladder with good distention and a left ureteral orifice with good peristaltic jets. The right ureteral orifice could not be located. The Foley catheter was replaced, the patient awakened without difficulty and taken to the recovery room good condition having tolerated the procedure well. Abundant clear yellow urine output was noted throughout the case.    Dara Lords MD, 1:45 PM 05/28/2013

## 2013-05-29 ENCOUNTER — Encounter (HOSPITAL_COMMUNITY): Payer: Self-pay | Admitting: Gynecology

## 2013-05-29 LAB — CBC
HCT: 31.4 % — ABNORMAL LOW (ref 36.0–46.0)
Hemoglobin: 10.2 g/dL — ABNORMAL LOW (ref 12.0–15.0)
MCH: 28.8 pg (ref 26.0–34.0)
MCHC: 32.5 g/dL (ref 30.0–36.0)
RBC: 3.54 MIL/uL — ABNORMAL LOW (ref 3.87–5.11)

## 2013-05-29 MED ORDER — IBUPROFEN 800 MG PO TABS: 800.0000 mg | ORAL_TABLET | Freq: Three times a day (TID) | ORAL | Status: DC

## 2013-05-29 MED ORDER — HYDROCODONE-ACETAMINOPHEN 5-325 MG PO TABS
2.0000 | ORAL_TABLET | ORAL | Status: DC | PRN
  Administered 2013-05-29 – 2013-05-30 (×4): 2 via ORAL
  Filled 2013-05-29 (×4): qty 2
  Filled 2013-05-29: qty 1

## 2013-05-29 NOTE — Progress Notes (Signed)
Patient ID: Cynthia Berger, female   DOB: 08-Aug-1956, 57 y.o.   MRN: [id number] Cynthia Berger 08-20-1956 [id number]   1 Day Post-Op s/p Procedure(s): BILATERAL SALPINGO OOPHORECTOMY HYSTERECTOMY ABDOMINAL CYSTOSCOPY  Subjective: Patient reports feels well, no acute distress, pain severity reported a 3 on a scale of 0-10, yes taking PO, foley catheter out, no voiding, yes ambulating, no passing flatus  Objective: Vital signs in last 24 hours: Temp:  [97.5 F (36.4 C)-98.5 F (36.9 C)] 98.4 F (36.9 C) (06/11 0536) Pulse Rate:  [50-67] 59 (06/11 0536) Resp:  [11-18] 16 (06/11 0600) BP: (116-171)/(64-91) 171/82 mmHg (06/11 0536) SpO2:  [97 %-100 %] 100 % (06/11 0600) Weight:  [174 lb (78.926 kg)] 174 lb (78.926 kg) (06/10 1304) Last BM Date: 05/28/13  EXAM General: awake, alert and no distress Resp: rhonchi clear to auscultation bilaterally Cardio: regular rate and rhythm, S1, S2 normal, no murmur, click, rub or gallop GI: soft, minimal tender, bowel sounds present, incision dry intact Lower Extremities: Without swelling or tenderness Vaginal Bleeding: Pad with mild-moderate blood  Lab Results:   Recent Labs  05/29/13 0550  WBC 12.4*  HGB 10.2*  HCT 31.4*  PLT 234    Assessment: s/p Procedure(s): BILATERAL SALPINGO OOPHORECTOMY HYSTERECTOMY ABDOMINAL CYSTOSCOPY: stable and progressing well  Plan: Continue routine post operative care, Advance diet Encourage ambulation Advance to PO medication Discontinue IV fluids Reassess later today for possible discharge   LOS: 1 day    Dara Lords, MD 05/29/2013 7:18 AM

## 2013-05-29 NOTE — Progress Notes (Signed)
Patient ID: Cynthia Berger, female   DOB: 1956/02/28, 57 y.o.   MRN: [id number] JEWELL HAUGHT 06-13-1956 [id number]   1 Day Post-Op s/p Procedure(s): BILATERAL SALPINGO OOPHORECTOMY HYSTERECTOMY ABDOMINAL CYSTOSCOPY  Subjective: Patient reports feels well, pain severity reported a 2 on a scale of 0-10, yes taking PO, foley catheter out, yes voiding, yes ambulating, no passing flatus  Objective: Vital signs in last 24 hours: Temp:  [97.7 F (36.5 C)-98.5 F (36.9 C)] 97.7 F (36.5 C) (06/11 1400) Pulse Rate:  [58-68] 58 (06/11 1400) Resp:  [12-16] 16 (06/11 1400) BP: (128-171)/(61-82) 128/61 mmHg (06/11 1400) SpO2:  [98 %-100 %] 100 % (06/11 1400) Last BM Date: 05/28/13  EXAM General: awake, alert and no distress Resp: rhonchi clear to auscultation bilaterally Cardio: regular rate and rhythm, S1, S2 normal, no murmur, click, rub or gallop GI: soft, minimal tender, bowel sounds active, incisions dry intact Lower Extremities: Without swelling or tenderness Vaginal Bleeding: Reported scant  Lab Results:   Recent Labs  05/29/13 0550  WBC 12.4*  HGB 10.2*  HCT 31.4*  PLT 234    Assessment: s/p Procedure(s): BILATERAL SALPINGO OOPHORECTOMY HYSTERECTOMY ABDOMINAL CYSTOSCOPY: progressing well  Plan: Continue routine post operative care, plan discharge in AM.  Precautions, instruction, follow up reviewed now.  Questions answered.  LOS: 1 day    Dara Lords, MD 05/29/2013 6:07 PM

## 2013-05-30 MED ORDER — HYDROCODONE-ACETAMINOPHEN 5-325 MG PO TABS: 2.0000 | ORAL_TABLET | ORAL | Status: DC | PRN

## 2013-05-30 NOTE — Progress Notes (Signed)
Patient ID: Cynthia Berger, female   DOB: [birth year], 57 y.o.   MRN: [id number] ALYXANDRIA WENTZ 01/31/1956 [id number]   2 Days Post-Op Procedure(s) (LRB): BILATERAL SALPINGO OOPHORECTOMY (Bilateral) HYSTERECTOMY ABDOMINAL (N/A) CYSTOSCOPY (N/A)  Subjective: Patient reports feels well, pain severity reported mild, yes taking PO, foley catheter out, yes voiding, yes ambulating, yes passing flatus  Objective: Vital signs in last 24 hours: Temp:  [97.7 F (36.5 C)-98.3 F (36.8 C)] 97.8 F (36.6 C) (06/12 0533) Pulse Rate:  [57-68] 58 (06/12 0533) Resp:  [14-18] 18 (06/12 0533) BP: (111-137)/(57-67) 114/62 mmHg (06/12 0533) SpO2:  [97 %-100 %] 100 % (06/12 0533) Last BM Date: 05/28/13    EXAM General: awake, no distress Resp: rhonchi clear to auscultation bilaterally Cardio: regular rate and rhythm, S1, S2 normal, no murmur, click, rub or gallop GI: soft, non tender, bowel sounds active, incisions dry intact Lower Extremities: Without swelling or tenderness Vaginal Bleeding: Reported scant   Lab Results:   Recent Labs  05/29/13 0550  WBC 12.4*  HGB 10.2*  HCT 31.4*  PLT 234    Assessment: s/p Procedure(s): BILATERAL SALPINGO OOPHORECTOMY HYSTERECTOMY ABDOMINAL CYSTOSCOPY: progressing well, ready for discharge.    Plan: Discharge home today.  Precautions, instructions and follow up were discussed with the patient.  Prescriptions provided per AVS.  Patient to call the office to arrange a post-operative appointmant in 2 weeks.  Dara Lords, MD 05/30/2013 8:02 AM

## 2013-05-30 NOTE — Discharge Summary (Signed)
°  Postoperative Instructions Hysterectomy ° °Dr. Euel Castile and the nursing staff have discussed postoperative instructions with you.  If you have any questions please ask them before you leave the hospital, or call Dr Adrianne Shackleton’s office at [phone].   ° °We would like to emphasize the following instructions: ° ° °  Call the office to make your follow-up appointment as recommended by Dr Barbaraann Avans (usually 2 weeks). ° °  You were given a prescription, or one was ordered for you at the pharmacy you designated.  Get that prescription filled and take the medication according to instructions. ° °  You may eat a regular diet, but slowly until you start having bowel movements. ° °  Drink plenty of water daily. ° °  Nothing in the vagina (intercourse, douching, objects of any kind) until released by Dr Michele Judy. ° °  No driving for two weeks.  Wait to be cleared by Dr Mia Winthrop at your first post op check.  Car rides (short) are ok after several days at home, as long as you are not having significant pain, but no traveling out of town. ° °  You may shower, but no baths.  Walking up and down stairs is ok.  No heavy lifting, prolonged standing, repeated bending or any “working out” until your first post op check. ° °  Rest frequently, listen to your body and do not push yourself and overdo it. ° °  Call if: ° °o Your pain medication does not seem strong enough. °o Worsening pain or abdominal bloating °o Persistent nausea or vomiting °o Difficulty with urination or bowel movements. °o Temperature of 101 degrees or higher. °o Bleeding heavier then staining (clots or period type flow). °o Incisions become red, tender or begin to drain. °o You have any questions or concerns. °

## 2013-05-30 NOTE — Progress Notes (Signed)
Pt education done, Pt ambulated out of unit stable and had no pain

## 2013-06-03 NOTE — Discharge Summary (Signed)
  Cynthia Berger [birth year] [id number]   Discharge Summary  Date of Admission:  05/28/2013  Date of Discharge:  05/30/2013  Discharge Diagnosis:  Persistent cervical dysplasia, positive high risk HPV, leiomyoma  Procedure:  Procedure(s): BILATERAL SALPINGO OOPHORECTOMY HYSTERECTOMY ABDOMINAL CYSTOSCOPY  Pathology:GICAL PATHOLOGY Diagnosis Uterus and cervix, bilateral ovaries and fallopian tubes - INACTIVE PATTERN ENDOMETRIUM WITH MULTIPLE UNDERLYING LEIOMYOMATA. - BENIGN BILATERAL OVARIES AND FALLOPIAN TUBES. - THERE IS NO ATYPIA, HYPERPLASIA OR MALIGNANCY PRESENT WITHIN THE ENDOMETRIUM, MYOMETRIUM, OVARIES OR FALLOPIAN TUBES. - CERVIX SHOWS TRANSFORMATION ZONE MUCOSA WITH FOCAL KOILOCYTIC ATYPIA.  Hospital Course:  Patient underwent an uncomplicated TAH/BSO. She was discharged on postoperative day #2 ambulating well, tolerating a regular diet, voiding without difficulty and passing flatus. She had good pain relief with oral medication and had a postoperative hemoglobin of 10.2. The patient received instructions for postoperative care and call precautions.  She received prescriptions per AVS and will be seen in the office 2 weeks following discharge.       Dara Lords MD, 10:58 AM 06/03/2013

## 2013-06-04 ENCOUNTER — Telehealth: Payer: Self-pay

## 2013-06-04 NOTE — Telephone Encounter (Signed)
Dulcolax suppository

## 2013-06-04 NOTE — Telephone Encounter (Signed)
Patient informed. 

## 2013-06-04 NOTE — Telephone Encounter (Signed)
Had surgery last Tuesday 05/28/13.  Has not had a bowel movement since prior to surgery.  Has tried eating high fiber foods. She would like to know what you recommend?

## 2013-06-13 ENCOUNTER — Ambulatory Visit (INDEPENDENT_AMBULATORY_CARE_PROVIDER_SITE_OTHER): Payer: PRIVATE HEALTH INSURANCE | Admitting: Gynecology

## 2013-06-13 ENCOUNTER — Encounter: Payer: Self-pay | Admitting: Gynecology

## 2013-06-13 DIAGNOSIS — Z9889 Other specified postprocedural states: Secondary | ICD-10-CM

## 2013-06-13 NOTE — Progress Notes (Signed)
Postop check at 2 weeks status post TAH/BSO for persistent low-grade dysplasia. Reviewed final pathology which showed koilocytotic atypia but no other significant dysplasia. Patient has done well since surgery.  Exam was [employer] Abdomen with incision healed nicely. Remaining Steri-Strips removed. Soft nontender without masses guarding rebound organomegaly. Pelvic external BUS vagina normal with cuff healing nicely. Bimanual without masses or tenderness.  Assessment and plan: Two-week checkup status post TAH/BSO doing well. We'll continue with routine postoperative care and slowly resume normal activities with the exception of pelvic rest. Followup in 2 weeks for her next postoperative checkup.

## 2013-06-13 NOTE — Patient Instructions (Addendum)
Followup in 2 weeks for next postop checkup

## 2013-06-18 ENCOUNTER — Telehealth: Payer: Self-pay

## 2013-06-18 NOTE — Telephone Encounter (Signed)
Patient advised.

## 2013-06-18 NOTE — Telephone Encounter (Signed)
TAH,BSO 05/28/13.  Patient said she is going out of town and she would like to know if you think she would be okay to get into the whirlpool?

## 2013-06-18 NOTE — Telephone Encounter (Signed)
If private bathtub OK. If public i.e. hot tub etc. shared by other people I would avoid this for risk of infection

## 2013-06-27 ENCOUNTER — Encounter: Payer: Self-pay | Admitting: Gynecology

## 2013-06-27 ENCOUNTER — Ambulatory Visit (INDEPENDENT_AMBULATORY_CARE_PROVIDER_SITE_OTHER): Payer: PRIVATE HEALTH INSURANCE | Admitting: Gynecology

## 2013-06-27 VITALS — BP 144/80

## 2013-06-27 DIAGNOSIS — Z9889 Other specified postprocedural states: Secondary | ICD-10-CM

## 2013-06-27 NOTE — Patient Instructions (Signed)
Slowly resume activities. Continue with nothing in the vagina for 4 more weeks. Call with any issues. Otherwise followup in December when you're due for your annual exam.

## 2013-06-27 NOTE — Progress Notes (Signed)
Patient presents for her four-week postoperative visit status post TAH/BSO. Patient is doing well without complaints.  Exam with Blanca Asst. Abdomen with incision healed nicely. Soft nontender without masses guarding rebound or organomegaly. Pelvic external BUS vagina with cuff healing nicely. Bimanual without masses or tenderness.  Assessment and plan: Normal postoperative check. Slowly resume normal activities with the exception of continued pelvic rest for 4 more weeks. Followup in December 2014 for annual exam when due. Sooner if any issues.

## 2013-07-22 ENCOUNTER — Telehealth: Payer: Self-pay

## 2013-07-22 NOTE — Telephone Encounter (Signed)
Error in last note. Please ignore previous note.  Patient had TAH, BSO May 28, 2013.  She is ready to return to work today.  She wanted to make sure you have released her to work. She cleans for a living.

## 2013-07-22 NOTE — Telephone Encounter (Signed)
Patient is scheduled to go back to work today and wanted to be sure she has been released.  Surgery was June 10She said you told her four weeks out of work and to day is four weeks.

## 2013-07-22 NOTE — Telephone Encounter (Signed)
Patient informed. 

## 2013-07-22 NOTE — Telephone Encounter (Signed)
She is okay to return to work

## 2013-09-18 ENCOUNTER — Ambulatory Visit: Payer: PRIVATE HEALTH INSURANCE | Admitting: Endocrinology

## 2013-09-24 ENCOUNTER — Encounter: Payer: Self-pay | Admitting: Endocrinology

## 2013-09-24 ENCOUNTER — Ambulatory Visit (INDEPENDENT_AMBULATORY_CARE_PROVIDER_SITE_OTHER): Payer: PRIVATE HEALTH INSURANCE | Admitting: Endocrinology

## 2013-09-24 VITALS — BP 134/72 | HR 76 | Ht 63.0 in | Wt 181.0 lb

## 2013-09-24 DIAGNOSIS — E042 Nontoxic multinodular goiter: Secondary | ICD-10-CM

## 2013-09-24 NOTE — Progress Notes (Signed)
Subjective:    Patient ID: Cynthia Berger, female    DOB: 01-04-1956, 57 y.o.   MRN: [id number]  HPI Pt was noted in 2013 to have a multinodular goiter.  bx was low-risk.  She does not notice the goiter.  pt states she feels well in general.   Past Medical History  Diagnosis Date  . LGSIL (low grade squamous intraepithelial dysplasia) 2013     C&B Inadequate ECC--positive LGSIL --  . Vitamin D deficiency 11/2007    LOW AT 18  . AC (acromioclavicular) joint bone spurs     HEEL BONE SPURS-SURGERY 12/2010 LEFT FOOT  . Antral gastritis   . Positive H. pylori test   . Uterine fibroid   . GERD (gastroesophageal reflux disease)   . Hypertension   . H/O hiatal hernia   . PMB (postmenopausal bleeding)   . Heart palpitations   . DDD (degenerative disc disease), cervical   . History of kidney cancer 2005    S/P RIGHT NEPHRECTOMY   . Cancer     right kidney ca  . Thyroid nodule RIGHT LOBE-- PER BX NEGATIVE  . Hiatal hernia   . Esophageal dysmotility     Past Surgical History  Procedure Laterality Date  . Cesarean section  1993    W/ TUBAL LIGATION, BILATERAL  . Nephrectomy  2005    RIGHT KIDNEY CANCER  . Rotator cuff repair  2010    RIGHT  . Foot surgery  2012    left heel spur removed  . Transthoracic echocardiogram  04-10-2012  DR BRIEN CRENSHAW    LVSF MILDLY REDUCED/ EF 45-50%  . Hysteroscopy w/d&c  06/08/2012    Procedure: DILATATION AND CURETTAGE /HYSTEROSCOPY;  Surgeon: Dara Lords, MD;  Location: [facility];  Service: Gynecology;  Laterality: N/A;  WITH RESECTION OF MYOMA  . Cervical biopsy  w/ loop electrode excision  10/2012    for persistent low-grade dysplasia  . Tubal ligation    . Esophogeal dilatation    . Salpingoophorectomy Bilateral 05/28/2013    Procedure: BILATERAL SALPINGO OOPHORECTOMY;  Surgeon: Dara Lords, MD;  Location: [facility];  Service: Gynecology;  Laterality: Bilateral;  . Abdominal hysterectomy N/A 05/28/2013   Procedure: HYSTERECTOMY ABDOMINAL;  Surgeon: Dara Lords, MD;  Location: [facility];  Service: Gynecology;  Laterality: N/A;  . Cystoscopy N/A 05/28/2013    Procedure: CYSTOSCOPY;  Surgeon: Dara Lords, MD;  Location: [facility];  Service: Gynecology;  Laterality: N/A;    History   Social History  . Marital Status: Widowed    Spouse Name: N/A    Number of Children: 4  . Years of Education: N/A   Occupational History  . [employer]    Social History Main Topics  . Smoking status: Never Smoker   . Smokeless tobacco: Never Used  . Alcohol Use: Yes     Comment: occasional  . Drug Use: No  . Sexual Activity: Yes    Partners: Male    Birth Control/ Protection: Post-menopausal   Other Topics Concern  . Not on file   Social History Narrative  . No narrative on file    Current Outpatient Prescriptions on File Prior to Visit  Medication Sig Dispense Refill  . amLODipine (NORVASC) 2.5 MG tablet Take 2.5 mg by mouth daily.      Marland Kitchen esomeprazole (NEXIUM) 40 MG capsule Take 40 mg by mouth daily before breakfast.      . Multiple Vitamins-Minerals (MULTIVITAMIN PO) Take 1  tablet by mouth daily.       . nebivolol (BYSTOLIC) 5 MG tablet Take 2.5 mg by mouth daily.      . [DISCONTINUED] dicyclomine (BENTYL) 20 MG tablet Take 1 tablet (20 mg total) by mouth 2 (two) times daily.  40 tablet  0   No current facility-administered medications on file prior to visit.    Allergies  Allergen Reactions  . Penicillins Rash   Family History  Problem Relation Age of Onset  . Hypertension Mother   . Hypertension Sister   . Hypertension Son   . Colon cancer Neg Hx   . Esophageal cancer Neg Hx   . Rectal cancer Neg Hx   . Stomach cancer Neg Hx    BP 134/72  Pulse 76  Ht 5\' 3"  (1.6 m)  Wt 181 lb (82.101 kg)  BMI 32.07 kg/m2  SpO2 98%  LMP 08/23/2006  Review of Systems Denies weight change    Objective:   Physical Exam VITAL SIGNS:  See vs page GENERAL: no distress NECK: multinodular  goiter, including the right nodule, is easily palpable.    Lab Results  Component Value Date   TSH 0.45 05/10/2013      Assessment & Plan:  Multinodular goiter, which is usually hereditary.

## 2013-09-24 NOTE — Patient Instructions (Signed)
Let's recheck the ultrasound.  you will receive a phone call, about a day and time for an appointment.   Please return in 1 year.   most of the time, a "lumpy thyroid" will eventually become overactive.  this is usually a slow process, happening over the span of many years.    

## 2013-10-01 ENCOUNTER — Ambulatory Visit
Admission: RE | Admit: 2013-10-01 | Discharge: 2013-10-01 | Disposition: A | Discharge status: Home / Self Care | Payer: PRIVATE HEALTH INSURANCE | Source: Ambulatory Visit | Attending: Endocrinology | Admitting: Endocrinology

## 2013-12-09 ENCOUNTER — Other Ambulatory Visit: Payer: Self-pay | Admitting: Internal Medicine

## 2013-12-24 ENCOUNTER — Other Ambulatory Visit (HOSPITAL_COMMUNITY)
Admission: RE | Admit: 2013-12-24 | Discharge: 2013-12-24 | Disposition: A | Discharge status: Home / Self Care | Payer: PRIVATE HEALTH INSURANCE | Source: Ambulatory Visit | Attending: Gynecology | Admitting: Gynecology

## 2013-12-24 ENCOUNTER — Encounter: Payer: Self-pay | Admitting: Women's Health

## 2013-12-24 ENCOUNTER — Ambulatory Visit (INDEPENDENT_AMBULATORY_CARE_PROVIDER_SITE_OTHER): Payer: PRIVATE HEALTH INSURANCE | Admitting: Women's Health

## 2013-12-24 VITALS — BP 150/90 | Ht 62.5 in | Wt 174.0 lb

## 2013-12-24 DIAGNOSIS — M8580 Other specified disorders of bone density and structure, unspecified site: Secondary | ICD-10-CM

## 2013-12-24 DIAGNOSIS — Z01419 Encounter for gynecological examination (general) (routine) without abnormal findings: Secondary | ICD-10-CM | POA: Insufficient documentation

## 2013-12-24 DIAGNOSIS — M949 Disorder of cartilage, unspecified: Secondary | ICD-10-CM

## 2013-12-24 DIAGNOSIS — M899 Disorder of bone, unspecified: Secondary | ICD-10-CM

## 2013-12-24 NOTE — Progress Notes (Signed)
Cynthia Berger 1956/07/08 [id number]    History:    The patient presents for annual exam.  TAH with BSO 05/2013. Persistent LGSIL/CIN-1/HR HPV. Thyroid nodule with a negative biopsy this past year. Renal cancer 2005. Primary care manages hypertension/labs/kidney function. Normal mammogram history. Negative colonoscopy 2008.   Past medical history, past surgical history, family history and social history were all reviewed and documented in the EPIC chart. Works [employer]. 4 children all doing well. Widowed, remarried this past year.   ROS:  A  ROS was performed and pertinent positives and negatives are included in the history.  Exam:  Filed Vitals:   12/24/13 1406  BP: 150/90    General appearance:  Normal Head/Neck:  Normal, without cervical or supraclavicular adenopathy. Thyroid:  Symmetrical, normal in size, without palpable masses or nodularity. Respiratory  Effort:  Normal  Auscultation:  Clear without wheezing or rhonchi Cardiovascular  Auscultation:  Regular rate, without rubs, murmurs or gallops  Edema/varicosities:  Not grossly evident Abdominal  Soft,nontender, without masses, guarding or rebound.  Liver/spleen:  No organomegaly noted  Hernia:  None appreciated  Skin  Inspection:  Grossly normal  Palpation:  Grossly normal Neurologic/psychiatric  Orientation:  Normal with appropriate conversation.  Mood/affect:  Normal  Genitourinary    Breasts: Examined lying and sitting.     Right: Without masses, retractions, discharge or axillary adenopathy.     Left: Without masses, retractions, discharge or axillary adenopathy.   Inguinal/mons:  Normal without inguinal adenopathy  External genitalia:  Normal  BUS/Urethra/Skene's glands:  Normal  Bladder:  Normal  Vagina:  Normal  Cervix:  Absent  Uterus:  Absent  Adnexa/parametria:     Rt: Without masses or tenderness.   Lt: Without masses or tenderness.  Anus and perineum: Normal  Digital rectal exam: Normal  sphincter tone without palpated masses or tenderness  Assessment/Plan:  58 y.o. MBF G6P4 for annual exam.    TAH with BSO 05/2013 for persistent CIN-1 with positive HR HPV 2005 Renal cancer/nephrectomy Benign thyroid nodule Hypertension-primary care labs and meds  And: SBE's, continue annual mammogram, calcium rich diet, vitamin D 2000 daily. Schedule DEXA. Reviewed importance of regular exercise, home safety and fall prevention discussed. Followup with primary care for leg and hip discomfort. Pap.   Huel Cote [facility], 5:57 PM 12/24/2013

## 2013-12-24 NOTE — Patient Instructions (Signed)
Health Recommendations for Postmenopausal Women Respected and ongoing research has looked at the most common causes of death, disability, and poor quality of life in postmenopausal women. The causes include heart disease, diseases of blood vessels, diabetes, depression, cancer, and bone loss (osteoporosis). Many things can be done to help lower the chances of developing these and other common problems: CARDIOVASCULAR DISEASE Heart Disease: A heart attack is a medical emergency. Know the signs and symptoms of a heart attack. Below are things women can do to reduce their risk for heart disease.   Do not smoke. If you smoke, quit.  Aim for a healthy weight. Being overweight causes many preventable deaths. Eat a healthy and balanced diet and drink an adequate amount of liquids.  Get moving. Make a commitment to be more physically active. Aim for 30 minutes of activity on most, if not all days of the week.  Eat for heart health. Choose a diet that is low in saturated fat and cholesterol and eliminate trans fat. Include whole grains, vegetables, and fruits. Read and understand the labels on food containers before buying.  Know your numbers. Ask your caregiver to check your blood pressure, cholesterol (total, HDL, LDL, triglycerides) and blood glucose. Work with your caregiver on improving your entire clinical picture.  High blood pressure. Limit or stop your table salt intake (try salt substitute and food seasonings). Avoid salty foods and drinks. Read labels on food containers before buying. Eating well and exercising can help control high blood pressure. STROKE  Stroke is a medical emergency. Stroke may be the result of a blood clot in a blood vessel in the brain or by a brain hemorrhage (bleeding). Know the signs and symptoms of a stroke. To lower the risk of developing a stroke:  Avoid fatty foods.  Quit smoking.  Control your diabetes, blood pressure, and irregular heart rate. THROMBOPHLEBITIS  (BLOOD CLOT) OF THE LEG  Becoming overweight and leading a stationary lifestyle may also contribute to developing blood clots. Controlling your diet and exercising will help lower the risk of developing blood clots. CANCER SCREENING  Breast Cancer: Take steps to reduce your risk of breast cancer.  You should practice "breast self-awareness." This means understanding the normal appearance and feel of your breasts and should include breast self-examination. Any changes detected, no matter how small, should be reported to your caregiver.  After age 40, you should have a clinical breast exam (CBE) every year.  Starting at age 40, you should consider having a mammogram (breast X-[place]) every year.  If you have a family history of breast cancer, talk to your caregiver about genetic screening.  If you are at high risk for breast cancer, talk to your caregiver about having an MRI and a mammogram every year.  Intestinal or Stomach Cancer: Tests to consider are a rectal exam, fecal occult blood, sigmoidoscopy, and colonoscopy. Women who are high risk may need to be screened at an earlier age and more often.  Cervical Cancer:  Beginning at age 30, you should have a Pap test every 3 years as long as the past 3 Pap tests have been normal.  If you have had past treatment for cervical cancer or a condition that could lead to cancer, you need Pap tests and screening for cancer for at least 20 years after your treatment.  If you had a hysterectomy for a problem that was not cancer or a condition that could lead to cancer, then you no longer need Pap tests.    If you are between ages 65 and 70, and you have had normal Pap tests going back 10 years, you no longer need Pap tests.  If Pap tests have been discontinued, risk factors (such as a new sexual partner) need to be reassessed to determine if screening should be resumed.  Some medical problems can increase the chance of getting cervical cancer. In these  cases, your caregiver may recommend more frequent screening and Pap tests.  Uterine Cancer: If you have vaginal bleeding after reaching menopause, you should notify your caregiver.  Ovarian cancer: Other than yearly pelvic exams, there are no reliable tests available to screen for ovarian cancer at this time except for yearly pelvic exams.  Lung Cancer: Yearly chest X-rays can detect lung cancer and should be done on high risk women, such as cigarette smokers and women with chronic lung disease (emphysema).  Skin Cancer: A complete body skin exam should be done at your yearly examination. Avoid overexposure to the sun and ultraviolet light lamps. Use a strong sun block cream when in the sun. All of these things are important in lowering the risk of skin cancer. MENOPAUSE Menopause Symptoms: Hormone therapy products are effective for treating symptoms associated with menopause:  Moderate to severe hot flashes.  Night sweats.  Mood swings.  Headaches.  Tiredness.  Loss of sex drive.  Insomnia.  Other symptoms. Hormone replacement carries certain risks, especially in older women. Women who use or are thinking about using estrogen or estrogen with progestin treatments should discuss that with their caregiver. Your caregiver will help you understand the benefits and risks. The ideal dose of hormone replacement therapy is not known. The Food and Drug Administration (FDA) has concluded that hormone therapy should be used only at the lowest doses and for the shortest amount of time to reach treatment goals.  OSTEOPOROSIS Protecting Against Bone Loss and Preventing Fracture: If you use hormone therapy for prevention of bone loss (osteoporosis), the risks for bone loss must outweigh the risk of the therapy. Ask your caregiver about other medications known to be safe and effective for preventing bone loss and fractures. To guard against bone loss or fractures, the following is recommended:  If  you are less than age 50, take 1000 mg of calcium and at least 600 mg of Vitamin D per day.  If you are greater than age 50 but less than age 70, take 1200 mg of calcium and at least 600 mg of Vitamin D per day.  If you are greater than age 70, take 1200 mg of calcium and at least 800 mg of Vitamin D per day. Smoking and excessive alcohol intake increases the risk of osteoporosis. Eat foods rich in calcium and vitamin D and do weight bearing exercises several times a week as your caregiver suggests. DIABETES Diabetes [place]: If you have Type I or Type 2 diabetes, you should keep your blood sugar under control with diet, exercise and recommended medication. Avoid too many sweets, starchy and fatty foods. Being overweight can make control more difficult. COGNITION AND MEMORY Cognition and Memory: Menopausal hormone therapy is not recommended for the prevention of cognitive disorders such as Alzheimer's disease or memory loss.  DEPRESSION  Depression may occur at any age, but is common in elderly women. The reasons may be because of physical, medical, social (loneliness), or financial problems and needs. If you are experiencing depression because of medical problems and control of symptoms, talk to your caregiver about this. Physical activity and   exercise may help with mood and sleep. Community and volunteer involvement may help your sense of value and worth. If you have depression and you feel that the problem is getting worse or becoming severe, talk to your caregiver about treatment options that are best for you. ACCIDENTS  Accidents are common and can be serious in the elderly woman. Prepare your house to prevent accidents. Eliminate throw rugs, place hand bars in the bath, shower and toilet areas. Avoid wearing high heeled shoes or walking on wet, snowy, and icy areas. Limit or stop driving if you have vision or hearing problems, or you feel you are unsteady with you movements and  reflexes. HEPATITIS C Hepatitis C is a type of viral infection affecting the liver. It is spread mainly through contact with blood from an infected person. It can be treated, but if left untreated, it can lead to severe liver damage over years. Many people who are infected do not know that the virus is in their blood. If you are a "baby-boomer", it is recommended that you have one screening test for Hepatitis C. IMMUNIZATIONS  Several immunizations are important to consider having during your senior years, including:   Tetanus, diptheria, and pertussis booster shot.  Influenza every year before the flu season begins.  Pneumonia vaccine.  Shingles vaccine.  Others as indicated based on your specific needs. Talk to your caregiver about these. Document Released: 01/27/2006 Document Revised: 11/21/2012 Document Reviewed: 09/22/2008 Exit[employer] Patient Information 2014 [employer].  

## 2013-12-30 ENCOUNTER — Other Ambulatory Visit: Payer: Self-pay | Admitting: Gynecology

## 2013-12-30 DIAGNOSIS — M8580 Other specified disorders of bone density and structure, unspecified site: Secondary | ICD-10-CM

## 2014-01-06 ENCOUNTER — Telehealth: Payer: Self-pay | Admitting: Internal Medicine

## 2014-01-06 NOTE — Telephone Encounter (Signed)
Left a message for patient to call me back.

## 2014-01-06 NOTE — Telephone Encounter (Signed)
Patient is taking Nexium BID and continues to have acid reflux. She has been watching what she eats, not eating late at night but still having problems. Scheduled patient with Nicoletta Ba, PA on 01/08/14 at 2:30 PM.

## 2014-01-08 ENCOUNTER — Encounter: Payer: Self-pay | Admitting: Physician Assistant

## 2014-01-08 ENCOUNTER — Ambulatory Visit (INDEPENDENT_AMBULATORY_CARE_PROVIDER_SITE_OTHER): Payer: PRIVATE HEALTH INSURANCE | Admitting: Physician Assistant

## 2014-01-08 ENCOUNTER — Ambulatory Visit: Payer: PRIVATE HEALTH INSURANCE | Admitting: Physician Assistant

## 2014-01-08 VITALS — BP 132/90 | HR 64 | Ht 62.0 in | Wt 173.4 lb

## 2014-01-08 DIAGNOSIS — K219 Gastro-esophageal reflux disease without esophagitis: Secondary | ICD-10-CM | POA: Insufficient documentation

## 2014-01-08 NOTE — Patient Instructions (Signed)
Switch to Dexilant 60 mg take 1 tab daily before breakfast. Samples provided. Take this instead of Nexium.  Reflux diet provided. Call us back once you see your Urologist.  We made you an appointment with Dr. Olevia Perches on 02-04-2014 at 2:15 PM.

## 2014-01-08 NOTE — Progress Notes (Signed)
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 I don't thing es. manometry would improve the care .Would treat GERD aggressively.

## 2014-01-08 NOTE — Progress Notes (Signed)
Subjective:    Patient ID: Cynthia Berger, female    DOB: 11-18-1956, 58 y.o.   MRN: [id number]  HPI  Cynthia Berger is a pleasant 58 year old Afro-American female known to Dr. Berdine Addison  who has history of IBS and chronic GERD. She also has history of renal cell cancer for which she underwent a right nephrectomy. Patient had colonoscopy in 2008 which was unremarkable and the EGD was last done in November of 2013. This is a negative exam with the exception of a small hiatal hernia. She was [facility] dilated empirically. Ba Swallow  Was done for  persistent reflux in 2013 and was found to have evidence of GERD with no mucosal irregularity of the esophagus and no stricture she has a nonspecific esophageal dysmotility with three quarters of her swallows disrupted at the level of the aortic arch. Patient comes back in today stating that she does continues to have problems and really has been having problems over the past year or consistently. She's been taking Nexium 40 mg at bedtime but says generally every day she is experiencing reflux symptoms during the daytime hours. She says she's not bothered at night. She has reflux of what she describes as an acidic  gastric contents which  Burns  her throat. She's not having any regurgitation or vomiting. She does have a full feeling or sensation of fullness in her throat most of the time. She feels that her pills sit in her esophagus for a long time- liquids will go down without difficulty -.Sometimes she has to drink liquids or eat something a couple of hours after she has eaten a meal to "push it down". She has not been on any aspirin or NSAIDs. There is no complaints of abdominal painlong time and is sometimes her food since in her esophagus as well. She says generally  She is worried about kidney cancer or something else being run in her abdomen because she says this is the way she felt before her kidney cancer was found. She has an appointment with her urologist  Dr. Jaclyn Prime in [facility] next week. .  Review of Systems  Constitutional: Positive for appetite change.  HENT: Positive for sore throat and trouble swallowing.   Eyes: Negative.   Respiratory: Negative.   Cardiovascular: Negative.   Gastrointestinal: Negative.   Endocrine: Negative.   Genitourinary: Negative.   Musculoskeletal: Negative.   Allergic/Immunologic: Negative.   Hematological: Negative.   Psychiatric/Behavioral: Negative.    Outpatient Prescriptions Prior to Visit  Medication Sig Dispense Refill  . amLODipine (NORVASC) 2.5 MG tablet TAKE ONE TABLET BY MOUTH ONCE DAILY  90 tablet  0  . esomeprazole (NEXIUM) 40 MG capsule Take 40 mg by mouth daily before breakfast.      . Multiple Vitamins-Minerals (MULTIVITAMIN PO) Take 1 tablet by mouth daily.       . nebivolol (BYSTOLIC) 5 MG tablet Take 2.5 mg by mouth daily.       No facility-administered medications prior to visit.       Allergies  Allergen Reactions  . Penicillins Rash   Patient Active Problem List   Diagnosis Date Noted  . GERD (gastroesophageal reflux disease) 01/08/2014  . Preoperative evaluation to rule out surgical contraindication 05/10/2013  . Cardiomyopathy 05/16/2012  . Neck pain on right side 04/20/2012  . Multinodular goiter 04/18/2012  . 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 Palpitations 03/07/2012  . Abdominal bruit 03/07/2012  . Hypertension 02/23/2012  . History of kidney cancer   .  AC (acromioclavicular) joint bone spurs   . Death of family member   . Vitamin D deficiency 11/19/2007   History  Substance Use Topics  . Smoking status: Never Smoker   . Smokeless tobacco: Never Used  . Alcohol Use: Yes     Comment: occasional   family history includes Hypertension in her mother, sister, and son. There is no history of Colon cancer, Esophageal cancer, Rectal cancer, or Stomach cancer.  Objective:   Physical Exam  well-developed African American female in no acute distress, pleasant blood  pressure 132/90 pulse 64 height 5 foot 2 weight 173. HEENT; nontraumatic normocephalic EOMI PERRLA sclera anicteric, Supple ;no JVD, Cardiovascular or ;regular rate and rhythm with S1-S2 no murmur or gallop, Pulmonary; clear bilaterally, Abdomen; soft she has a large right upper quadrant incisional scar no palpable mass or hepatosplenomegaly bowel sounds are present she is nontender, Rectal; exam not done, Extremities; no clubbing cyanosis or edema skin warm dry, Psych; mood and affect appropriate      Assessment & Plan:    #84  58 year old female with chronic GERD and esophageal dysmotility who comes in with complaints of persistent acid reflux, esophageal fullness and dysphagia. I believe most of her symptoms are due to esophageal dysmotility though this seems to be a new idea to her. #2 history of renal cell CA status post right nephrectomy #3 hypertension #4 colon neoplasia screening-negative colonoscopy 2008  Plan Long discussion regarding esophageal dysmotility. Advise patient sit upright for all meals and one hour after each meal Switch to trial of Exelon 60 mg by mouth every morning rather than Nexium at bedtime. Samples were given Discussed esophageal manometry and possible repeat EGD. Patient would like to try switching medications first. Also offered to followup CT scan of the abdomen and pelvis. She will discuss with her urologist next week. Followup office visit with Dr. Olevia Perches in 3-4 weeks

## 2014-01-22 ENCOUNTER — Telehealth: Payer: Self-pay | Admitting: Internal Medicine

## 2014-01-23 ENCOUNTER — Telehealth: Payer: Self-pay | Admitting: *Deleted

## 2014-01-23 MED ORDER — DEXLANSOPRAZOLE 60 MG PO CPDR: 60.0000 mg | DELAYED_RELEASE_CAPSULE | Freq: Every day | ORAL | Status: DC

## 2014-01-23 NOTE — Telephone Encounter (Signed)
Spoke to Pharmacist and advised I did a Prior authorization for the Dexilant 60 mg.  The pharmacist said it was approved but will cost her $55.00 for 30 tablets.  Not sure if the patient will want to pay that.

## 2014-01-23 NOTE — Telephone Encounter (Signed)
Called patient and left her a message that [facility] did approve the Dexilant 60 mg for 3 years.  I did a prior authorization.  I told her she will get a letter and possible a call as well.  I advised I called [facility] to let them know also. I sent the informed fax regarding this from [place] to be scanned. Auth # [id number].

## 2014-01-23 NOTE — Telephone Encounter (Signed)
Refill sent to [place] for Dexilant 60 mg.

## 2014-01-24 NOTE — Telephone Encounter (Signed)
See phone note from 01-23-2014.

## 2014-02-04 ENCOUNTER — Ambulatory Visit: Payer: PRIVATE HEALTH INSURANCE | Admitting: Internal Medicine

## 2014-02-10 ENCOUNTER — Ambulatory Visit (INDEPENDENT_AMBULATORY_CARE_PROVIDER_SITE_OTHER): Payer: PRIVATE HEALTH INSURANCE

## 2014-02-10 ENCOUNTER — Other Ambulatory Visit: Payer: Self-pay | Admitting: Gynecology

## 2014-02-10 DIAGNOSIS — Z13820 Encounter for screening for osteoporosis: Secondary | ICD-10-CM

## 2014-02-10 DIAGNOSIS — M8580 Other specified disorders of bone density and structure, unspecified site: Secondary | ICD-10-CM

## 2014-02-14 ENCOUNTER — Other Ambulatory Visit: Payer: Self-pay | Admitting: *Deleted

## 2014-02-14 DIAGNOSIS — M898X9 Other specified disorders of bone, unspecified site: Secondary | ICD-10-CM

## 2014-02-14 DIAGNOSIS — M8580 Other specified disorders of bone density and structure, unspecified site: Secondary | ICD-10-CM

## 2014-02-17 ENCOUNTER — Other Ambulatory Visit: Payer: PRIVATE HEALTH INSURANCE

## 2014-02-17 DIAGNOSIS — M898X9 Other specified disorders of bone, unspecified site: Secondary | ICD-10-CM

## 2014-02-17 DIAGNOSIS — M8580 Other specified disorders of bone density and structure, unspecified site: Secondary | ICD-10-CM

## 2014-02-18 ENCOUNTER — Other Ambulatory Visit: Payer: Self-pay | Admitting: Gynecology

## 2014-02-18 DIAGNOSIS — E559 Vitamin D deficiency, unspecified: Secondary | ICD-10-CM

## 2014-02-18 LAB — VITAMIN D 25 HYDROXY (VIT D DEFICIENCY, FRACTURES): VIT D 25 HYDROXY: 27 ng/mL — AB (ref 30–89)

## 2014-03-11 ENCOUNTER — Ambulatory Visit: Payer: PRIVATE HEALTH INSURANCE | Admitting: Internal Medicine

## 2014-03-25 ENCOUNTER — Telehealth: Payer: Self-pay | Admitting: Internal Medicine

## 2014-03-25 NOTE — Telephone Encounter (Signed)
Message copied by Oliva Bustard on Tue Mar 25, 2014 11:04 AM ------      Message from: Larina Bras      Created: Tue Mar 11, 2014  4:57 PM                   ----- Message -----         From: Lafayette Dragon, MD         Sent: 03/11/2014   4:55 PM           To: Larina Bras, CMA            Yes, charge no show.      ----- Message -----         From: Larina Bras, CMA         Sent: 03/11/2014   4:13 PM           To: Lafayette Dragon, MD            Patient no showed appointment with Dr Olevia Perches on 03/11/14. Dr Olevia Perches, do you want to charge no show fee?       ------

## 2014-04-01 ENCOUNTER — Other Ambulatory Visit: Payer: Self-pay | Admitting: Internal Medicine

## 2014-04-30 ENCOUNTER — Ambulatory Visit (INDEPENDENT_AMBULATORY_CARE_PROVIDER_SITE_OTHER): Payer: PRIVATE HEALTH INSURANCE | Admitting: Women's Health

## 2014-04-30 ENCOUNTER — Encounter: Payer: Self-pay | Admitting: Women's Health

## 2014-04-30 DIAGNOSIS — L738 Other specified follicular disorders: Secondary | ICD-10-CM

## 2014-04-30 DIAGNOSIS — L678 Other hair color and hair shaft abnormalities: Secondary | ICD-10-CM

## 2014-04-30 DIAGNOSIS — L739 Follicular disorder, unspecified: Secondary | ICD-10-CM

## 2014-04-30 NOTE — Progress Notes (Signed)
Patient ID: Cynthia Berger, female   DOB: 09-25-1956, 58 y.o.   MRN: [id number] Presents with complaint of questionable infected hair follicle. States noticed after shaving, was larger painful "boil", drained white drainage.  Applied antibiotic ointment on it and is now better but wanted to have it checked. Denies vaginal discharge, urinary symptoms, fever, abdominal pain. TAH with BSO 2014 on no HRT.  Exam: Appears well. On right labia majora healing 1 cm nonindurated folliculitis.  Healing folliculitis  Plan: Continue antibiotic ointment twice daily, loose clothing, call if continued problems.

## 2014-05-07 ENCOUNTER — Telehealth: Payer: Self-pay | Admitting: Internal Medicine

## 2014-05-07 MED ORDER — NEBIVOLOL HCL 5 MG PO TABS: 2.5000 mg | ORAL_TABLET | Freq: Every day | ORAL | Status: DC

## 2014-05-07 MED ORDER — AMLODIPINE BESYLATE 2.5 MG PO TABS: ORAL_TABLET | ORAL | Status: DC

## 2014-05-07 NOTE — Telephone Encounter (Signed)
Prt request amLODipine (NORVASC) 2.5 MG tablet And  nebivolol (BYSTOLIC) 5 MG tablet [facility] [address] Pt has made her needed appt  for 6/17

## 2014-05-07 NOTE — Addendum Note (Signed)
Addended by: Townsend Roger D on: 05/07/2014 11:23 AM   Modules accepted: Orders

## 2014-05-07 NOTE — Telephone Encounter (Signed)
rx sent in electronically for 1 month supply

## 2014-05-08 ENCOUNTER — Other Ambulatory Visit: Payer: Self-pay | Admitting: Gynecology

## 2014-05-08 DIAGNOSIS — Z1231 Encounter for screening mammogram for malignant neoplasm of breast: Secondary | ICD-10-CM

## 2014-05-15 ENCOUNTER — Ambulatory Visit (HOSPITAL_COMMUNITY): Payer: PRIVATE HEALTH INSURANCE | Attending: Gynecology

## 2014-05-27 ENCOUNTER — Ambulatory Visit (HOSPITAL_COMMUNITY)
Admission: RE | Admit: 2014-05-27 | Discharge: 2014-05-27 | Disposition: A | Discharge status: Home / Self Care | Payer: PRIVATE HEALTH INSURANCE | Source: Ambulatory Visit | Attending: Gynecology | Admitting: Gynecology

## 2014-05-27 DIAGNOSIS — Z1231 Encounter for screening mammogram for malignant neoplasm of breast: Secondary | ICD-10-CM | POA: Insufficient documentation

## 2014-06-04 ENCOUNTER — Ambulatory Visit: Payer: PRIVATE HEALTH INSURANCE | Admitting: Internal Medicine

## 2014-06-09 ENCOUNTER — Ambulatory Visit (INDEPENDENT_AMBULATORY_CARE_PROVIDER_SITE_OTHER): Payer: PRIVATE HEALTH INSURANCE | Admitting: Internal Medicine

## 2014-06-09 ENCOUNTER — Encounter: Payer: Self-pay | Admitting: Internal Medicine

## 2014-06-09 VITALS — BP 132/92 | HR 60 | Temp 98.6°F | Ht 62.0 in | Wt 175.0 lb

## 2014-06-09 DIAGNOSIS — Q600 Renal agenesis, unilateral: Secondary | ICD-10-CM

## 2014-06-09 DIAGNOSIS — Z23 Encounter for immunization: Secondary | ICD-10-CM

## 2014-06-09 DIAGNOSIS — Q602 Renal agenesis, unspecified: Secondary | ICD-10-CM

## 2014-06-09 DIAGNOSIS — I10 Essential (primary) hypertension: Secondary | ICD-10-CM

## 2014-06-09 DIAGNOSIS — E042 Nontoxic multinodular goiter: Secondary | ICD-10-CM

## 2014-06-09 DIAGNOSIS — Q605 Renal hypoplasia, unspecified: Secondary | ICD-10-CM

## 2014-06-09 DIAGNOSIS — IMO0002 Reserved for concepts with insufficient information to code with codable children: Secondary | ICD-10-CM

## 2014-06-09 MED ORDER — NEBIVOLOL HCL 5 MG PO TABS: 2.5000 mg | ORAL_TABLET | Freq: Every day | ORAL | Status: DC

## 2014-06-09 MED ORDER — AMLODIPINE BESYLATE 2.5 MG PO TABS: ORAL_TABLET | ORAL | Status: DC

## 2014-06-09 NOTE — Assessment & Plan Note (Addendum)
Monitor electrolytes and kidney function. Lab Results  Component Value Date   CREATININE 1.30* 05/16/2013    .

## 2014-06-09 NOTE — Assessment & Plan Note (Signed)
Followed by Dr. Loanne Drilling.  Thyroid ultrasound in October of 2014 showed stable multinodular goiter.  Monitor TFTs

## 2014-06-09 NOTE — Progress Notes (Signed)
Pre visit review using our clinic review tool, if applicable. No additional management support is needed unless otherwise documented below in the visit note. 

## 2014-06-09 NOTE — Progress Notes (Signed)
Subjective:    Patient ID: Cynthia Berger, female    DOB: 04-Jan-1956, 58 y.o.   MRN: [id number]  HPI  58 year old [place] American female with history of right kidney cancer (status post nephrectomy), hypertension and multinodular goiter for routine followup. Interval medical history-patient underwent total abdominal hysterectomy In June of 2014. Patient is followed by her gynecologist. She reports she still has intermittent numbness sensation in the pelvic area.  Multinodular goiter-followed by endocrinologist Dr. Loanne Drilling. Her thyroid ultrasound in October of 2014 showed stable dominant right nodule.  Hypertension-patient reports she monitors her blood pressure at home. She currently takes amlodipine 2.5 mg and Bystolic 2.5 mg. Her systolic blood pressure readings usually between 120 to130. She reports diastolic blood pressures usually between 70 and 80.   Review of Systems Negative for chest pain, negative for shortness of breath.  Weight is stable.    Past Medical History  Diagnosis Date  . LGSIL (low grade squamous intraepithelial dysplasia) 2013     C&B Inadequate ECC--positive LGSIL --  . Vitamin D deficiency 11/2007    LOW AT 18  . AC (acromioclavicular) joint bone spurs     HEEL BONE SPURS-SURGERY 12/2010 LEFT FOOT  . Antral gastritis   . Positive H. pylori test   . Uterine fibroid   . GERD (gastroesophageal reflux disease)   . Hypertension   . H/O hiatal hernia   . PMB (postmenopausal bleeding)   . Heart palpitations   . DDD (degenerative disc disease), cervical   . History of kidney cancer 2005    S/P RIGHT NEPHRECTOMY   . Multinodular goiter     RIGHT LOBE-- PER BX NEGATIVE  . Hiatal hernia   . Esophageal dysmotility     History   Social History  . Marital Status: Widowed    Spouse Name: N/A    Number of Children: 4  . Years of Education: N/A   Occupational History  . [employer]    Social History Main Topics  . Smoking status: Never Smoker   . Smokeless  tobacco: Never Used  . Alcohol Use: Yes     Comment: occasional  . Drug Use: No  . Sexual Activity: Yes    Partners: Male    Birth Control/ Protection: Post-menopausal   Other Topics Concern  . Not on file   Social History Narrative  . No narrative on file    Past Surgical History  Procedure Laterality Date  . Cesarean section  1993    W/ TUBAL LIGATION, BILATERAL  . Nephrectomy  2005    RIGHT KIDNEY CANCER  . Rotator cuff repair  2010    RIGHT  . Foot surgery  2012    left heel spur removed  . Transthoracic echocardiogram  04-10-2012  DR BRIEN CRENSHAW    LVSF MILDLY REDUCED/ EF 45-50%  . Hysteroscopy w/d&c  06/08/2012    Procedure: DILATATION AND CURETTAGE /HYSTEROSCOPY;  Surgeon: Anastasio Auerbach, MD;  Location: [facility];  Service: Gynecology;  Laterality: N/A;  WITH RESECTION OF MYOMA  . Cervical biopsy  w/ loop electrode excision  10/2012    for persistent low-grade dysplasia  . Tubal ligation    . Esophogeal dilatation    . Salpingoophorectomy Bilateral 05/28/2013    Procedure: BILATERAL SALPINGO OOPHORECTOMY;  Surgeon: Anastasio Auerbach, MD;  Location: [facility];  Service: Gynecology;  Laterality: Bilateral;  . Abdominal hysterectomy N/A 05/28/2013    Procedure: HYSTERECTOMY ABDOMINAL;  Surgeon: Anastasio Auerbach, MD;  Location: [facility];  Service: Gynecology;  Laterality: N/A;  . Cystoscopy N/A 05/28/2013    Procedure: CYSTOSCOPY;  Surgeon: Anastasio Auerbach, MD;  Location: [facility];  Service: Gynecology;  Laterality: N/A;  . Biopsy thyroid Right 2014    ENLARGED THYROID NO MEDS BENIGN  . S/p right nephrectomy Right     Family History  Problem Relation Age of Onset  . Hypertension Mother   . Hypertension Sister   . Hypertension Son   . Colon cancer Neg Hx   . Esophageal cancer Neg Hx   . Rectal cancer Neg Hx   . Stomach cancer Neg Hx     Allergies  Allergen Reactions  . Penicillins Rash    Current Outpatient Prescriptions on File Prior  to Visit  Medication Sig Dispense Refill  . dexlansoprazole (DEXILANT) 60 MG capsule Take 1 capsule (60 mg total) by mouth daily.  90 capsule  3  . esomeprazole (NEXIUM) 40 MG capsule Take 40 mg by mouth daily before breakfast.      . Multiple Vitamins-Minerals (MULTIVITAMIN PO) Take 1 tablet by mouth daily.       . [DISCONTINUED] dicyclomine (BENTYL) 20 MG tablet Take 1 tablet (20 mg total) by mouth 2 (two) times daily.  40 tablet  0   No current facility-administered medications on file prior to visit.    BP 132/92  Pulse 60  Temp(Src) 98.6 F (37 C) (Oral)  Ht 5\' 2"  (1.575 m)  Wt 175 lb (79.379 kg)  BMI 32.00 kg/m2  LMP 08/23/2006     Objective:   Physical Exam  Constitutional: She is oriented to person, place, and time. She appears well-developed and well-nourished. No distress.  HENT:  Head: Normocephalic and atraumatic.  Eyes: EOM are normal. Pupils are equal, round, and reactive to light.  Neck: Neck supple.  Bilateral thyroid nodules R>L  Cardiovascular: Normal rate, regular rhythm and normal heart sounds.   No murmur heard. Pulmonary/Chest: Effort normal and breath sounds normal. She has no wheezes.  Musculoskeletal: She exhibits no edema.  Neurological: She is alert and oriented to person, place, and time. No cranial nerve deficit.  Skin: Skin is warm and dry.  Psychiatric: She has a normal mood and affect. Her behavior is normal.          Assessment & Plan:

## 2014-06-09 NOTE — Assessment & Plan Note (Signed)
BP stable.  She also reports her home BP readings are normal. BP: 132/92 mmHg

## 2014-06-10 ENCOUNTER — Telehealth: Payer: Self-pay | Admitting: Internal Medicine

## 2014-06-10 LAB — BASIC METABOLIC PANEL
BUN: 19 mg/dL (ref 6–23)
CALCIUM: 9.7 mg/dL (ref 8.4–10.5)
CHLORIDE: 103 meq/L (ref 96–112)
CO2: 28 meq/L (ref 19–32)
CREATININE: 1.3 mg/dL — AB (ref 0.4–1.2)
GFR: 52.87 mL/min — ABNORMAL LOW (ref >60.00)
GLUCOSE: 87 mg/dL (ref 70–99)
Potassium: 5.1 mEq/L (ref 3.5–5.1)
Sodium: 138 mEq/L (ref 135–145)

## 2014-06-10 LAB — CBC WITH DIFFERENTIAL/PLATELET
BASOS ABS: 0.1 10*3/uL (ref 0.0–0.1)
Basophils Relative: 0.9 % (ref 0.0–3.0)
EOS ABS: 0.2 10*3/uL (ref 0.0–0.7)
Eosinophils Relative: 2.8 % (ref 0.0–5.0)
HCT: 40.3 % (ref 36.0–46.0)
Hemoglobin: 13.3 g/dL (ref 12.0–15.0)
LYMPHS PCT: 30.8 % (ref 12.0–46.0)
Lymphs Abs: 2.5 10*3/uL (ref 0.7–4.0)
MCHC: 32.8 g/dL (ref 30.0–36.0)
MCV: 89.3 fl (ref 78.0–100.0)
MONO ABS: 0.8 10*3/uL (ref 0.1–1.0)
Monocytes Relative: 9.2 % (ref 3.0–12.0)
NEUTROS PCT: 56.3 % (ref 43.0–77.0)
Neutro Abs: 4.6 10*3/uL (ref 1.4–7.7)
PLATELETS: 252 10*3/uL (ref 150.0–400.0)
RBC: 4.52 Mil/uL (ref 3.87–5.11)
RDW: 13.6 % (ref 11.5–15.5)
WBC: 8.1 10*3/uL (ref 4.0–10.5)

## 2014-06-10 LAB — LIPID PANEL
CHOL/HDL RATIO: 3
Cholesterol: 188 mg/dL (ref 0–200)
HDL: 64.5 mg/dL (ref >39.00)
LDL Cholesterol: 107 mg/dL — ABNORMAL HIGH (ref 0–99)
NONHDL: 123.5
TRIGLYCERIDES: 84 mg/dL (ref 0.0–149.0)
VLDL: 16.8 mg/dL (ref 0.0–40.0)

## 2014-06-10 LAB — HEPATIC FUNCTION PANEL
ALK PHOS: 81 U/L (ref 39–117)
ALT: 16 U/L (ref 0–35)
AST: 24 U/L (ref 0–37)
Albumin: 4.1 g/dL (ref 3.5–5.2)
BILIRUBIN DIRECT: 0 mg/dL (ref 0.0–0.3)
TOTAL PROTEIN: 8 g/dL (ref 6.0–8.3)
Total Bilirubin: 0.4 mg/dL (ref 0.2–1.2)

## 2014-06-10 LAB — TSH: TSH: 0.91 u[IU]/mL (ref 0.35–4.50)

## 2014-06-10 LAB — T4, FREE: Free T4: 0.82 ng/dL (ref 0.60–1.60)

## 2014-06-10 NOTE — Telephone Encounter (Signed)
Relevant patient education assigned to patient using Emmi. ° °

## 2014-07-14 ENCOUNTER — Ambulatory Visit: Payer: PRIVATE HEALTH INSURANCE | Admitting: Internal Medicine

## 2014-08-06 ENCOUNTER — Other Ambulatory Visit: Payer: PRIVATE HEALTH INSURANCE

## 2014-08-06 DIAGNOSIS — E559 Vitamin D deficiency, unspecified: Secondary | ICD-10-CM

## 2014-08-07 LAB — VITAMIN D 25 HYDROXY (VIT D DEFICIENCY, FRACTURES): VIT D 25 HYDROXY: 30 ng/mL (ref 30–89)

## 2014-10-02 ENCOUNTER — Emergency Department (HOSPITAL_BASED_OUTPATIENT_CLINIC_OR_DEPARTMENT_OTHER)
Admission: EM | Admit: 2014-10-02 | Discharge: 2014-10-02 | Disposition: A | Discharge status: Home / Self Care | Payer: PRIVATE HEALTH INSURANCE | Attending: Emergency Medicine | Admitting: Emergency Medicine

## 2014-10-02 ENCOUNTER — Emergency Department (HOSPITAL_BASED_OUTPATIENT_CLINIC_OR_DEPARTMENT_OTHER): Payer: PRIVATE HEALTH INSURANCE

## 2014-10-02 ENCOUNTER — Encounter (HOSPITAL_BASED_OUTPATIENT_CLINIC_OR_DEPARTMENT_OTHER): Payer: Self-pay | Admitting: Emergency Medicine

## 2014-10-02 DIAGNOSIS — M940 Chondrocostal junction syndrome [Tietze]: Secondary | ICD-10-CM | POA: Insufficient documentation

## 2014-10-02 DIAGNOSIS — Z8639 Personal history of other endocrine, nutritional and metabolic disease: Secondary | ICD-10-CM | POA: Insufficient documentation

## 2014-10-02 DIAGNOSIS — Z8619 Personal history of other infectious and parasitic diseases: Secondary | ICD-10-CM | POA: Insufficient documentation

## 2014-10-02 DIAGNOSIS — I10 Essential (primary) hypertension: Secondary | ICD-10-CM | POA: Insufficient documentation

## 2014-10-02 DIAGNOSIS — K219 Gastro-esophageal reflux disease without esophagitis: Secondary | ICD-10-CM | POA: Insufficient documentation

## 2014-10-02 DIAGNOSIS — Z79899 Other long term (current) drug therapy: Secondary | ICD-10-CM | POA: Insufficient documentation

## 2014-10-02 DIAGNOSIS — Z880 Allergy status to penicillin: Secondary | ICD-10-CM | POA: Insufficient documentation

## 2014-10-02 DIAGNOSIS — Z85528 Personal history of other malignant neoplasm of kidney: Secondary | ICD-10-CM | POA: Insufficient documentation

## 2014-10-02 DIAGNOSIS — Z8742 Personal history of other diseases of the female genital tract: Secondary | ICD-10-CM | POA: Insufficient documentation

## 2014-10-02 NOTE — ED Notes (Signed)
Left sided chest wall pain x1 week.  Sts she moved a piece of furniture a week ago.  Nontender to touch but worse with movement of left arm.

## 2014-10-02 NOTE — Discharge Instructions (Signed)
Chest Wall Pain °Chest wall pain is pain felt in or around the chest bones and muscles. It may take up to 6 weeks to get better. It may take longer if you are active. Chest wall pain can happen on its own. Other times, things like germs, injury, coughing, or exercise can cause the pain. °HOME CARE  °· Avoid activities that make you tired or cause pain. Try not to use your chest, belly (abdominal), or side muscles. Do not use heavy weights. °· Put ice on the sore area. °¨ Put ice in a plastic bag. °¨ Place a towel between your skin and the bag. °¨ Leave the ice on for 15-20 minutes for the first 2 days. °· Only take medicine as told by your doctor. °GET HELP RIGHT AWAY IF:  °· You have more pain or are very uncomfortable. °· You have a fever. °· Your chest pain gets worse. °· You have new problems. °· You feel sick to your stomach (nauseous) or throw up (vomit). °· You start to sweat or feel lightheaded. °· You have a cough with mucus (phlegm). °· You cough up blood. °MAKE SURE YOU:  °· Understand these instructions. °· Will watch your condition. °· Will get help right away if you are not doing well or get worse. °Document Released: 05/23/2008 Document Revised: 02/27/2012 Document Reviewed: 08/01/2011 °ExitCare® Patient Information ©2015 [employer]. This information is not intended to replace advice given to you by your health care provider. Make sure you discuss any questions you have with your health care provider. ° °

## 2014-10-02 NOTE — ED Provider Notes (Signed)
CSN: [id number]     Arrival date & time 10/02/14  1953 History  This chart was scribed for Dot Lanes, MD by Delphia Grates, ED Scribe. This patient was seen in room MH11/MH11 and the patient's care was started at 9:39 PM.     Chief Complaint  Patient presents with  . Chest Wall Pain     The history is provided by the patient. No language interpreter was used.    HPI Comments: Cynthia Berger is a 58 y.o. female who presents to the Emergency Department complaining of intermittent, sharp, left sided chest pain that began approximately 5 days ago. Patient believe the pain started after she lifted a small table. She states the pain is exacerbated with movement and relieved at rest. She reports no pain with deep breathing. Patient has tried hot showers with temporary improvement. She has history of GERD and suspects this may be related. She is currently on medication for GERD and HTN, and denies history of CAD or MI. Patient has history of kidney cancer (2 years ago) and has past surgical history of right nephrectomy as a result.   Past Medical History  Diagnosis Date  . LGSIL (low grade squamous intraepithelial dysplasia) 2013     C&B Inadequate ECC--positive LGSIL --  . Vitamin D deficiency 11/2007    LOW AT 18  . AC (acromioclavicular) joint bone spurs     HEEL BONE SPURS-SURGERY 12/2010 LEFT FOOT  . Antral gastritis   . Positive H. pylori test   . Uterine fibroid   . GERD (gastroesophageal reflux disease)   . Hypertension   . H/O hiatal hernia   . PMB (postmenopausal bleeding)   . Heart palpitations   . DDD (degenerative disc disease), cervical   . History of kidney cancer 2005    S/P RIGHT NEPHRECTOMY   . Multinodular goiter     RIGHT LOBE-- PER BX NEGATIVE  . Hiatal hernia   . Esophageal dysmotility    Past Surgical History  Procedure Laterality Date  . Cesarean section  1993    W/ TUBAL LIGATION, BILATERAL  . Nephrectomy  2005    RIGHT KIDNEY CANCER  . Rotator  cuff repair  2010    RIGHT  . Foot surgery  2012    left heel spur removed  . Transthoracic echocardiogram  04-10-2012  DR BRIEN CRENSHAW    LVSF MILDLY REDUCED/ EF 45-50%  . Hysteroscopy w/d&c  06/08/2012    Procedure: DILATATION AND CURETTAGE /HYSTEROSCOPY;  Surgeon: Anastasio Auerbach, MD;  Location: [facility];  Service: Gynecology;  Laterality: N/A;  WITH RESECTION OF MYOMA  . Cervical biopsy  w/ loop electrode excision  10/2012    for persistent low-grade dysplasia  . Tubal ligation    . Esophogeal dilatation    . Salpingoophorectomy Bilateral 05/28/2013    Procedure: BILATERAL SALPINGO OOPHORECTOMY;  Surgeon: Anastasio Auerbach, MD;  Location: [facility];  Service: Gynecology;  Laterality: Bilateral;  . Abdominal hysterectomy N/A 05/28/2013    Procedure: HYSTERECTOMY ABDOMINAL;  Surgeon: Anastasio Auerbach, MD;  Location: [facility];  Service: Gynecology;  Laterality: N/A;  . Cystoscopy N/A 05/28/2013    Procedure: CYSTOSCOPY;  Surgeon: Anastasio Auerbach, MD;  Location: [facility];  Service: Gynecology;  Laterality: N/A;  . Biopsy thyroid Right 2014    ENLARGED THYROID NO MEDS BENIGN  . S/p right nephrectomy Right    Family History  Problem Relation Age of Onset  . Hypertension Mother   .  Hypertension Sister   . Hypertension Son   . Colon cancer Neg Hx   . Esophageal cancer Neg Hx   . Rectal cancer Neg Hx   . Stomach cancer Neg Hx    History  Substance Use Topics  . Smoking status: Never Smoker   . Smokeless tobacco: Never Used  . Alcohol Use: Yes     Comment: occasional   OB History   Grav Para Term Preterm Abortions TAB SAB Ect Mult Living   6 4   2  2   4      Review of Systems  Cardiovascular: Positive for chest pain.  All other systems reviewed and are negative.     Allergies  Penicillins  Home Medications   Prior to Admission medications   Medication Sig Start Date End Date Taking? Authorizing Provider  amLODipine (NORVASC) 2.5 MG tablet TAKE ONE  TABLET BY MOUTH ONCE DAILY 06/09/14   Doe-Hyun R Shawna Orleans, DO  dexlansoprazole (DEXILANT) 60 MG capsule Take 1 capsule (60 mg total) by mouth daily. 01/23/14   Amy S Esterwood, PA-C  esomeprazole (NEXIUM) 40 MG capsule Take 40 mg by mouth daily before breakfast. 05/10/13 06/10/15  Doe-Hyun R Shawna Orleans, DO  Multiple Vitamins-Minerals (MULTIVITAMIN PO) Take 1 tablet by mouth daily.     Historical Provider, MD  nebivolol (BYSTOLIC) 5 MG tablet Take 0.5 tablets (2.5 mg total) by mouth daily. 06/09/14   Doe-Hyun Kyra Searles, DO   Triage Vitals: BP 166/79  Pulse 68  Temp(Src) 98.3 F (36.8 C) (Oral)  Resp 20  Ht 5\' 3"  (1.6 m)  Wt 170 lb (77.111 kg)  BMI 30.12 kg/m2  SpO2 100%  LMP 08/23/2006  Physical Exam  Nursing note and vitals reviewed. Constitutional: She is oriented to person, place, and time. She appears well-developed and well-nourished. No distress.  HENT:  Head: Normocephalic and atraumatic.  Eyes: Pupils are equal, round, and reactive to light.  Neck: Normal range of motion.  Cardiovascular: Normal rate and intact distal pulses.   Pulmonary/Chest: Effort normal and breath sounds normal. No respiratory distress.    Reproducable chest wall pain  Abdominal: Normal appearance. She exhibits no distension.  Musculoskeletal: Normal range of motion.  Neurological: She is alert and oriented to person, place, and time. No cranial nerve deficit.  Skin: Skin is warm and dry. No rash noted.  Psychiatric: She has a normal mood and affect. Her behavior is normal.    ED Course  Procedures (including critical care time)  DIAGNOSTIC STUDIES: Oxygen Saturation is 100% on room air, normal by my interpretation.    COORDINATION OF CARE: At 2145 Discussed treatment plan with patient which includes follow up with nephrologist to discuss ibuprofen. Patient agrees.   Labs Review Labs Reviewed - No data to display  Imaging Review Dg Chest 2 View  10/02/2014   CLINICAL DATA:  Left-sided chest wall pain for 1  week. Initial encounter.  EXAM: CHEST  2 VIEW  COMPARISON:  05/10/2013  FINDINGS: The heart size and mediastinal contours are within normal limits. There is no edema, consolidation, effusion, or pneumothorax. Calcified granuloma at the right base. There is chronic biapical pleural parenchymal scarring. Chronic fragmentation and widening of the right acromioclavicular joint which could be postsurgical or posttraumatic.  IMPRESSION: No active cardiopulmonary disease.   Electronically Signed   By: Jorje Guild M.D.   On: 10/02/2014 21:09     EKG Interpretation   Date/Time:  Thursday October 02 2014 20:04:56 EDT Ventricular Rate:  70  PR Interval:  166 QRS Duration: 80 QT Interval:  432 QTC Calculation: 438 R Axis:   13 Text Interpretation:  Normal sinus rhythm Normal ECG Confirmed by Carleton Vanvalkenburgh   MD, Arbie Reisz ([id number]) on 10/02/2014 9:06:32 PM      MDM   Final diagnoses:  Costochondritis, acute    I personally performed the services described in this documentation, which was scribed in my presence. The recorded information has been reviewed and considered.  Dot Lanes, MD 10/09/14 2127

## 2014-10-20 ENCOUNTER — Encounter (HOSPITAL_BASED_OUTPATIENT_CLINIC_OR_DEPARTMENT_OTHER): Payer: Self-pay | Admitting: Emergency Medicine

## 2014-12-08 ENCOUNTER — Other Ambulatory Visit: Payer: Self-pay | Admitting: Internal Medicine

## 2015-02-06 ENCOUNTER — Ambulatory Visit (INDEPENDENT_AMBULATORY_CARE_PROVIDER_SITE_OTHER): Payer: 59 | Admitting: Women's Health

## 2015-02-06 ENCOUNTER — Ambulatory Visit (INDEPENDENT_AMBULATORY_CARE_PROVIDER_SITE_OTHER): Payer: 59 | Admitting: Endocrinology

## 2015-02-06 ENCOUNTER — Other Ambulatory Visit (HOSPITAL_COMMUNITY)
Admission: RE | Admit: 2015-02-06 | Discharge: 2015-02-06 | Disposition: A | Discharge status: Home / Self Care | Payer: 59 | Source: Ambulatory Visit | Attending: Gynecology | Admitting: Gynecology

## 2015-02-06 ENCOUNTER — Encounter: Payer: Self-pay | Admitting: Women's Health

## 2015-02-06 ENCOUNTER — Encounter: Payer: Self-pay | Admitting: Endocrinology

## 2015-02-06 VITALS — BP 150/80 | Ht 63.0 in | Wt 191.0 lb

## 2015-02-06 VITALS — BP 134/94 | HR 61 | Temp 98.1°F | Ht 63.0 in | Wt 191.0 lb

## 2015-02-06 DIAGNOSIS — Z01419 Encounter for gynecological examination (general) (routine) without abnormal findings: Secondary | ICD-10-CM | POA: Diagnosis present

## 2015-02-06 DIAGNOSIS — E042 Nontoxic multinodular goiter: Secondary | ICD-10-CM

## 2015-02-06 NOTE — Patient Instructions (Addendum)
In my opinion, we can wait another year to recheck the ultrasound.  Please return in 1 year.   Please continue to have the thyroid blood test checked with Dr Shawna Orleans, at least once per year.   most of the time, a "lumpy thyroid" will eventually become overactive.  this is usually a slow process, happening over the span of many years.

## 2015-02-06 NOTE — Patient Instructions (Signed)
Health Recommendations for Postmenopausal Women Respected and ongoing research has looked at the most common causes of death, disability, and poor quality of life in postmenopausal women. The causes include heart disease, diseases of blood vessels, diabetes, depression, cancer, and bone loss (osteoporosis). Many things can be done to help lower the chances of developing these and other common problems. CARDIOVASCULAR DISEASE Heart Disease: A heart attack is a medical emergency. Know the signs and symptoms of a heart attack. Below are things women can do to reduce their risk for heart disease.   Do not smoke. If you smoke, quit.  Aim for a healthy weight. Being overweight causes many preventable deaths. Eat a healthy and balanced diet and drink an adequate amount of liquids.  Get moving. Make a commitment to be more physically active. Aim for 30 minutes of activity on most, if not all days of the week.  Eat for heart health. Choose a diet that is low in saturated fat and cholesterol and eliminate trans fat. Include whole grains, vegetables, and fruits. Read and understand the labels on food containers before buying.  Know your numbers. Ask your caregiver to check your blood pressure, cholesterol (total, HDL, LDL, triglycerides) and blood glucose. Work with your caregiver on improving your entire clinical picture.  High blood pressure. Limit or stop your table salt intake (try salt substitute and food seasonings). Avoid salty foods and drinks. Read labels on food containers before buying. Eating well and exercising can help control high blood pressure. STROKE  Stroke is a medical emergency. Stroke may be the result of a blood clot in a blood vessel in the brain or by a brain hemorrhage (bleeding). Know the signs and symptoms of a stroke. To lower the risk of developing a stroke:  Avoid fatty foods.  Quit smoking.  Control your diabetes, blood pressure, and irregular heart rate. THROMBOPHLEBITIS  (BLOOD CLOT) OF THE LEG  Becoming overweight and leading a stationary lifestyle may also contribute to developing blood clots. Controlling your diet and exercising will help lower the risk of developing blood clots. CANCER SCREENING  Breast Cancer: Take steps to reduce your risk of breast cancer.  You should practice "breast self-awareness." This means understanding the normal appearance and feel of your breasts and should include breast self-examination. Any changes detected, no matter how small, should be reported to your caregiver.  After age 40, you should have a clinical breast exam (CBE) every year.  Starting at age 40, you should consider having a mammogram (breast X-[place]) every year.  If you have a family history of breast cancer, talk to your caregiver about genetic screening.  If you are at high risk for breast cancer, talk to your caregiver about having an MRI and a mammogram every year.  Intestinal or Stomach Cancer: Tests to consider are a rectal exam, fecal occult blood, sigmoidoscopy, and colonoscopy. Women who are high risk may need to be screened at an earlier age and more often.  Cervical Cancer:  Beginning at age 30, you should have a Pap test every 3 years as long as the past 3 Pap tests have been normal.  If you have had past treatment for cervical cancer or a condition that could lead to cancer, you need Pap tests and screening for cancer for at least 20 years after your treatment.  If you had a hysterectomy for a problem that was not cancer or a condition that could lead to cancer, then you no longer need Pap tests.    If you are between ages 65 and 70, and you have had normal Pap tests going back 10 years, you no longer need Pap tests.  If Pap tests have been discontinued, risk factors (such as a new sexual partner) need to be reassessed to determine if screening should be resumed.  Some medical problems can increase the chance of getting cervical cancer. In these  cases, your caregiver may recommend more frequent screening and Pap tests.  Uterine Cancer: If you have vaginal bleeding after reaching menopause, you should notify your caregiver.  Ovarian Cancer: Other than yearly pelvic exams, there are no reliable tests available to screen for ovarian cancer at this time except for yearly pelvic exams.  Lung Cancer: Yearly chest X-rays can detect lung cancer and should be done on high risk women, such as cigarette smokers and women with chronic lung disease (emphysema).  Skin Cancer: A complete body skin exam should be done at your yearly examination. Avoid overexposure to the sun and ultraviolet light lamps. Use a strong sun block cream when in the sun. All of these things are important for lowering the risk of skin cancer. MENOPAUSE Menopause Symptoms: Hormone therapy products are effective for treating symptoms associated with menopause:  Moderate to severe hot flashes.  Night sweats.  Mood swings.  Headaches.  Tiredness.  Loss of sex drive.  Insomnia.  Other symptoms. Hormone replacement carries certain risks, especially in older women. Women who use or are thinking about using estrogen or estrogen with progestin treatments should discuss that with their caregiver. Your caregiver will help you understand the benefits and risks. The ideal dose of hormone replacement therapy is not known. The Food and Drug Administration (FDA) has concluded that hormone therapy should be used only at the lowest doses and for the shortest amount of time to reach treatment goals.  OSTEOPOROSIS Protecting Against Bone Loss and Preventing Fracture If you use hormone therapy for prevention of bone loss (osteoporosis), the risks for bone loss must outweigh the risk of the therapy. Ask your caregiver about other medications known to be safe and effective for preventing bone loss and fractures. To guard against bone loss or fractures, the following is recommended:  If  you are younger than age 50, take 1000 mg of calcium and at least 600 mg of Vitamin D per day.  If you are older than age 50 but younger than age 70, take 1200 mg of calcium and at least 600 mg of Vitamin D per day.  If you are older than age 70, take 1200 mg of calcium and at least 800 mg of Vitamin D per day. Smoking and excessive alcohol intake increases the risk of osteoporosis. Eat foods rich in calcium and vitamin D and do weight bearing exercises several times a week as your caregiver suggests. DIABETES Diabetes Mellitus: If you have type I or type 2 diabetes, you should keep your blood sugar under control with diet, exercise, and recommended medication. Avoid starchy and fatty foods, and too many sweets. Being overweight can make diabetes control more difficult. COGNITION AND MEMORY Cognition and Memory: Menopausal hormone therapy is not recommended for the prevention of cognitive disorders such as Alzheimer's disease or memory loss.  DEPRESSION  Depression may occur at any age, but it is common in elderly women. This may be because of physical, medical, social (loneliness), or financial problems and needs. If you are experiencing depression because of medical problems and control of symptoms, talk to your caregiver about this. Physical   activity and exercise may help with mood and sleep. Community and volunteer involvement may improve your sense of value and worth. If you have depression and you feel that the problem is getting worse or becoming severe, talk to your caregiver about which treatment options are best for you. ACCIDENTS  Accidents are common and can be serious in elderly woman. Prepare your house to prevent accidents. Eliminate throw rugs, place hand bars in bath, shower, and toilet areas. Avoid wearing high heeled shoes or walking on wet, snowy, and icy areas. Limit or stop driving if you have vision or hearing problems, or if you feel you are unsteady with your movements and  reflexes. HEPATITIS C Hepatitis C is a type of viral infection affecting the liver. It is spread mainly through contact with blood from an infected person. It can be treated, but if left untreated, it can lead to severe liver damage over the years. Many people who are infected do not know that the virus is in their blood. If you are a "baby-boomer", it is recommended that you have one screening test for Hepatitis C. IMMUNIZATIONS  Several immunizations are important to consider having during your senior years, including:   Tetanus, diphtheria, and pertussis booster shot.  Influenza every year before the flu season begins.  Pneumonia vaccine.  Shingles vaccine.  Others, as indicated based on your specific needs. Talk to your caregiver about these. Document Released: 01/27/2006 Document Revised: 04/21/2014 Document Reviewed: 09/22/2008 ExitCare Patient Information 2015 [employer]. This information is not intended to replace advice given to you by your health care provider. Make sure you discuss any questions you have with your health care provider.  

## 2015-02-06 NOTE — Progress Notes (Signed)
Subjective:    Patient ID: Cynthia Berger, female    DOB: [birth year], 59 y.o.   MRN: [id number]  HPI Pt was noted in 2013 to have a multinodular goiter.  bx was low-risk.  She does not notice the goiter.  pt states she feels well in general.    She does not notice the goiter.   Past Medical History  Diagnosis Date  . LGSIL (low grade squamous intraepithelial dysplasia) 2013     C&B Inadequate ECC--positive LGSIL --  . Vitamin D deficiency 11/2007    LOW AT 18  . AC (acromioclavicular) joint bone spurs     HEEL BONE SPURS-SURGERY 12/2010 LEFT FOOT  . Antral gastritis   . Positive H. pylori test   . Uterine fibroid   . GERD (gastroesophageal reflux disease)   . Hypertension   . H/O hiatal hernia   . PMB (postmenopausal bleeding)   . Heart palpitations   . DDD (degenerative disc disease), cervical   . History of kidney cancer 2005    S/P RIGHT NEPHRECTOMY   . Multinodular goiter     RIGHT LOBE-- PER BX NEGATIVE  . Hiatal hernia   . Esophageal dysmotility     Past Surgical History  Procedure Laterality Date  . Cesarean section  1993    W/ TUBAL LIGATION, BILATERAL  . Nephrectomy  2005    RIGHT KIDNEY CANCER  . Rotator cuff repair  2010    RIGHT  . Foot surgery  2012    left heel spur removed  . Transthoracic echocardiogram  04-10-2012  DR BRIEN CRENSHAW    LVSF MILDLY REDUCED/ EF 45-50%  . Hysteroscopy w/d&c  06/08/2012    Procedure: DILATATION AND CURETTAGE /HYSTEROSCOPY;  Surgeon: Anastasio Auerbach, MD;  Location: [facility];  Service: Gynecology;  Laterality: N/A;  WITH RESECTION OF MYOMA  . Cervical biopsy  w/ loop electrode excision  10/2012    for persistent low-grade dysplasia  . Tubal ligation    . Esophogeal dilatation    . Salpingoophorectomy Bilateral 05/28/2013    Procedure: BILATERAL SALPINGO OOPHORECTOMY;  Surgeon: Anastasio Auerbach, MD;  Location: [facility];  Service: Gynecology;  Laterality: Bilateral;  . Abdominal hysterectomy N/A  05/28/2013    Procedure: HYSTERECTOMY ABDOMINAL;  Surgeon: Anastasio Auerbach, MD;  Location: [facility];  Service: Gynecology;  Laterality: N/A;  . Cystoscopy N/A 05/28/2013    Procedure: CYSTOSCOPY;  Surgeon: Anastasio Auerbach, MD;  Location: [facility];  Service: Gynecology;  Laterality: N/A;  . Biopsy thyroid Right 2014    ENLARGED THYROID NO MEDS BENIGN  . S/p right nephrectomy Right     History   Social History  . Marital Status: Widowed    Spouse Name: N/A  . Number of Children: 4  . Years of Education: N/A   Occupational History  . [employer]    Social History Main Topics  . Smoking status: Never Smoker   . Smokeless tobacco: Never Used  . Alcohol Use: 0.0 oz/week    0 Standard drinks or equivalent per week     Comment: occasional  . Drug Use: No  . Sexual Activity:    Partners: Male    Birth Control/ Protection: Post-menopausal     Comment: INTERCOURSE AGE 59, SEXUAL PARTNERS LESS THAN 5   Other Topics Concern  . Not on file   Social History Narrative    Current Outpatient Prescriptions on File Prior to Visit  Medication Sig Dispense Refill  .  amLODipine (NORVASC) 2.5 MG tablet TAKE ONE TABLET BY MOUTH ONCE DAILY 90 tablet 0  . BYSTOLIC 5 MG tablet TAKE ONE-HALF TABLET BY MOUTH ONCE DAILY 45 tablet 0  . dexlansoprazole (DEXILANT) 60 MG capsule Take 1 capsule (60 mg total) by mouth daily. 90 capsule 3  . Multiple Vitamins-Minerals (MULTIVITAMIN PO) Take 1 tablet by mouth daily.     . [DISCONTINUED] dicyclomine (BENTYL) 20 MG tablet Take 1 tablet (20 mg total) by mouth 2 (two) times daily. 40 tablet 0   No current facility-administered medications on file prior to visit.    Allergies  Allergen Reactions  . Penicillins Rash    Family History  Problem Relation Age of Onset  . Hypertension Mother   . Hypertension Sister   . Hypertension Son   . Colon cancer Neg Hx   . Esophageal cancer Neg Hx   . Rectal cancer Neg Hx   . Stomach cancer Neg Hx     BP 134/94 mmHg   Pulse 61  Temp(Src) 98.1 F (36.7 C) (Oral)  Ht 5\' 3"  (1.6 m)  Wt 191 lb (86.637 kg)  BMI 33.84 kg/m2  LMP 08/23/2006  Review of Systems She has weight gain.     Objective:   Physical Exam VITAL SIGNS:  See vs page GENERAL: no distress NECK: multinodular goiter, including the right nodule, is again easily palpable.    Lab Results  Component Value Date   TSH 0.91 06/09/2014      Assessment & Plan:  Multinodular goiter, clinically unchanged.  Euthyroid.  Patient is advised the following: Patient Instructions  In my opinion, we can wait another year to recheck the ultrasound.  Please return in 1 year.   Please continue to have the thyroid blood test checked with Dr Shawna Orleans, at least once per year.   most of the time, a "lumpy thyroid" will eventually become overactive.  this is usually a slow process, happening over the span of many years.

## 2015-02-06 NOTE — Progress Notes (Signed)
Cynthia Berger 1956-02-09 [id number]    History:    Presents for annual exam.  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 Paps normal after. Normal mammogram history. Negative colonoscopy 2008. Normal DEXA 2015. Hypertension managed by primary care. Significant history 2005 renal carcinoma right nephrectomy. History of benign thyroid nodules managed by endocrinologist. Widowed, remarried 11/2013. Has gained approximately 40 pounds in the past 5 years.  Past medical history, past surgical history, family history and social history were all reviewed and documented in the EPIC chart. Works at [employer]. 4 children all doing well.  ROS:  A ROS was performed and pertinent positives and negatives are included.  Exam:  Filed Vitals:   02/06/15 1034  BP: 150/80    General appearance:  Normal Thyroid:  Symmetrical, normal in size, without palpable masses or nodularity. Respiratory  Auscultation:  Clear without wheezing or rhonchi Cardiovascular  Auscultation:  Regular rate, without rubs, murmurs or gallops  Edema/varicosities:  Not grossly evident Abdominal  Soft,nontender, without masses, guarding or rebound.  Liver/spleen:  No organomegaly noted  Hernia:  None appreciated  Skin  Inspection:  Grossly normal   Breasts: Examined lying and sitting.     Right: Without masses, retractions, discharge or axillary adenopathy.     Left: Without masses, retractions, discharge or axillary adenopathy. Gentitourinary   Inguinal/mons:  Normal without inguinal adenopathy  External genitalia:  Normal  BUS/Urethra/Skene's glands:  Normal  Vagina:  Normal  Cervix:  and uterus absent              Adnexa/parametria:     Rt: Without masses or tenderness.   Lt: Without masses or tenderness.  Anus and perineum: Normal  Digital rectal exam: Normal sphincter tone without palpated masses or tenderness  Assessment/Plan:  59 y.o. MBF G4P4 for annual exam with no complaints.  05/2013  TAH with BSO for persistent LGSIL positive HR HPV Hypertension-primary care manages labs and meds 2005 right renal carcinoma/nephrectomy Benign thyroid nodules-endocrinologist manages  Plan: Pap, UA. SBE's, continue annual screening mammogram, calcium rich diet, vitamin D 2000 daily encouraged. Low normal vitamin D level in the past. Reviewed importance of exercise, calcium rich diet.  Home safety and fall prevention discussed. Encouraged to discuss Pneumovax with primary care, Zostavax at 25.     Huel Cote WHNP, 1:11 PM 02/06/2015

## 2015-02-07 LAB — URINALYSIS W MICROSCOPIC + REFLEX CULTURE
BILIRUBIN URINE: NEGATIVE
Casts: NONE SEEN
Crystals: NONE SEEN
Glucose, UA: NEGATIVE mg/dL
Hgb urine dipstick: NEGATIVE
Ketones, ur: NEGATIVE mg/dL
LEUKOCYTES UA: NEGATIVE
Nitrite: NEGATIVE
PROTEIN: NEGATIVE mg/dL
SPECIFIC GRAVITY, URINE: 1.005 (ref 1.005–1.030)
SQUAMOUS EPITHELIAL / LPF: NONE SEEN
Urobilinogen, UA: 0.2 mg/dL (ref 0.0–1.0)
pH: 5.5 (ref 5.0–8.0)

## 2015-02-09 ENCOUNTER — Other Ambulatory Visit: Payer: Self-pay | Admitting: Physician Assistant

## 2015-02-10 LAB — CYTOLOGY - PAP

## 2015-02-27 ENCOUNTER — Other Ambulatory Visit (INDEPENDENT_AMBULATORY_CARE_PROVIDER_SITE_OTHER): Payer: 59

## 2015-02-27 DIAGNOSIS — Z0000 Encounter for general adult medical examination without abnormal findings: Secondary | ICD-10-CM

## 2015-02-27 LAB — POCT URINALYSIS DIPSTICK
Bilirubin, UA: NEGATIVE
Glucose, UA: NEGATIVE
Ketones, UA: NEGATIVE
Leukocytes, UA: NEGATIVE
NITRITE UA: NEGATIVE
PROTEIN UA: NEGATIVE
SPEC GRAV UA: 1.01
UROBILINOGEN UA: 0.2
pH, UA: 5.5

## 2015-02-27 LAB — CBC WITH DIFFERENTIAL/PLATELET
BASOS ABS: 0 10*3/uL (ref 0.0–0.1)
BASOS PCT: 0.5 % (ref 0.0–3.0)
EOS ABS: 0.2 10*3/uL (ref 0.0–0.7)
Eosinophils Relative: 3.1 % (ref 0.0–5.0)
HCT: 40 % (ref 36.0–46.0)
Hemoglobin: 13.3 g/dL (ref 12.0–15.0)
LYMPHS PCT: 28.7 % (ref 12.0–46.0)
Lymphs Abs: 2 10*3/uL (ref 0.7–4.0)
MCHC: 33.3 g/dL (ref 30.0–36.0)
MCV: 86.7 fl (ref 78.0–100.0)
MONOS PCT: 10.9 % (ref 3.0–12.0)
Monocytes Absolute: 0.7 10*3/uL (ref 0.1–1.0)
NEUTROS PCT: 56.8 % (ref 43.0–77.0)
Neutro Abs: 3.9 10*3/uL (ref 1.4–7.7)
Platelets: 256 10*3/uL (ref 150.0–400.0)
RBC: 4.62 Mil/uL (ref 3.87–5.11)
RDW: 13.6 % (ref 11.5–15.5)
WBC: 6.9 10*3/uL (ref 4.0–10.5)

## 2015-02-27 LAB — COMPREHENSIVE METABOLIC PANEL
ALK PHOS: 85 U/L (ref 39–117)
ALT: 15 U/L (ref 0–35)
AST: 20 U/L (ref 0–37)
Albumin: 4 g/dL (ref 3.5–5.2)
BILIRUBIN TOTAL: 0.5 mg/dL (ref 0.2–1.2)
BUN: 16 mg/dL (ref 6–23)
CALCIUM: 9.6 mg/dL (ref 8.4–10.5)
CO2: 28 mEq/L (ref 19–32)
Chloride: 104 mEq/L (ref 96–112)
Creatinine, Ser: 1.23 mg/dL — ABNORMAL HIGH (ref 0.40–1.20)
GFR: 57.71 mL/min — ABNORMAL LOW (ref >60.00)
GLUCOSE: 83 mg/dL (ref 70–99)
Potassium: 4.6 mEq/L (ref 3.5–5.1)
SODIUM: 135 meq/L (ref 135–145)
Total Protein: 7.8 g/dL (ref 6.0–8.3)

## 2015-02-27 LAB — LIPID PANEL
CHOLESTEROL: 158 mg/dL (ref 0–200)
HDL: 55.2 mg/dL (ref >39.00)
LDL Cholesterol: 92 mg/dL (ref 0–99)
NonHDL: 102.8
Total CHOL/HDL Ratio: 3
Triglycerides: 56 mg/dL (ref 0.0–149.0)
VLDL: 11.2 mg/dL (ref 0.0–40.0)

## 2015-02-27 LAB — TSH: TSH: 0.57 u[IU]/mL (ref 0.35–4.50)

## 2015-03-06 ENCOUNTER — Encounter: Payer: Self-pay | Admitting: Internal Medicine

## 2015-03-06 ENCOUNTER — Ambulatory Visit (INDEPENDENT_AMBULATORY_CARE_PROVIDER_SITE_OTHER): Payer: 59 | Admitting: Internal Medicine

## 2015-03-06 VITALS — BP 140/90 | HR 62 | Temp 98.2°F | Wt 189.0 lb

## 2015-03-06 DIAGNOSIS — R42 Dizziness and giddiness: Secondary | ICD-10-CM

## 2015-03-06 DIAGNOSIS — I10 Essential (primary) hypertension: Secondary | ICD-10-CM

## 2015-03-06 DIAGNOSIS — Z0000 Encounter for general adult medical examination without abnormal findings: Secondary | ICD-10-CM

## 2015-03-06 NOTE — Progress Notes (Signed)
Subjective:    Patient ID: Cynthia Berger, female    DOB: 06/09/1956, 59 y.o.   MRN: [id number]  HPI  59 year old African American female with history of solitary kidney, Multinodular goiter and hypertension for follow-up. Patient denies any significant interval medical history. She is followed by endocrinologist.  Patient reports experiencing episode of transient dizziness several weeks ago. No precipitating symptoms.    No chest pain. No shortness of breath  Review of Systems  Constitutional: Negative for activity change, appetite change and unexpected weight change.  Eyes: Negative for visual disturbance.  Respiratory: Negative for cough, chest tightness and shortness of breath.   Cardiovascular: Negative for chest pain. negative for palpitations Genitourinary: Negative for difficulty urinating.  Neurological: Negative for headaches.  Gastrointestinal: Negative for abdominal pain, heartburn melena or hematochezia Psych: Negative for depression or anxiety Endo:  No polyuria or polydypsia        Past Medical History  Diagnosis Date  . LGSIL (low grade squamous intraepithelial dysplasia) 2013     C&B Inadequate ECC--positive LGSIL --  . Vitamin D deficiency 11/2007    LOW AT 18  . AC (acromioclavicular) joint bone spurs     HEEL BONE SPURS-SURGERY 12/2010 LEFT FOOT  . Antral gastritis   . Positive H. pylori test   . Uterine fibroid   . GERD (gastroesophageal reflux disease)   . Hypertension   . H/O hiatal hernia   . PMB (postmenopausal bleeding)   . Heart palpitations   . DDD (degenerative disc disease), cervical   . History of kidney cancer 2005    S/P RIGHT NEPHRECTOMY   . Multinodular goiter     RIGHT LOBE-- PER BX NEGATIVE  . Hiatal hernia   . Esophageal dysmotility     History   Social History  . Marital Status: Widowed    Spouse Name: N/A  . Number of Children: 4  . Years of Education: N/A   Occupational History  . [employer]    Social History Main  Topics  . Smoking status: Never Smoker   . Smokeless tobacco: Never Used  . Alcohol Use: 0.0 oz/week    0 Standard drinks or equivalent per week     Comment: occasional  . Drug Use: No  . Sexual Activity:    Partners: Male    Birth Control/ Protection: Post-menopausal     Comment: INTERCOURSE AGE 55, SEXUAL PARTNERS LESS THAN 5   Other Topics Concern  . Not on file   Social History Narrative    Past Surgical History  Procedure Laterality Date  . Cesarean section  1993    W/ TUBAL LIGATION, BILATERAL  . Nephrectomy  2005    RIGHT KIDNEY CANCER  . Rotator cuff repair  2010    RIGHT  . Foot surgery  2012    left heel spur removed  . Transthoracic echocardiogram  04-10-2012  DR BRIEN CRENSHAW    LVSF MILDLY REDUCED/ EF 45-50%  . Hysteroscopy w/d&c  06/08/2012    Procedure: DILATATION AND CURETTAGE /HYSTEROSCOPY;  Surgeon: Anastasio Auerbach, MD;  Location: [facility];  Service: Gynecology;  Laterality: N/A;  WITH RESECTION OF MYOMA  . Cervical biopsy  w/ loop electrode excision  10/2012    for persistent low-grade dysplasia  . Tubal ligation    . Esophogeal dilatation    . Salpingoophorectomy Bilateral 05/28/2013    Procedure: BILATERAL SALPINGO OOPHORECTOMY;  Surgeon: Anastasio Auerbach, MD;  Location: [facility];  Service: Gynecology;  Laterality: Bilateral;  . Abdominal hysterectomy N/A 05/28/2013    Procedure: HYSTERECTOMY ABDOMINAL;  Surgeon: Anastasio Auerbach, MD;  Location: [facility];  Service: Gynecology;  Laterality: N/A;  . Cystoscopy N/A 05/28/2013    Procedure: CYSTOSCOPY;  Surgeon: Anastasio Auerbach, MD;  Location: [facility];  Service: Gynecology;  Laterality: N/A;  . Biopsy thyroid Right 2014    ENLARGED THYROID NO MEDS BENIGN  . S/p right nephrectomy Right     Family History  Problem Relation Age of Onset  . Hypertension Mother   . Hypertension Sister   . Hypertension Son   . Colon cancer Neg Hx   . Esophageal cancer Neg Hx   . Rectal cancer Neg Hx    . Stomach cancer Neg Hx     Allergies  Allergen Reactions  . Penicillins Rash    Current Outpatient Prescriptions on File Prior to Visit  Medication Sig Dispense Refill  . amLODipine (NORVASC) 2.5 MG tablet TAKE ONE TABLET BY MOUTH ONCE DAILY 90 tablet 0  . BYSTOLIC 5 MG tablet TAKE ONE-HALF TABLET BY MOUTH ONCE DAILY 45 tablet 0  . cholecalciferol (VITAMIN D) 1000 UNITS tablet Take 1,000 Units by mouth daily.    Marland Kitchen DEXILANT 60 MG capsule TAKE ONE CAPSULE BY MOUTH ONCE DAILY 90 capsule 0  . Multiple Vitamins-Minerals (MULTIVITAMIN PO) Take 1 tablet by mouth daily.     . [DISCONTINUED] dicyclomine (BENTYL) 20 MG tablet Take 1 tablet (20 mg total) by mouth 2 (two) times daily. 40 tablet 0   No current facility-administered medications on file prior to visit.    BP 140/90 mmHg  Pulse 62  Temp(Src) 98.2 F (36.8 C)  Wt 189 lb (85.73 kg)  LMP 08/23/2006    Objective:   Physical Exam  Constitutional: She is oriented to person, place, and time. She appears well-developed and well-nourished. No distress.  HENT:  Head: Normocephalic and atraumatic.  Right Ear: External ear normal.  Left Ear: External ear normal.  Mouth/Throat: Oropharynx is clear and moist.  Eyes: Conjunctivae and EOM are normal. Pupils are equal, round, and reactive to light.  Neck: Neck supple.  No carotid bruit  Cardiovascular: Normal rate, regular rhythm and normal heart sounds.  Exam reveals no gallop.   No murmur heard. Pulmonary/Chest: Effort normal and breath sounds normal. She has no wheezes.  Abdominal: Soft. Bowel sounds are normal. She exhibits no mass. There is no tenderness.  Musculoskeletal: She exhibits no edema.  Lymphadenopathy:    She has no cervical adenopathy.  Neurological: She is alert and oriented to person, place, and time. No cranial nerve deficit.  Skin: Skin is warm and dry.  Psychiatric: She has a normal mood and affect. Her behavior is normal.          Assessment & Plan:

## 2015-03-06 NOTE — Assessment & Plan Note (Signed)
Reviewed adult health maintenance protocols.  Weight loss encouraged.  Her kidney function is stable.  She is followed by her GYN for mammogram and routine Pap and pelvic.  Patient up-to-date with Tdap.

## 2015-03-06 NOTE — Assessment & Plan Note (Signed)
Patient experiencing episode of dizziness. This may related to bradycardia. Discontinue Bystolic. Switch to metoprolol XL 25 mg once daily. Increase amlodipine to 5 mg.

## 2015-03-06 NOTE — Assessment & Plan Note (Addendum)
59 year old African-American female complains of episode of transient dizziness once 2 weeks ago. EKG shows sinus arrhythmia at 55 bpm. Discontinue Bystolic. Change to metoprolol XL 25 mg once daily.  Monitor BP and pulse at home.  If persistent symptoms, consider cardiology evaluation.

## 2015-03-16 ENCOUNTER — Telehealth: Payer: Self-pay

## 2015-03-16 NOTE — Telephone Encounter (Signed)
PLEASE NOTE: All timestamps contained within this report are represented as [place] Standard Time. CONFIDENTIALTY NOTICE: This fax transmission is intended only for the addressee. It contains information that is legally privileged, confidential or otherwise protected from use or disclosure. If you are not the intended recipient, you are strictly prohibited from reviewing, disclosing, copying using or disseminating any of this information or taking any action in reliance on or regarding this information. If you have received this fax in error, please notify us immediately by telephone so that we can arrange for its return to [place]. Phone: [phone], Toll-Free: [phone], Fax: [phone] Page: 1 of 2 Call Id: [id number] [facility] - Client [place] Patient Name: Cynthia Berger Gender: Female DOB: [birth year] Age: 59 Y 10 M 1 D Return Phone Number: [phone] (Primary) Address: [address] City/State/Zip: [place] [address] Client [facility] Night - Client Client Site [facility] - Night Physician Shawna Orleans, Doe Kingsley Plan "Rob" Contact Type Call Call Type Triage / Clinical Relationship To Patient Self Return Phone Number [phone] (Primary) Chief Complaint Hives Initial Comment Caller states that she was prescribed new blood pressure rx, is breaking out and itching and has hives PreDisposition Go to ED Nurse Assessment Nurse: Denyse Amass, RN, Benjamine Mola Date/Time (Eastern Time): 03/13/2015 9:18:49 PM Confirm and document reason for call. If symptomatic, describe symptoms. ---Patient states she was given Metoprolol ER 25 mg to take once a day. she has been taking it for a week and yesterday she developed hives and itching all over. Has the patient traveled out of the country within the last 30 days? ---Not Applicable Does the patient require triage?  ---Yes Related visit to physician within the last 2 weeks? ---Yes Does the PT have any chronic conditions? (i.e. diabetes, asthma, etc.) ---Yes List chronic conditions. ---HTN Guidelines Guideline Title Affirmed Question Affirmed Notes Nurse Date/Time Eilene Ghazi Time) Hives Widespread hives (all triage questions negative) [place], RN, Benjamine Mola 03/13/2015 9:21:40 PM Disp. Time Eilene Ghazi Time) Disposition Final User 03/13/2015 9:26:05 PM Home Care Yes Greenawalt, RN, Lorin Glass Understands: Yes Disagree/Comply: Comply Care Advice Given Per Guideline PLEASE NOTE: All timestamps contained within this report are represented as [place] Standard Time. CONFIDENTIALTY NOTICE: This fax transmission is intended only for the addressee. It contains information that is legally privileged, confidential or otherwise protected from use or disclosure. If you are not the intended recipient, you are strictly prohibited from reviewing, disclosing, copying using or disseminating any of this information or taking any action in reliance on or regarding this information. If you have received this fax in error, please notify us immediately by telephone so that we can arrange for its return to [place]. Phone: [phone], Toll-Free: [phone], Fax: [phone] Page: 2 of 2 Call Id: [id number] Care Advice Given Per Guideline HOME CARE: You should be able to treat this at home. REASSURANCE: It sounds like mild hives. They are usually part of a viral infection. CETIRIZINE OR LORATADINE: * Take cetirizine (e.g., Zyrtec) or loratadine once each day for hives that itch. Continue taking it once a day until the hives are gone for 24 hours. * CETIRIZINE (e.g., Zyrtec): Adult dosage is 10 mg once each day. * LORATADINE (i.g., Alavert, Claritin): Adult dosage is 10 mg once each day. * Cetrizine and loratadine are newer (second generation) antihistamine medications that cause less sedation than diphenhydramine  (Benadryl). They have the added advantage of being long-acting (last up to 24 hours). * If you  do not have cetrizine or loratadine, you can instead take diphendyramine (e.g., OTC Benadryl 25-50 mg PO) four times a day for hives that itch. CAUTION - ANTIHISTAMINES: * Examples include diphenhydramine (Benadryl) and chlorpheniramine (Chlortrimeton, Chlor-tripolon) * May cause sleepiness. Do not drink alcohol, drive or operate dangerous machinery while taking antihistamines. * Read the package instructions and warnings. COOL BATH: Take a cool bath for 10 minutes to relieve itching. (Caution: avoid any chill) Rub very itchy areas with an ice cube for 10 minutes. REMOVE ALLERGENS: Take a bath or shower if triggered by pollens or animal contact. Change clothes PREVENTION - AVOID ALLERGENS: * If you identify a substance that causes hives, avoid that substance in the future. * Minimize stress, alcohol intake, temperature extremes, and sun exposure. EXPECTED COURSE: Hives from viral illness normally come and go for 3 or 4 days, then disappear. Most people get hives once. CALL BACK IF: * Severe hives or severe itching persist over 24 hours despite taking an antihistamine (e.g., Benadryl) * Hives last over 1 week * You become worse. After Care Instructions Given Call Event Type User Date / Time DescriptionPLEASE NOTE: All timestamps contained within this report are represented as [place] Standard Time. CONFIDENTIALTY NOTICE: This fax transmission is intended only for the addressee. It contains information that is legally privileged, confidential or otherwise protected from use or disclosure. If you are not the intended recipient, you are strictly prohibited from reviewing, disclosing, copying using or disseminating any of this information or taking any action in reliance on or regarding this information. If you have received this fax in error, please notify us immediately by telephone so that we can arrange for  its return to [place]. Phone: [phone], Toll-Free: [phone], Fax: [phone] Page: 1 of 2 Call Id: [id number] [facility] - Client [place] Patient Name: Cynthia Berger Gender: Female DOB: 1956/09/29 Age: 40 Y 10 M 1 D Return Phone Number: [phone] (Primary) Address: [address] City/State/Zip: [place] [address] Client [facility] Night - Client Client Site [facility] - Night Physician Shawna Orleans, Doe Kingsley Plan "Rob" Contact Type Call Call Type Triage / Clinical Relationship To Patient Self Return Phone Number [phone] (Primary) Chief Complaint Hives Initial Comment Caller states that she was prescribed new blood pressure rx, is breaking out and itching and has hives PreDisposition Go to ED Nurse Assessment Nurse: Denyse Amass, RN, Benjamine Mola Date/Time (Eastern Time): 03/13/2015 9:18:49 PM Confirm and document reason for call. If symptomatic, describe symptoms. ---Patient states she was given Metoprolol ER 25 mg to take once a day. she has been taking it for a week and yesterday she developed hives and itching all over. Has the patient traveled out of the country within the last 30 days? ---Not Applicable Does the patient require triage? ---Yes Related visit to physician within the last 2 weeks? ---Yes Does the PT have any chronic conditions? (i.e. diabetes, asthma, etc.) ---Yes List chronic conditions. ---HTN Guidelines Guideline Title Affirmed Question Affirmed Notes Nurse Date/Time Eilene Ghazi Time) Hives Widespread hives (all triage questions negative) Greenawalt, RN, Benjamine Mola 03/13/2015 9:21:40 PM Disp. Time Eilene Ghazi Time) Disposition Final User 03/13/2015 9:26:05 PM Home Care Yes Greenawalt, RN, Lorin Glass Understands: Yes Disagree/Comply: Comply Care Advice Given Per Guideline PLEASE NOTE: All timestamps contained within this report  are represented as [place] Standard Time. CONFIDENTIALTY NOTICE: This fax transmission is intended only for the addressee. It contains information that is legally privileged, confidential or otherwise  protected from use or disclosure. If you are not the intended recipient, you are strictly prohibited from reviewing, disclosing, copying using or disseminating any of this information or taking any action in reliance on or regarding this information. If you have received this fax in error, please notify us immediately by telephone so that we can arrange for its return to [place]. Phone: [phone], Toll-Free: [phone], Fax: [phone] Page: 2 of 2 Call Id: [id number] Care Advice Given Per Guideline HOME CARE: You should be able to treat this at home. REASSURANCE: It sounds like mild hives. They are usually part of a viral infection. CETIRIZINE OR LORATADINE: * Take cetirizine (e.g., Zyrtec) or loratadine once each day for hives that itch. Continue taking it once a day until the hives are gone for 24 hours. * CETIRIZINE (e.g., Zyrtec): Adult dosage is 10 mg once each day. * LORATADINE (i.g., Alavert, Claritin): Adult dosage is 10 mg once each day. * Cetrizine and loratadine are newer (second generation) antihistamine medications that cause less sedation than diphenhydramine (Benadryl). They have the added advantage of being long-acting (last up to 24 hours). * If you do not have cetrizine or loratadine, you can instead take diphendyramine (e.g., OTC Benadryl 25-50 mg PO) four times a day for hives that itch. CAUTION - ANTIHISTAMINES: * Examples include diphenhydramine (Benadryl) and chlorpheniramine (Chlortrimeton, Chlor-tripolon) * May cause sleepiness. Do not drink alcohol, drive or operate dangerous machinery while taking antihistamines. * Read the package instructions and warnings. COOL BATH: Take a cool bath for 10 minutes to relieve itching. (Caution: avoid any chill) Rub very itchy areas with an  ice cube for 10 minutes. REMOVE ALLERGENS: Take a bath or shower if triggered by pollens or animal contact. Change clothes PREVENTION - AVOID ALLERGENS: * If you identify a substance that causes hives, avoid that substance in the future. * Minimize stress, alcohol intake, temperature extremes, and sun exposure. EXPECTED COURSE: Hives from viral illness normally come and go for 3 or 4 days, then disappear. Most people get hives once. CALL BACK IF: * Severe hives or severe itching persist over 24 hours despite taking an antihistamine (e.g., Benadryl) * Hives last over 1 week * You become worse. After Care Instructions Given Call Event Type User Date / Time Description    Called to follow up with patient to get an update on how patient was feeling. Left a message for return call.

## 2015-03-17 NOTE — Telephone Encounter (Signed)
Pt had some herbal tea and she started itching.  She has been taking benadryl and the itching has since stopped.  Told pt to the ER if she experienced throat swelling , breathing issues, or hives.  She verbalized understanding and had no questions.

## 2015-03-20 ENCOUNTER — Encounter: Payer: Self-pay | Admitting: Internal Medicine

## 2015-05-15 ENCOUNTER — Ambulatory Visit: Payer: 59 | Admitting: Internal Medicine

## 2015-05-19 ENCOUNTER — Ambulatory Visit: Payer: 59 | Admitting: Internal Medicine

## 2015-05-22 ENCOUNTER — Encounter: Payer: Self-pay | Admitting: Internal Medicine

## 2015-05-22 ENCOUNTER — Ambulatory Visit (INDEPENDENT_AMBULATORY_CARE_PROVIDER_SITE_OTHER): Payer: 59 | Admitting: Internal Medicine

## 2015-05-22 VITALS — BP 132/90 | HR 67 | Temp 98.2°F | Resp 20 | Ht 63.0 in | Wt 187.0 lb

## 2015-05-22 DIAGNOSIS — K219 Gastro-esophageal reflux disease without esophagitis: Secondary | ICD-10-CM | POA: Diagnosis not present

## 2015-05-22 DIAGNOSIS — I10 Essential (primary) hypertension: Secondary | ICD-10-CM

## 2015-05-22 NOTE — Progress Notes (Signed)
Pre visit review using our clinic review tool, if applicable. No additional management support is needed unless otherwise documented below in the visit note. 

## 2015-05-22 NOTE — Patient Instructions (Signed)
Limit your sodium (Salt) intake  Please check your blood pressure on a regular basis.  If it is consistently greater than 150/90, please make an office appointment.  Return in 9 months for your annual exam

## 2015-05-22 NOTE — Progress Notes (Signed)
Subjective:    Patient ID: Cynthia Berger, female    DOB: 1956-03-02, 59 y.o.   MRN: [id number]  HPI 59 year old patient who has a history of essential hypertension.  She was seen 3 months ago for her annual preventive health examination.  She complained of some dizziness and did have some bradycardia.  Metoprolol was substituted for bystolic, and amlodipine was increased to 5 mg daily.  She feels quite well on this regimen and has had no weakness or dizziness.  She does monitor home blood pressure readings with very nice results.  She states her blood pressure has been well controlled and improved on her present regimen.  She usually gets readings in the low normal range  Past Medical History  Diagnosis Date  . LGSIL (low grade squamous intraepithelial dysplasia) 2013     C&B Inadequate ECC--positive LGSIL --  . Vitamin D deficiency 11/2007    LOW AT 17  . AC (acromioclavicular) joint bone spurs     HEEL BONE SPURS-SURGERY 12/2010 LEFT FOOT  . Antral gastritis   . Positive H. pylori test   . Uterine fibroid   . GERD (gastroesophageal reflux disease)   . Hypertension   . H/O hiatal hernia   . PMB (postmenopausal bleeding)   . Heart palpitations   . DDD (degenerative disc disease), cervical   . History of kidney cancer 2005    S/P RIGHT NEPHRECTOMY   . Multinodular goiter     RIGHT LOBE-- PER BX NEGATIVE  . Hiatal hernia   . Esophageal dysmotility     History   Social History  . Marital Status: Widowed    Spouse Name: N/A  . Number of Children: 4  . Years of Education: N/A   Occupational History  . [employer]    Social History Main Topics  . Smoking status: Never Smoker   . Smokeless tobacco: Never Used  . Alcohol Use: 0.0 oz/week    0 Standard drinks or equivalent per week     Comment: occasional  . Drug Use: No  . Sexual Activity:    Partners: Male    Birth Control/ Protection: Post-menopausal     Comment: INTERCOURSE AGE 56, SEXUAL PARTNERS LESS THAN 5   Other  Topics Concern  . Not on file   Social History Narrative    Past Surgical History  Procedure Laterality Date  . Cesarean section  1993    W/ TUBAL LIGATION, BILATERAL  . Nephrectomy  2005    RIGHT KIDNEY CANCER  . Rotator cuff repair  2010    RIGHT  . Foot surgery  2012    left heel spur removed  . Transthoracic echocardiogram  04-10-2012  DR BRIEN CRENSHAW    LVSF MILDLY REDUCED/ EF 45-50%  . Hysteroscopy w/d&c  06/08/2012    Procedure: DILATATION AND CURETTAGE /HYSTEROSCOPY;  Surgeon: Anastasio Auerbach, MD;  Location: [facility];  Service: Gynecology;  Laterality: N/A;  WITH RESECTION OF MYOMA  . Cervical biopsy  w/ loop electrode excision  10/2012    for persistent low-grade dysplasia  . Tubal ligation    . Esophogeal dilatation    . Salpingoophorectomy Bilateral 05/28/2013    Procedure: BILATERAL SALPINGO OOPHORECTOMY;  Surgeon: Anastasio Auerbach, MD;  Location: [facility];  Service: Gynecology;  Laterality: Bilateral;  . Abdominal hysterectomy N/A 05/28/2013    Procedure: HYSTERECTOMY ABDOMINAL;  Surgeon: Anastasio Auerbach, MD;  Location: [facility];  Service: Gynecology;  Laterality: N/A;  . Cystoscopy  N/A 05/28/2013    Procedure: CYSTOSCOPY;  Surgeon: Anastasio Auerbach, MD;  Location: [facility];  Service: Gynecology;  Laterality: N/A;  . Biopsy thyroid Right 2014    ENLARGED THYROID NO MEDS BENIGN  . S/p right nephrectomy Right     Family History  Problem Relation Age of Onset  . Hypertension Mother   . Hypertension Sister   . Hypertension Son   . Colon cancer Neg Hx   . Esophageal cancer Neg Hx   . Rectal cancer Neg Hx   . Stomach cancer Neg Hx     Allergies  Allergen Reactions  . Penicillins Rash    Current Outpatient Prescriptions on File Prior to Visit  Medication Sig Dispense Refill  . amLODipine (NORVASC) 2.5 MG tablet TAKE ONE TABLET BY MOUTH ONCE DAILY 90 tablet 0  . cholecalciferol (VITAMIN D) 1000 UNITS tablet Take 1,000 Units by mouth  daily.    Marland Kitchen DEXILANT 60 MG capsule TAKE ONE CAPSULE BY MOUTH ONCE DAILY 90 capsule 0  . Multiple Vitamins-Minerals (MULTIVITAMIN PO) Take 1 tablet by mouth daily.     . [DISCONTINUED] dicyclomine (BENTYL) 20 MG tablet Take 1 tablet (20 mg total) by mouth 2 (two) times daily. 40 tablet 0   No current facility-administered medications on file prior to visit.    BP 132/90 mmHg  Pulse 67  Temp(Src) 98.2 F (36.8 C) (Oral)  Resp 20  Ht 5\' 3"  (1.6 m)  Wt 187 lb (84.823 kg)  BMI 33.13 kg/m2  SpO2 98%  LMP 08/23/2006      Review of Systems  Constitutional: Negative.   HENT: Negative for congestion, dental problem, hearing loss, rhinorrhea, sinus pressure, sore throat and tinnitus.   Eyes: Negative for pain, discharge and visual disturbance.  Respiratory: Negative for cough and shortness of breath.   Cardiovascular: Negative for chest pain, palpitations and leg swelling.  Gastrointestinal: Negative for nausea, vomiting, abdominal pain, diarrhea, constipation, blood in stool and abdominal distention.  Genitourinary: Negative for dysuria, urgency, frequency, hematuria, flank pain, vaginal bleeding, vaginal discharge, difficulty urinating, vaginal pain and pelvic pain.  Musculoskeletal: Negative for joint swelling, arthralgias and gait problem.  Skin: Negative for rash.  Neurological: Negative for dizziness, syncope, speech difficulty, weakness, numbness and headaches.  Hematological: Negative for adenopathy.  Psychiatric/Behavioral: Negative for behavioral problems, dysphoric mood and agitation. The patient is not nervous/anxious.        Objective:   Physical Exam  Constitutional: She is oriented to person, place, and time. She appears well-developed and well-nourished.  Repeat blood pressure still 130/90  HENT:  Head: Normocephalic.  Right Ear: External ear normal.  Left Ear: External ear normal.  Mouth/Throat: Oropharynx is clear and moist.  Eyes: Conjunctivae are normal.    Neck: No thyromegaly present.  Cardiovascular: Normal rate, regular rhythm, normal heart sounds and intact distal pulses.   Pulmonary/Chest: Effort normal and breath sounds normal.  Abdominal: She exhibits no mass. There is no tenderness.  Musculoskeletal: She exhibits no edema.  Lymphadenopathy:    She has no cervical adenopathy.  Neurological: She is alert and oriented to person, place, and time.  Skin: Skin is warm and dry. No rash noted.  Psychiatric: She has a normal mood and affect. Her behavior is normal.          Assessment & Plan:   Hypertension.  Probably, well-controlled with excellent home readings Solitary kidney with mild renal insufficiency.  Last renal indices stable and improved.  She is followed by nephrology  Continue home blood pressure monitoring Low-salt diet Return in 9 months for her annual exam

## 2015-06-16 ENCOUNTER — Other Ambulatory Visit: Payer: Self-pay | Admitting: Gynecology

## 2015-06-16 DIAGNOSIS — Z1231 Encounter for screening mammogram for malignant neoplasm of breast: Secondary | ICD-10-CM

## 2015-06-24 ENCOUNTER — Ambulatory Visit (HOSPITAL_COMMUNITY): Payer: 59

## 2015-06-24 ENCOUNTER — Ambulatory Visit (HOSPITAL_COMMUNITY)
Admission: RE | Admit: 2015-06-24 | Discharge: 2015-06-24 | Disposition: A | Discharge status: Home / Self Care | Payer: 59 | Source: Ambulatory Visit | Attending: Gynecology | Admitting: Gynecology

## 2015-06-24 DIAGNOSIS — Z1231 Encounter for screening mammogram for malignant neoplasm of breast: Secondary | ICD-10-CM | POA: Diagnosis present

## 2015-07-01 ENCOUNTER — Other Ambulatory Visit: Payer: Self-pay | Admitting: Internal Medicine

## 2015-07-10 NOTE — Telephone Encounter (Signed)
Patient called about the below re-fill request.  She is completely out and did not take it yesterday.  Can medication please be filled today for patient?

## 2015-09-02 ENCOUNTER — Other Ambulatory Visit: Payer: Self-pay | Admitting: Internal Medicine

## 2015-12-03 ENCOUNTER — Other Ambulatory Visit: Payer: Self-pay | Admitting: Internal Medicine

## 2015-12-28 ENCOUNTER — Other Ambulatory Visit: Payer: Self-pay | Admitting: Internal Medicine

## 2016-02-08 ENCOUNTER — Ambulatory Visit (INDEPENDENT_AMBULATORY_CARE_PROVIDER_SITE_OTHER): Payer: BLUE CROSS/BLUE SHIELD | Admitting: Endocrinology

## 2016-02-08 ENCOUNTER — Encounter: Payer: Self-pay | Admitting: Endocrinology

## 2016-02-08 VITALS — BP 143/84 | HR 70 | Temp 98.1°F | Ht 63.0 in | Wt 190.0 lb

## 2016-02-08 DIAGNOSIS — E042 Nontoxic multinodular goiter: Secondary | ICD-10-CM

## 2016-02-08 LAB — TSH: TSH: 0.89 u[IU]/mL (ref 0.35–4.50)

## 2016-02-08 NOTE — Progress Notes (Signed)
Subjective:    Patient ID: Cynthia Berger, female    DOB: 1956/10/05, 60 y.o.   MRN: [id number]  HPI Pt returns for f/u of multinodular goiter (dx'ed 2013; bx was low-risk; she has been euthyroid).  She does not notice the goiter.  pt states she feels well in general, except for fatigue.   Past Medical History  Diagnosis Date  . LGSIL (low grade squamous intraepithelial dysplasia) 2013     C&B Inadequate ECC--positive LGSIL --  . Vitamin D deficiency 11/2007    LOW AT 17  . AC (acromioclavicular) joint bone spurs     HEEL BONE SPURS-SURGERY 12/2010 LEFT FOOT  . Antral gastritis   . Positive H. pylori test   . Uterine fibroid   . GERD (gastroesophageal reflux disease)   . Hypertension   . H/O hiatal hernia   . PMB (postmenopausal bleeding)   . Heart palpitations   . DDD (degenerative disc disease), cervical   . History of kidney cancer 2005    S/P RIGHT NEPHRECTOMY   . Multinodular goiter     RIGHT LOBE-- PER BX NEGATIVE  . Hiatal hernia   . Esophageal dysmotility     Past Surgical History  Procedure Laterality Date  . Cesarean section  1993    W/ TUBAL LIGATION, BILATERAL  . Nephrectomy  2005    RIGHT KIDNEY CANCER  . Rotator cuff repair  2010    RIGHT  . Foot surgery  2012    left heel spur removed  . Transthoracic echocardiogram  04-10-2012  DR BRIEN CRENSHAW    LVSF MILDLY REDUCED/ EF 45-50%  . Hysteroscopy w/d&c  06/08/2012    Procedure: DILATATION AND CURETTAGE /HYSTEROSCOPY;  Surgeon: Anastasio Auerbach, MD;  Location: [facility];  Service: Gynecology;  Laterality: N/A;  WITH RESECTION OF MYOMA  . Cervical biopsy  w/ loop electrode excision  10/2012    for persistent low-grade dysplasia  . Tubal ligation    . Esophogeal dilatation    . Salpingoophorectomy Bilateral 05/28/2013    Procedure: BILATERAL SALPINGO OOPHORECTOMY;  Surgeon: Anastasio Auerbach, MD;  Location: [facility];  Service: Gynecology;  Laterality: Bilateral;  . Abdominal hysterectomy  N/A 05/28/2013    Procedure: HYSTERECTOMY ABDOMINAL;  Surgeon: Anastasio Auerbach, MD;  Location: [facility];  Service: Gynecology;  Laterality: N/A;  . Cystoscopy N/A 05/28/2013    Procedure: CYSTOSCOPY;  Surgeon: Anastasio Auerbach, MD;  Location: [facility];  Service: Gynecology;  Laterality: N/A;  . Biopsy thyroid Right 2014    ENLARGED THYROID NO MEDS BENIGN  . S/p right nephrectomy Right     Social History   Social History  . Marital Status: Widowed    Spouse Name: N/A  . Number of Children: 4  . Years of Education: N/A   Occupational History  . [employer]    Social History Main Topics  . Smoking status: Never Smoker   . Smokeless tobacco: Never Used  . Alcohol Use: 0.0 oz/week    0 Standard drinks or equivalent per week     Comment: occasional  . Drug Use: No  . Sexual Activity:    Partners: Male    Birth Control/ Protection: Post-menopausal     Comment: INTERCOURSE AGE 31, SEXUAL PARTNERS LESS THAN 5   Other Topics Concern  . Not on file   Social History Narrative    Current Outpatient Prescriptions on File Prior to Visit  Medication Sig Dispense Refill  . amLODipine (NORVASC)  5 MG tablet TAKE ONE TABLET BY MOUTH ONCE DAILY 90 tablet 0  . cholecalciferol (VITAMIN D) 1000 UNITS tablet Take 1,000 Units by mouth daily.    Marland Kitchen DEXILANT 60 MG capsule TAKE ONE CAPSULE BY MOUTH ONCE DAILY 90 capsule 0  . metoprolol succinate (TOPROL-XL) 25 MG 24 hr tablet TAKE ONE TABLET BY MOUTH ONCE DAILY 30 tablet 0  . Multiple Vitamins-Minerals (MULTIVITAMIN PO) Take 1 tablet by mouth daily.     Marland Kitchen amLODipine (NORVASC) 2.5 MG tablet TAKE ONE TABLET BY MOUTH ONCE DAILY (Patient not taking: Reported on 02/08/2016) 90 tablet 0  . [DISCONTINUED] dicyclomine (BENTYL) 20 MG tablet Take 1 tablet (20 mg total) by mouth 2 (two) times daily. 40 tablet 0   No current facility-administered medications on file prior to visit.    Allergies  Allergen Reactions  . Penicillins Rash    Family History  Problem  Relation Age of Onset  . Hypertension Mother   . Hypertension Sister   . Hypertension Son   . Colon cancer Neg Hx   . Esophageal cancer Neg Hx   . Rectal cancer Neg Hx   . Stomach cancer Neg Hx     BP 143/84 mmHg  Pulse 70  Temp(Src) 98.1 F (36.7 C) (Oral)  Ht 5\' 3"  (1.6 m)  Wt 190 lb (86.183 kg)  BMI 33.67 kg/m2  SpO2 97%  LMP 08/23/2006  Review of Systems Denies sob.     Objective:   Physical Exam VITAL SIGNS:  See vs page GENERAL: no distress NECK: multinodular goiter, including the right nodule, is again easily palpable.    Lab Results  Component Value Date   TSH 0.89 02/08/2016      Assessment & Plan:  Multinodular goiter, euthyroid  Patient is advised the following: Patient Instructions  Let's recheck the ultrasound. you will receive a phone call, about a day and time for an appointment Please return in 1 year.   blood tests are requested for you today.  We'll let you know about the results.  most of the time, a "lumpy thyroid" will eventually become overactive.  this is usually a slow process, happening over the span of many years.

## 2016-02-08 NOTE — Patient Instructions (Addendum)
Let's recheck the ultrasound. you will receive a phone call, about a day and time for an appointment Please return in 1 year.   blood tests are requested for you today.  We'll let you know about the results.  most of the time, a "lumpy thyroid" will eventually become overactive.  this is usually a slow process, happening over the span of many years.

## 2016-02-09 ENCOUNTER — Encounter: Payer: 59 | Admitting: Women's Health

## 2016-02-10 ENCOUNTER — Other Ambulatory Visit: Payer: Self-pay | Admitting: Internal Medicine

## 2016-02-11 ENCOUNTER — Ambulatory Visit
Admission: RE | Admit: 2016-02-11 | Discharge: 2016-02-11 | Disposition: A | Discharge status: Home / Self Care | Payer: BLUE CROSS/BLUE SHIELD | Source: Ambulatory Visit | Attending: Endocrinology | Admitting: Endocrinology

## 2016-02-11 DIAGNOSIS — E042 Nontoxic multinodular goiter: Secondary | ICD-10-CM

## 2016-02-16 ENCOUNTER — Encounter: Payer: 59 | Admitting: Women's Health

## 2016-02-23 ENCOUNTER — Encounter: Payer: Self-pay | Admitting: Women's Health

## 2016-02-23 ENCOUNTER — Ambulatory Visit (INDEPENDENT_AMBULATORY_CARE_PROVIDER_SITE_OTHER): Payer: BLUE CROSS/BLUE SHIELD | Admitting: Women's Health

## 2016-02-23 VITALS — BP 148/80 | Ht 63.0 in | Wt 189.0 lb

## 2016-02-23 DIAGNOSIS — Z85528 Personal history of other malignant neoplasm of kidney: Secondary | ICD-10-CM | POA: Diagnosis not present

## 2016-02-23 DIAGNOSIS — Z01419 Encounter for gynecological examination (general) (routine) without abnormal findings: Secondary | ICD-10-CM

## 2016-02-23 NOTE — Progress Notes (Signed)
EMIKO TALLERICO 1956-10-18 [id number]    History:    Presents for annual exam.  2014 TVH with BSO for persistent LGSIL with positive high risk HPV. Normal mammogram history. 2008 negative colonoscopy. 2015 normal DEXA. Hypertension primary care 2005 renal cancer, encapsulated no further treatment. History of thyroid nodules benign endocrinologist manages. Has lost 10 pounds in the past year, weight was up about 40 pounds in the past 5 years.  Past medical history, past surgical history, family history and social history were all reviewed and documented in the EPIC chart. Works at [employer]. 4 children all doing well, 6 grandchildren. Goes on an annual cruise with friends.  ROS:  A ROS was performed and pertinent positives and negatives are included.  Exam:  Filed Vitals:   02/23/16 1143  BP: 148/80    General appearance:  Normal Thyroid:  Symmetrical, normal in size, without palpable masses or nodularity. Respiratory  Auscultation:  Clear without wheezing or rhonchi Cardiovascular  Auscultation:  Regular rate, without rubs, murmurs or gallops  Edema/varicosities:  Not grossly evident Abdominal  Soft,nontender, without masses, guarding or rebound.  Liver/spleen:  No organomegaly noted  Hernia:  None appreciated  Skin  Inspection:  Grossly normal   Breasts: Examined lying and sitting.     Right: Without masses, retractions, discharge or axillary adenopathy.     Left: Without masses, retractions, discharge or axillary adenopathy. Gentitourinary   Inguinal/mons:  Normal without inguinal adenopathy  External genitalia:  Normal  BUS/Urethra/Skene's glands:  Normal  Vagina:  Normal  Cervix: and uterus absent  Adnexa/parametria:     Rt: Without masses or tenderness.   Lt: Without masses or tenderness.  Anus and perineum: Normal  Digital rectal exam: Normal sphincter tone without palpated masses or tenderness  Assessment/Plan:  60 y.o. MBF G4 P4 for annual exam with no  complaints.  2014 TVH with BSO for persistent LGSIL with positive HR HPV normal Paps after on no HRT Hypertension-primary care manages labs and meds Thyroid nodules-endocrinologist manages 2005 renal cancer  Plan: SBE's, continue annual screening mammogram, calcium rich diet, vitamin D 2000 daily encouraged. Reviewed importance of regular exercise, fall prevention, and continue to cut calories for continued weight loss.  UA, Pap.    Huel Cote [facility], 12:24 PM 02/23/2016

## 2016-02-23 NOTE — Patient Instructions (Signed)

## 2016-02-24 LAB — URINALYSIS W MICROSCOPIC + REFLEX CULTURE
Bacteria, UA: NONE SEEN [HPF]
Bilirubin Urine: NEGATIVE
Casts: NONE SEEN [LPF]
Crystals: NONE SEEN [HPF]
GLUCOSE, UA: NEGATIVE
HGB URINE DIPSTICK: NEGATIVE
Ketones, ur: NEGATIVE
LEUKOCYTES UA: NEGATIVE
NITRITE: NEGATIVE
PH: 6 (ref 5.0–8.0)
Protein, ur: NEGATIVE
SQUAMOUS EPITHELIAL / LPF: NONE SEEN [HPF] (ref <=5)
Specific Gravity, Urine: 1.019 (ref 1.001–1.035)
WBC, UA: NONE SEEN WBC/HPF (ref <=5)
YEAST: NONE SEEN [HPF]

## 2016-02-24 LAB — PAP IG W/ RFLX HPV ASCU

## 2016-02-27 LAB — URINE CULTURE

## 2016-03-17 ENCOUNTER — Ambulatory Visit: Payer: BLUE CROSS/BLUE SHIELD | Admitting: Family Medicine

## 2016-03-18 ENCOUNTER — Ambulatory Visit: Payer: BLUE CROSS/BLUE SHIELD | Admitting: Family Medicine

## 2016-03-22 ENCOUNTER — Other Ambulatory Visit: Payer: Self-pay | Admitting: Internal Medicine

## 2016-03-24 ENCOUNTER — Encounter: Payer: Self-pay | Admitting: Family Medicine

## 2016-03-24 ENCOUNTER — Ambulatory Visit (INDEPENDENT_AMBULATORY_CARE_PROVIDER_SITE_OTHER): Payer: BLUE CROSS/BLUE SHIELD | Admitting: Family Medicine

## 2016-03-24 VITALS — BP 150/90 | HR 72 | Temp 98.0°F | Ht 63.0 in | Wt 190.8 lb

## 2016-03-24 DIAGNOSIS — I10 Essential (primary) hypertension: Secondary | ICD-10-CM | POA: Diagnosis not present

## 2016-03-24 DIAGNOSIS — E875 Hyperkalemia: Secondary | ICD-10-CM

## 2016-03-24 DIAGNOSIS — M791 Myalgia: Secondary | ICD-10-CM | POA: Diagnosis not present

## 2016-03-24 DIAGNOSIS — M7918 Myalgia, other site: Secondary | ICD-10-CM

## 2016-03-24 DIAGNOSIS — E559 Vitamin D deficiency, unspecified: Secondary | ICD-10-CM | POA: Diagnosis not present

## 2016-03-24 DIAGNOSIS — K219 Gastro-esophageal reflux disease without esophagitis: Secondary | ICD-10-CM

## 2016-03-24 DIAGNOSIS — Z6833 Body mass index (BMI) 33.0-33.9, adult: Secondary | ICD-10-CM

## 2016-03-24 LAB — MICROALBUMIN / CREATININE URINE RATIO
CREATININE, U: 54.3 mg/dL
Microalb Creat Ratio: 1.3 mg/g (ref 0.0–30.0)
Microalb, Ur: <0.7 mg/dL (ref 0.0–1.9)

## 2016-03-24 LAB — BASIC METABOLIC PANEL
BUN: 12 mg/dL (ref 6–23)
CALCIUM: 9.9 mg/dL (ref 8.4–10.5)
CHLORIDE: 104 meq/L (ref 96–112)
CO2: 28 meq/L (ref 19–32)
CREATININE: 1.2 mg/dL (ref 0.40–1.20)
GFR: 59.16 mL/min — ABNORMAL LOW (ref >60.00)
GLUCOSE: 87 mg/dL (ref 70–99)
Potassium: 5.6 mEq/L — ABNORMAL HIGH (ref 3.5–5.1)
Sodium: 138 mEq/L (ref 135–145)

## 2016-03-24 LAB — VITAMIN D 25 HYDROXY (VIT D DEFICIENCY, FRACTURES): VITD: 35.13 ng/mL (ref 30.00–100.00)

## 2016-03-24 MED ORDER — AMLODIPINE BESYLATE 5 MG PO TABS: 5.0000 mg | ORAL_TABLET | Freq: Every day | ORAL | Status: DC

## 2016-03-24 MED ORDER — METOPROLOL SUCCINATE ER 25 MG PO TB24: 25.0000 mg | ORAL_TABLET | Freq: Every day | ORAL | Status: DC

## 2016-03-24 NOTE — Patient Instructions (Addendum)
DASH diet recommended: high in vegetables, fruits, low-fat dairy products, whole grains, poultry, fish, and nuts; and low in sweets, sugar-sweetened beverages, and red meats.  Blood pressure goal < 140/90. Elevated blood pressure increases the risk of strokes, heart and kidney disease, and eye problems.   Regular physical activity: 150 min per week of moderate activity or 75 min per week if intense physical activity. Daily brisk walking for 15-30 min is another option. Calorie count (1800 cal/day or less).  Weight Watchers is a good option for diet.  -Left side pain seems musculoskeletal, OTC topical icy hot may help, Acetaminophen if needed. Follow if needed.

## 2016-03-24 NOTE — Progress Notes (Addendum)
Subjective:    Patient ID: Cynthia Berger, female    DOB: [birth year], 60 y.o.   MRN: [id number]  HPI   Ms.  Cynthia Berger Media is a 60 y.o.female here today to establish care with me and follow on HTN.  Years Hx of HTN, currently she is on Amlodipine 5 mg daily and Metoprolol Succinate 25 mg daily. Tolerating medications well, did not take medications today. She is not xercising and following a healthy diet consistently except for low salt diet. Home BP's 120's/80's -Her e GFR has been slightly under normal range, she has not noted foam in urine or decreased urine output.  -She is also c/o left lateral thoracic aching like pain, it is mild, intermittently and has had it since hysterectomy., around a year ago. Pain is stable, no radiated, no associated numbness or tingling, no skin rash, it does not limit her daily activities. It is exacerbated by palpation and alleviated spontaneously. She doe snot need to take OTC analgesic for this pain. Recently she saw urologists and had abdominal CT done, she thinks it was abdominal and thoracic; to follow on right nephrectomy due to renal cancer, reported as negative.  -GERD: Takes Dexilant 60 mg daily as needed.Symptoms well controled with diet, avoids food that trigger symptoms. -Vit D deficiency: Currently she is on OTC Vit D 1000 U daily.   25 OH vit D 08/06/2014 Vit D: 27.  Lab Results  Component Value Date   CREATININE 1.23* 02/27/2015   BUN 16 02/27/2015   NA 135 02/27/2015   K 4.6 02/27/2015   CL 104 02/27/2015   CO2 28 02/27/2015     Lab Results  Component Value Date   CHOL 158 02/27/2015   HDL 55.20 02/27/2015   LDLCALC 92 02/27/2015   TRIG 56.0 02/27/2015   CHOLHDL 3 02/27/2015    Review of Systems  Constitutional: Negative for fever, activity change, appetite change, fatigue and unexpected weight change.  HENT: Negative for mouth sores, nosebleeds and trouble swallowing.   Eyes: Negative for redness and visual  disturbance.  Respiratory: Negative for cough, shortness of breath and wheezing.   Cardiovascular: Negative for chest pain, palpitations and leg swelling.  Gastrointestinal: Negative for nausea, vomiting and abdominal pain.       Negative for changes in bowel habits.  Genitourinary: Negative for dysuria, hematuria, decreased urine volume and difficulty urinating.  Musculoskeletal: Positive for back pain (left lateral (posterior rib cage)).  Neurological: Negative for seizures, syncope, weakness, numbness and headaches.  Psychiatric/Behavioral: Negative.    Current Outpatient Prescriptions on File Prior to Visit  Medication Sig Dispense Refill  . cholecalciferol (VITAMIN D) 1000 UNITS tablet Take 1,000 Units by mouth daily.    Marland Kitchen DEXILANT 60 MG capsule TAKE ONE CAPSULE BY MOUTH ONCE DAILY 90 capsule 0  . Multiple Vitamins-Minerals (MULTIVITAMIN PO) Take 1 tablet by mouth daily.     . [DISCONTINUED] dicyclomine (BENTYL) 20 MG tablet Take 1 tablet (20 mg total) by mouth 2 (two) times daily. 40 tablet 0   No current facility-administered medications on file prior to visit.     Past Medical History  Diagnosis Date  . LGSIL (low grade squamous intraepithelial dysplasia) 2013     C&B Inadequate ECC--positive LGSIL --  . Vitamin D deficiency 11/2007    LOW AT 17  . AC (acromioclavicular) joint bone spurs     HEEL BONE SPURS-SURGERY 12/2010 LEFT FOOT  . Antral gastritis   . Positive H. pylori test   .  GERD (gastroesophageal reflux disease)   . Hypertension   . Heart palpitations   . DDD (degenerative disc disease), cervical   . Multinodular goiter     RIGHT LOBE-- PER BX NEGATIVE  . Hiatal hernia   . Esophageal dysmotility     Social History   Social History  . Marital Status: Widowed    Spouse Name: N/A  . Number of Children: 4  . Years of Education: N/A   Occupational History  . [employer]    Social History Main Topics  . Smoking status: Never Smoker   . Smokeless tobacco:  Never Used  . Alcohol Use: 0.0 oz/week    0 Standard drinks or equivalent per week     Comment: occasional  . Drug Use: No  . Sexual Activity:    Partners: Male    Birth Control/ Protection: Post-menopausal     Comment: INTERCOURSE AGE 41, SEXUAL PARTNERS LESS THAN 5   Other Topics Concern  . None   Social History Narrative     Filed Vitals:   03/24/16 0949  BP: 150/90  Pulse: 72  Temp: 98 F (36.7 C)   Body mass index is 33.81 kg/(m^2).      Objective:   Physical Exam  Constitutional: She is oriented to person, place, and time. She appears well-developed and well-nourished. No distress.  HENT:  Head: Normocephalic and atraumatic.  Mouth/Throat: Oropharynx is clear and moist.  Eyes: Conjunctivae and EOM are normal. Pupils are equal, round, and reactive to light.  Neck: Neck supple. Thyromegaly present. No thyroid mass present.  Cardiovascular: Normal rate and regular rhythm.   No murmur heard. Pulses:      Dorsalis pedis pulses are 2+ on the right side, and 2+ on the left side.  Trace pitting edema LE bilateral.  Pulmonary/Chest: Effort normal and breath sounds normal. She has no wheezes. She has no rales. She exhibits no tenderness.  Abdominal: Soft. She exhibits no mass. There is no tenderness.  Musculoskeletal: She exhibits no edema.  Mild tenderness upon palpation of left lateral rib cage and back muscles, no deformity appreciated.  Lymphadenopathy:    She has no cervical adenopathy.  Neurological: She is alert and oriented to person, place, and time. Coordination normal.  Psychiatric: She has a normal mood and affect.  Nursing note and vitals reviewed.      Assessment & Plan:   Klea was seen today for establish care.  Diagnoses and all orders for this visit:  Vitamin D deficiency No changes in current management, will follow lab result and adjust medication accordingly.  -     VITAMIN D 25 Hydroxy (Vit-D Deficiency, Fractures)  Essential  hypertension Adequately controlled based on home BP readings report. Re-checked 140/80. No changes in current management. DASH diet recommended. Eye exam recommended annually. F/U in 6 months.  -     Basic metabolic panel -     Microalbumin / creatinine urine ratio -     amLODipine (NORVASC) 5 MG tablet; Take 1 tablet (5 mg total) by mouth daily. -     metoprolol succinate (TOPROL-XL) 25 MG 24 hr tablet; Take 1 tablet (25 mg total) by mouth daily.  Gastroesophageal reflux disease without esophagitis Stable, no changes in current management. GERD precautions and some PPI side effects discussed. F/U annually, before if needed.  Musculoskeletal pain Seems muscle related, since she had a recent abdominal CT and reported as negative she was reassured.Monitor for changes, Acetaminophen if needed, and topical OTC pain  meds.  BMI 33.0-33.9,adult We discussed benefits of wt loss. Consistency with healthy diet and physical activity recommended. Weight Watchers is a good option as well as daily brisk walking for 15-30 min as tolerated.  Lab Results  Component Value Date   CREATININE 1.20 03/24/2016   BUN 12 03/24/2016   NA 138 03/24/2016   K 5.6* 03/24/2016   CL 104 03/24/2016   CO2 28 03/24/2016    Lab Results  Component Value Date   MICROALBUR <0.7 03/24/2016      Betty G. Martinique, MD  [facility]. [place] office.

## 2016-03-24 NOTE — Progress Notes (Signed)
Pre visit review using our clinic review tool, if applicable. No additional management support is needed unless otherwise documented below in the visit note. 

## 2016-04-19 DIAGNOSIS — C641 Malignant neoplasm of right kidney, except renal pelvis: Secondary | ICD-10-CM | POA: Diagnosis not present

## 2016-05-05 ENCOUNTER — Other Ambulatory Visit: Payer: Self-pay

## 2016-05-05 DIAGNOSIS — Z1231 Encounter for screening mammogram for malignant neoplasm of breast: Secondary | ICD-10-CM

## 2016-06-27 ENCOUNTER — Ambulatory Visit
Admission: RE | Admit: 2016-06-27 | Discharge: 2016-06-27 | Disposition: A | Discharge status: Home / Self Care | Payer: BLUE CROSS/BLUE SHIELD | Source: Ambulatory Visit

## 2016-06-27 DIAGNOSIS — Z1231 Encounter for screening mammogram for malignant neoplasm of breast: Secondary | ICD-10-CM

## 2016-09-23 ENCOUNTER — Ambulatory Visit: Payer: BLUE CROSS/BLUE SHIELD | Admitting: Family Medicine

## 2016-10-25 DIAGNOSIS — C641 Malignant neoplasm of right kidney, except renal pelvis: Secondary | ICD-10-CM | POA: Diagnosis not present

## 2016-11-28 ENCOUNTER — Encounter: Payer: Self-pay | Admitting: Family Medicine

## 2016-11-28 ENCOUNTER — Ambulatory Visit (INDEPENDENT_AMBULATORY_CARE_PROVIDER_SITE_OTHER)
Admission: RE | Admit: 2016-11-28 | Discharge: 2016-11-28 | Disposition: A | Discharge status: Home / Self Care | Payer: BLUE CROSS/BLUE SHIELD | Source: Ambulatory Visit | Attending: Family Medicine | Admitting: Family Medicine

## 2016-11-28 ENCOUNTER — Ambulatory Visit (INDEPENDENT_AMBULATORY_CARE_PROVIDER_SITE_OTHER): Payer: BLUE CROSS/BLUE SHIELD | Admitting: Family Medicine

## 2016-11-28 VITALS — BP 122/76 | HR 71 | Temp 98.0°F | Resp 12 | Ht 63.0 in | Wt 176.2 lb

## 2016-11-28 DIAGNOSIS — R0781 Pleurodynia: Secondary | ICD-10-CM | POA: Diagnosis not present

## 2016-11-28 NOTE — Progress Notes (Signed)
Pre visit review using our clinic review tool, if applicable. No additional management support is needed unless otherwise documented below in the visit note. 

## 2016-11-28 NOTE — Progress Notes (Signed)
HPI:  ACUTE VISIT:  Chief Complaint  Patient presents with  . Acute Visit    [name] is a 60 y.o. female, who is here today complaining of left rib pain noted about a week ago. She does not remember why she was palpating area but when she did so she noted soreness, no masses, or skin changes.  Pain is a couple cms under left breast.  She does  Not recall any injury or unusual physical activity that can explain pain. Pain seems to be exacerbated by left shoulder movement or certain movements. Alleviating factors no identified.   No changes since she first note it. She has not tried OTC medications.  She has not noted cough, wheezing, chest pain, or dyspnea. No tobacco use.  Mammogram 06/2016 Birads 1. No breast tenderness, masses, or nipple discharge.     Review of Systems  Constitutional: Negative for chills, fatigue, fever and unexpected weight change.  HENT: Negative for mouth sores, sore throat and trouble swallowing.   Respiratory: Negative for cough, shortness of breath and wheezing.   Cardiovascular: Negative for chest pain and palpitations.  Gastrointestinal: Negative for abdominal pain.       No changes in bowel habits.  Musculoskeletal: Negative for arthralgias and back pain.  Skin: Negative for color change and rash.  Neurological: Negative for weakness and numbness.  Hematological: Negative for adenopathy. Does not bruise/bleed easily.      Current Outpatient Prescriptions on File Prior to Visit  Medication Sig Dispense Refill  . amLODipine (NORVASC) 5 MG tablet Take 1 tablet (5 mg total) by mouth daily. 90 tablet 2  . cholecalciferol (VITAMIN D) 1000 UNITS tablet Take 1,000 Units by mouth daily.    Marland Kitchen DEXILANT 60 MG capsule TAKE ONE CAPSULE BY MOUTH ONCE DAILY 90 capsule 0  . metoprolol succinate (TOPROL-XL) 25 MG 24 hr tablet Take 1 tablet (25 mg total) by mouth daily. 90 tablet 2  . Multiple Vitamins-Minerals (MULTIVITAMIN PO) Take 1  tablet by mouth daily.     . [DISCONTINUED] dicyclomine (BENTYL) 20 MG tablet Take 1 tablet (20 mg total) by mouth 2 (two) times daily. 40 tablet 0   No current facility-administered medications on file prior to visit.      Past Medical History:  Diagnosis Date  . AC (acromioclavicular) joint bone spurs    HEEL BONE SPURS-SURGERY 12/2010 LEFT FOOT  . Antral gastritis   . DDD (degenerative disc disease), cervical   . Esophageal dysmotility   . GERD (gastroesophageal reflux disease)   . Heart palpitations   . Hiatal hernia   . Hypertension   . LGSIL (low grade squamous intraepithelial dysplasia) 2013    Cynthia Berger--positive LGSIL --  . Multinodular goiter    RIGHT LOBE-- PER BX NEGATIVE  . Positive H. pylori test   . Vitamin D deficiency 11/2007   LOW AT 17   Allergies  Allergen Reactions  . Penicillins Rash    Social History   Social History  . Marital status: Widowed    Spouse name: N/A  . Number of children: 4  . Years of education: N/A   Occupational History  . Cynthia Berger   Social History Main Topics  . Smoking status: Never Smoker  . Smokeless tobacco: Never Used  . Alcohol use 0.0 oz/week     Comment: occasional  . Drug use: No  . Sexual activity: Yes    Partners: Male  Birth control/ protection: Post-menopausal     Comment: INTERCOURSE AGE 44, SEXUAL PARTNERS LESS THAN 5   Other Topics Concern  . None   Social History Narrative  . None    Vitals:   11/28/16 1420  BP: 122/76  Pulse: 71  Resp: 12  Temp: 98 F (36.7 C)   Body mass index is 31.21 kg/m.      Physical Exam  Nursing note and vitals reviewed. Constitutional: She appears well-nourished. No distress.  HENT:  Head: Atraumatic.  Eyes: Conjunctivae are normal.  Respiratory: Effort normal and breath sounds normal. She exhibits no tenderness.    GI: Soft. She exhibits no mass. There is no tenderness.  Genitourinary: No breast swelling or  tenderness.  Genitourinary Comments: Breast no masses, tenderness, or nipple discharge, bilateral.  Musculoskeletal: She exhibits no edema.  Tenderness upon palpation of 7th rib, left, on anterior axillary line. No deformity, no masses palpated. See graph chest wall for localization.  Lymphadenopathy:    She has no cervical adenopathy.    She has no axillary adenopathy.  Skin: Skin is warm. No ecchymosis and no rash noted. No erythema.  Psychiatric: She has a normal mood and affect.  Well groomed, good eye contact.      ASSESSMENT AND PLAN:     Cynthia was seen today for acute visit.  Diagnoses and all orders for this visit:  Costal margin pain -     DG Ribs Unilateral Left; Future   Seems musculoskeletal pain. We discussed other possible causes. Rib imaging ordered. Instructed about warning signs. Monitor for new symptoms or worsening pain. F/U in 2-3 months if needed.     Return if symptoms worsen or fail to improve.     -[name] was advised to return or notify a doctor immediately if symptoms worsen or persist or new concerns arise.       Cynthia Berger G. Martinique, MD  [facility]. [place] office.

## 2016-11-28 NOTE — Patient Instructions (Signed)
  [name] Media I have seen you today for an acute visit.  1. Costal margin pain Most likely musculoskeletal.  X [place] to see if any bone lesion, will monitor and if still present in 2-3 months or gets worse please follow.   - DG Ribs Unilateral Left; Future    In general please monitor for signs of worsening symptoms and seek immediate medical attention if any concerning/warning symptom.

## 2016-11-29 ENCOUNTER — Telehealth: Payer: Self-pay | Admitting: Family Medicine

## 2016-11-29 NOTE — Telephone Encounter (Signed)
Pt would like xray results. 

## 2016-11-29 NOTE — Telephone Encounter (Signed)
Left voicemail for patient to call the office back.   

## 2016-11-29 NOTE — Telephone Encounter (Signed)
Informed patient of results and patient verbalized understanding.  

## 2017-02-07 ENCOUNTER — Ambulatory Visit: Payer: BLUE CROSS/BLUE SHIELD | Admitting: Endocrinology

## 2017-02-20 ENCOUNTER — Other Ambulatory Visit: Payer: Self-pay | Admitting: Family Medicine

## 2017-02-20 DIAGNOSIS — I10 Essential (primary) hypertension: Secondary | ICD-10-CM

## 2017-02-23 ENCOUNTER — Encounter: Payer: BLUE CROSS/BLUE SHIELD | Admitting: Women's Health

## 2017-02-27 ENCOUNTER — Ambulatory Visit: Payer: BLUE CROSS/BLUE SHIELD | Admitting: Endocrinology

## 2017-03-13 ENCOUNTER — Ambulatory Visit: Payer: BLUE CROSS/BLUE SHIELD | Admitting: Endocrinology

## 2017-03-20 ENCOUNTER — Encounter: Payer: BLUE CROSS/BLUE SHIELD | Admitting: Women's Health

## 2017-03-25 ENCOUNTER — Other Ambulatory Visit: Payer: Self-pay | Admitting: Family Medicine

## 2017-03-25 DIAGNOSIS — I10 Essential (primary) hypertension: Secondary | ICD-10-CM

## 2017-07-05 ENCOUNTER — Other Ambulatory Visit: Payer: Self-pay | Admitting: Family Medicine

## 2017-07-05 DIAGNOSIS — I10 Essential (primary) hypertension: Secondary | ICD-10-CM

## 2017-10-09 ENCOUNTER — Other Ambulatory Visit: Payer: Self-pay | Admitting: Family Medicine

## 2017-10-09 DIAGNOSIS — I10 Essential (primary) hypertension: Secondary | ICD-10-CM

## 2017-10-16 ENCOUNTER — Other Ambulatory Visit: Payer: Self-pay | Admitting: Women's Health

## 2017-10-16 DIAGNOSIS — Z1231 Encounter for screening mammogram for malignant neoplasm of breast: Secondary | ICD-10-CM

## 2017-10-23 ENCOUNTER — Encounter: Payer: Self-pay | Admitting: Endocrinology

## 2017-10-23 ENCOUNTER — Ambulatory Visit: Payer: BLUE CROSS/BLUE SHIELD | Admitting: Endocrinology

## 2017-10-23 VITALS — BP 134/86 | HR 58 | Wt 175.8 lb

## 2017-10-23 DIAGNOSIS — E042 Nontoxic multinodular goiter: Secondary | ICD-10-CM | POA: Diagnosis not present

## 2017-10-23 LAB — TSH: TSH: 0.63 u[IU]/mL (ref 0.35–4.50)

## 2017-10-23 NOTE — Progress Notes (Signed)
Subjective:    Patient ID: Cynthia Berger, female    DOB: [birth year], 61 y.o.   MRN: [id number]  HPI Pt returns for f/u of small multinodular goiter (dx'ed 2013; bx was low-risk; she has been euthyroid off rx; f/u US in 2017 was unchanged, and did not meet criteria for bx).  She does not notice the goiter.  pt states she feels well in general, except for fatigue. Past Medical History:  Diagnosis Date  . AC (acromioclavicular) joint bone spurs    HEEL BONE SPURS-SURGERY 12/2010 LEFT FOOT  . Antral gastritis   . DDD (degenerative disc disease), cervical   . Esophageal dysmotility   . GERD (gastroesophageal reflux disease)   . Heart palpitations   . Hiatal hernia   . Hypertension   . LGSIL (low grade squamous intraepithelial dysplasia) 2013    C&B Inadequate ECC--positive LGSIL --  . Multinodular goiter    RIGHT LOBE-- PER BX NEGATIVE  . Positive H. pylori test   . Vitamin D deficiency 11/2007   LOW AT 18    Past Surgical History:  Procedure Laterality Date  . BIOPSY THYROID Right 2014   ENLARGED THYROID NO MEDS BENIGN  . CERVICAL BIOPSY  W/ LOOP ELECTRODE EXCISION  10/2012   for persistent low-grade dysplasia  . CESAREAN SECTION  1993   W/ TUBAL LIGATION, BILATERAL  . esophogeal dilatation    . FOOT SURGERY  2012   left heel spur removed  . NEPHRECTOMY  2005   RIGHT KIDNEY CANCER  . ROTATOR CUFF REPAIR  2010   RIGHT  . s/p Right nephrectomy Right   . TRANSTHORACIC ECHOCARDIOGRAM  04-10-2012  DR BRIEN CRENSHAW   LVSF MILDLY REDUCED/ EF 45-50%  . TUBAL LIGATION      Social History   Socioeconomic History  . Marital status: Widowed    Spouse name: Not on file  . Number of children: 4  . Years of education: Not on file  . Highest education level: Not on file  Social Needs  . Financial resource strain: Not on file  . Food insecurity - worry: Not on file  . Food insecurity - inability: Not on file  . Transportation needs - medical: Not on file  . Transportation  needs - non-medical: Not on file  Occupational History  . Occupation: [employer]    Employer: [employer]  Tobacco Use  . Smoking status: Never Smoker  . Smokeless tobacco: Never Used  Substance and Sexual Activity  . Alcohol use: Yes    Alcohol/week: 0.0 oz    Comment: occasional  . Drug use: No  . Sexual activity: Yes    Partners: Male    Birth control/protection: Post-menopausal    Comment: INTERCOURSE AGE 4, SEXUAL PARTNERS LESS THAN 5  Other Topics Concern  . Not on file  Social History Narrative  . Not on file    Current Outpatient Medications on File Prior to Visit  Medication Sig Dispense Refill  . amLODipine (NORVASC) 5 MG tablet TAKE 1 TABLET BY MOUTH ONCE DAILY 90 tablet 0  . cholecalciferol (VITAMIN D) 1000 UNITS tablet Take 1,000 Units by mouth daily.    Marland Kitchen DEXILANT 60 MG capsule TAKE ONE CAPSULE BY MOUTH ONCE DAILY 90 capsule 0  . metoprolol succinate (TOPROL-XL) 25 MG 24 hr tablet TAKE 1 TABLET BY MOUTH ONCE DAILY 90 tablet 1  . Multiple Vitamins-Minerals (MULTIVITAMIN PO) Take 1 tablet by mouth daily.     . [DISCONTINUED] dicyclomine (  BENTYL) 20 MG tablet Take 1 tablet (20 mg total) by mouth 2 (two) times daily. 40 tablet 0   No current facility-administered medications on file prior to visit.     Allergies  Allergen Reactions  . Penicillins Rash    Family History  Problem Relation Age of Onset  . Hypertension Mother   . Hypertension Sister   . Hypertension Son   . Colon cancer Neg Hx   . Esophageal cancer Neg Hx   . Rectal cancer Neg Hx   . Stomach cancer Neg Hx     BP 134/86 (BP Location: Left Arm, Patient Position: Sitting, Cuff Size: Normal)   Pulse (!) 58   Wt 175 lb 12.8 oz (79.7 kg)   LMP 08/23/2006   SpO2 98%   BMI 31.14 kg/m    Review of Systems Denies neck pain    Objective:   Physical Exam VITAL SIGNS:  See vs page GENERAL: no distress NECK: multinodular goiter, including the right nodule, is again easily palpable.          Assessment & Plan:  Multinodular goiter, clinically unchanged, but she s due for f/u TSH  Patient Instructions  blood tests are requested for you today.  We'll let you know about the results.  Please return in 1 year.   We can skip the ultrasound this year most of the time, a "lumpy thyroid" will eventually become overactive.  this is usually a slow process, happening over the span of many years.

## 2017-10-23 NOTE — Patient Instructions (Addendum)
blood tests are requested for you today.  We'll let you know about the results.  Please return in 1 year.   We can skip the ultrasound this year most of the time, a "lumpy thyroid" will eventually become overactive.  this is usually a slow process, happening over the span of many years.

## 2017-10-23 NOTE — Progress Notes (Signed)
HPI:   Cynthia Berger is a 61 y.o. female, who is here today for her routine physical.  Last CPE: 02/2015 She follows with gyn, last gyn preventive visit in 02/2016.  Regular exercise 3 or more time per week: Yes, she is doing "a lot of walking." Following a healthy diet: Yes She lives with her 73 yo daughter.  Chronic medical problems: Multinodular goiter (follows with Dr Loanne Drilling), vit D deficiency,HTN,GERD, renal cancer (s/p right nephrectomy) and DDD among some. She follows with urologist every 3 months.   Immunization History  Administered Date(s) Administered  . Tdap 06/09/2014    Mammogram: 06/2016 Birads 1. Has appt 11/07/17. Colonoscopy: 11/2007 DEXA: 01/2014 normal. Last eye exam 10/2016, she has an appt next month.  Hep C screening: Never done, she would like to have it add to her labs.  She has no concerns today.  Vit D deficiency, she is on Vit D3 1000 U daily.  HTN: She is taking Amlodipine 5 mg daily and Metoprolol Succinate 25 mg daily. Denies severe/frequent headache, visual changes, chest pain, dyspnea, palpitation, claudication, focal weakness, or edema.  CKD III, denies foam in urine,decreased urine output, or gross hematuria.   Review of Systems  Constitutional: Negative for appetite change, fatigue and fever.  HENT: Negative for hearing loss, mouth sores, sore throat, trouble swallowing and voice change.   Eyes: Negative for redness and visual disturbance.  Respiratory: Negative for cough, shortness of breath and wheezing.   Cardiovascular: Negative for chest pain and leg swelling.  Gastrointestinal: Negative for abdominal pain, blood in stool, nausea and vomiting.       No changes in bowel habits.  Endocrine: Negative for cold intolerance, heat intolerance, polydipsia, polyphagia and polyuria.  Genitourinary: Negative for decreased urine volume, dysuria, hematuria, vaginal bleeding and vaginal discharge.  Musculoskeletal: Positive for  arthralgias and back pain. Negative for gait problem and neck pain.  Skin: Negative for color change and rash.  Allergic/Immunologic: Negative for environmental allergies.  Neurological: Negative for seizures, syncope, weakness, numbness and headaches.  Hematological: Negative for adenopathy. Does not bruise/bleed easily.  Psychiatric/Behavioral: Negative for confusion and sleep disturbance. The patient is not nervous/anxious.   All other systems reviewed and are negative.     Current Outpatient Medications on File Prior to Visit  Medication Sig Dispense Refill  . cholecalciferol (VITAMIN D) 1000 UNITS tablet Take 1,000 Units by mouth daily.    Marland Kitchen DEXILANT 60 MG capsule TAKE ONE CAPSULE BY MOUTH ONCE DAILY 90 capsule 0  . Multiple Vitamins-Minerals (MULTIVITAMIN PO) Take 1 tablet by mouth daily.     . [DISCONTINUED] dicyclomine (BENTYL) 20 MG tablet Take 1 tablet (20 mg total) by mouth 2 (two) times daily. 40 tablet 0   No current facility-administered medications on file prior to visit.      Past Medical History:  Diagnosis Date  . AC (acromioclavicular) joint bone spurs    HEEL BONE SPURS-SURGERY 12/2010 LEFT FOOT  . Antral gastritis   . DDD (degenerative disc disease), cervical   . Esophageal dysmotility   . GERD (gastroesophageal reflux disease)   . Heart palpitations   . Hiatal hernia   . Hypertension   . LGSIL (low grade squamous intraepithelial dysplasia) 2013    C&B Inadequate ECC--positive LGSIL --  . Multinodular goiter    RIGHT LOBE-- PER BX NEGATIVE  . Positive H. pylori test   . Vitamin D deficiency 11/2007   LOW AT 18    Past  Surgical History:  Procedure Laterality Date  . ABDOMINAL HYSTERECTOMY N/A 05/28/2013   Procedure: HYSTERECTOMY ABDOMINAL;  Surgeon: Anastasio Auerbach, MD;  Location: [facility];  Service: Gynecology;  Laterality: N/A;  . BIOPSY THYROID Right 2014   ENLARGED THYROID NO MEDS BENIGN  . CERVICAL BIOPSY  W/ LOOP ELECTRODE EXCISION  10/2012    for persistent low-grade dysplasia  . CESAREAN SECTION  1993   W/ TUBAL LIGATION, BILATERAL  . CYSTOSCOPY N/A 05/28/2013   Procedure: CYSTOSCOPY;  Surgeon: Anastasio Auerbach, MD;  Location: [facility];  Service: Gynecology;  Laterality: N/A;  . esophogeal dilatation    . FOOT SURGERY  2012   left heel spur removed  . HYSTEROSCOPY W/D&C  06/08/2012   Procedure: DILATATION AND CURETTAGE /HYSTEROSCOPY;  Surgeon: Anastasio Auerbach, MD;  Location: [facility];  Service: Gynecology;  Laterality: N/A;  WITH RESECTION OF MYOMA  . NEPHRECTOMY  2005   RIGHT KIDNEY CANCER  . ROTATOR CUFF REPAIR  2010   RIGHT  . s/p Right nephrectomy Right   . SALPINGOOPHORECTOMY Bilateral 05/28/2013   Procedure: BILATERAL SALPINGO OOPHORECTOMY;  Surgeon: Anastasio Auerbach, MD;  Location: [facility];  Service: Gynecology;  Laterality: Bilateral;  . TRANSTHORACIC ECHOCARDIOGRAM  04-10-2012  DR BRIEN CRENSHAW   LVSF MILDLY REDUCED/ EF 45-50%  . TUBAL LIGATION      Allergies  Allergen Reactions  . Penicillins Rash    Family History  Problem Relation Age of Onset  . Hypertension Mother   . Hypertension Sister   . Hypertension Son   . Colon cancer Neg Hx   . Esophageal cancer Neg Hx   . Rectal cancer Neg Hx   . Stomach cancer Neg Hx     Social History   Socioeconomic History  . Marital status: Widowed    Spouse name: None  . Number of children: 4  . Years of education: None  . Highest education level: None  Social Needs  . Financial resource strain: None  . Food insecurity - worry: None  . Food insecurity - inability: None  . Transportation needs - medical: None  . Transportation needs - non-medical: None  Occupational History  . Occupation: [employer]    Employer: [employer]  Tobacco Use  . Smoking status: Never Smoker  . Smokeless tobacco: Never Used  Substance and Sexual Activity  . Alcohol use: Yes    Alcohol/week: 0.0 oz    Comment: occasional  . Drug use: No  .  Sexual activity: Yes    Partners: Male    Birth control/protection: Post-menopausal    Comment: INTERCOURSE AGE 42, SEXUAL PARTNERS LESS THAN 5  Other Topics Concern  . None  Social History Narrative  . None     Vitals:   10/24/17 0755  BP: 126/82  Pulse: 64  Resp: 12  Temp: 98.5 F (36.9 C)  SpO2: 98%   Body mass index is 31.42 kg/m.   Wt Readings from Last 3 Encounters:  10/24/17 177 lb 6 oz (80.5 kg)  10/23/17 175 lb 12.8 oz (79.7 kg)  11/28/16 176 lb 3.2 oz (79.9 kg)    Physical Exam  Nursing note and vitals reviewed. Constitutional: She is oriented to person, place, and time. She appears well-developed. No distress.  HENT:  Head: Normocephalic and atraumatic.  Right Ear: Hearing, tympanic membrane, external ear and ear canal normal.  Left Ear: Hearing, tympanic membrane, external ear and ear canal normal.  Mouth/Throat: Uvula is midline, oropharynx  is clear and moist and mucous membranes are normal.  Cerumen excess left, TM seen partially.  Eyes: Conjunctivae and EOM are normal. Pupils are equal, round, and reactive to light.  Neck: No tracheal deviation present. Thyromegaly present.  Cardiovascular: Normal rate and regular rhythm.  No murmur heard. Pulses:      Dorsalis pedis pulses are 2+ on the right side, and 2+ on the left side.  Respiratory: Effort normal and breath sounds normal. No respiratory distress.  GI: Soft. She exhibits no mass. There is no hepatomegaly. There is no tenderness.  Musculoskeletal: She exhibits no edema or tenderness.  No major deformity or sing of synovitis appreciated.  Lymphadenopathy:    She has no cervical adenopathy.       Right: No supraclavicular adenopathy present.       Left: No supraclavicular adenopathy present.  Neurological: She is alert and oriented to person, place, and time. She has normal strength. No cranial nerve deficit. Coordination and gait normal.  Reflex Scores:      Bicep reflexes are 2+ on the right  side and 2+ on the left side.      Patellar reflexes are 2+ on the right side and 2+ on the left side. Skin: Skin is warm. No rash noted. No erythema.  Psychiatric: She has a normal mood and affect. Her speech is normal.  Well groomed, good eye contact.     ASSESSMENT AND PLAN:    Cynthia Berger was seen today for annual exam.  Diagnoses and all orders for this visit:  Lab Results  Component Value Date   CREATININE 1.13 10/24/2017   BUN 10 10/24/2017   NA 139 10/24/2017   K 4.4 10/24/2017   CL 105 10/24/2017   CO2 27 10/24/2017   Lab Results  Component Value Date   CHOL 160 10/24/2017   HDL 49.50 10/24/2017   [place] 95 10/24/2017   TRIG 78.0 10/24/2017   CHOLHDL 3 10/24/2017    Routine general medical examination at a health care facility  We discussed the importance of regular physical activity and healthy diet for prevention of chronic illness and/or complications. Preventive guidelines reviewed. Vaccination updated. Continue following with gyn for her female preventive care. Ca++ and vit D supplementation recommended. Next CPE in a year.  The 10-year ASCVD risk score Mikey Bussing DC Brooke Bonito., et al., 2013) is: 5.6%   Values used to calculate the score:     Age: 20 years     Sex: Female     Is Non-Hispanic African American: Yes     Diabetic: No     Tobacco smoker: No     Systolic Blood Pressure: 017 mmHg     Is BP treated: Yes     HDL Cholesterol: 49.5 mg/dL     Total Cholesterol: 160 mg/dL  Encounter for HCV screening test for high risk patient -     Hepatitis C antibody screen  Vitamin D deficiency  No changes in current management, will follow labs done today and will give further recommendations accordingly.  Diabetes mellitus screening -     Basic metabolic panel  Screening for lipid disorders -     Lipid panel  Essential hypertension  Adequately controlled. No changes in current management. DASH diet recommended. Eye exam recommended annually. Since  she follows with other providers during the year (endocrinologist,urologist,and gyn) I think it is appropriate F/U in 12 months, before if needed. Monitor BP at home.  -     amLODipine (NORVASC)  5 MG tablet; Take 1 tablet (5 mg total) daily by mouth. -     metoprolol succinate (TOPROL-XL) 25 MG 24 hr tablet; Take 1 tablet (25 mg total) daily by mouth.  CKD (chronic kidney disease), stage III (HCC)  Avoid NSAID's. Low salt diet. She is not on ARB or Lisinopril. Adequate control of HTN. Further recommendations will be given according to lab results.  -     Microalbumin / creatinine urine ratio -     VITAMIN D 25 Hydroxy (Vit-D Deficiency, Fractures) -     CBC  Encounter for screening for HIV -     HIV antibody  Colon cancer screening -     Ambulatory referral to Gastroenterology  Need for influenza vaccination -     Flu Vaccine QUAD 36+ mos IM  Need for 23-polyvalent pneumococcal polysaccharide vaccine -     Pneumococcal polysaccharide vaccine 23-valent greater than or equal to 2yo subcutaneous/IM  Other orders -     Zoster Vaccine Adjuvanted [facility]) injection; 0.5 ml in muscle and repeat in 8 weeks    Return in 1 year (on 10/24/2018) for CPE.     Betty G. Martinique, MD  [facility]. [place] office.

## 2017-10-24 ENCOUNTER — Encounter: Payer: Self-pay | Admitting: Family Medicine

## 2017-10-24 ENCOUNTER — Encounter: Payer: Self-pay | Admitting: Gastroenterology

## 2017-10-24 ENCOUNTER — Ambulatory Visit (INDEPENDENT_AMBULATORY_CARE_PROVIDER_SITE_OTHER): Payer: BLUE CROSS/BLUE SHIELD | Admitting: Family Medicine

## 2017-10-24 VITALS — BP 126/82 | HR 64 | Temp 98.5°F | Resp 12 | Ht 63.0 in | Wt 177.4 lb

## 2017-10-24 DIAGNOSIS — Z9189 Other specified personal risk factors, not elsewhere classified: Secondary | ICD-10-CM | POA: Diagnosis not present

## 2017-10-24 DIAGNOSIS — N183 Chronic kidney disease, stage 3 unspecified: Secondary | ICD-10-CM | POA: Insufficient documentation

## 2017-10-24 DIAGNOSIS — I10 Essential (primary) hypertension: Secondary | ICD-10-CM

## 2017-10-24 DIAGNOSIS — Z1211 Encounter for screening for malignant neoplasm of colon: Secondary | ICD-10-CM

## 2017-10-24 DIAGNOSIS — Z131 Encounter for screening for diabetes mellitus: Secondary | ICD-10-CM | POA: Diagnosis not present

## 2017-10-24 DIAGNOSIS — Z13220 Encounter for screening for lipoid disorders: Secondary | ICD-10-CM | POA: Diagnosis not present

## 2017-10-24 DIAGNOSIS — Z23 Encounter for immunization: Secondary | ICD-10-CM

## 2017-10-24 DIAGNOSIS — Z0000 Encounter for general adult medical examination without abnormal findings: Secondary | ICD-10-CM

## 2017-10-24 DIAGNOSIS — Z114 Encounter for screening for human immunodeficiency virus [HIV]: Secondary | ICD-10-CM

## 2017-10-24 DIAGNOSIS — Z1159 Encounter for screening for other viral diseases: Secondary | ICD-10-CM

## 2017-10-24 DIAGNOSIS — E559 Vitamin D deficiency, unspecified: Secondary | ICD-10-CM

## 2017-10-24 LAB — BASIC METABOLIC PANEL
BUN: 10 mg/dL (ref 6–23)
CHLORIDE: 105 meq/L (ref 96–112)
CO2: 27 meq/L (ref 19–32)
CREATININE: 1.13 mg/dL (ref 0.40–1.20)
Calcium: 9.5 mg/dL (ref 8.4–10.5)
GFR: 63.07 mL/min (ref >60.00)
Glucose, Bld: 91 mg/dL (ref 70–99)
Potassium: 4.4 mEq/L (ref 3.5–5.1)
SODIUM: 139 meq/L (ref 135–145)

## 2017-10-24 LAB — MICROALBUMIN / CREATININE URINE RATIO
CREATININE, U: 126.5 mg/dL
Microalb Creat Ratio: 0.6 mg/g (ref 0.0–30.0)

## 2017-10-24 LAB — LIPID PANEL
Cholesterol: 160 mg/dL (ref 0–200)
HDL: 49.5 mg/dL (ref >39.00)
LDL Cholesterol: 95 mg/dL (ref 0–99)
NonHDL: 110.13
Total CHOL/HDL Ratio: 3
Triglycerides: 78 mg/dL (ref 0.0–149.0)
VLDL: 15.6 mg/dL (ref 0.0–40.0)

## 2017-10-24 LAB — CBC
HCT: 40.5 % (ref 36.0–46.0)
HEMOGLOBIN: 13.1 g/dL (ref 12.0–15.0)
MCHC: 32.4 g/dL (ref 30.0–36.0)
MCV: 90.6 fl (ref 78.0–100.0)
Platelets: 281 10*3/uL (ref 150.0–400.0)
RBC: 4.47 Mil/uL (ref 3.87–5.11)
RDW: 13.7 % (ref 11.5–15.5)
WBC: 5.4 10*3/uL (ref 4.0–10.5)

## 2017-10-24 LAB — VITAMIN D 25 HYDROXY (VIT D DEFICIENCY, FRACTURES): VITD: 32.84 ng/mL (ref 30.00–100.00)

## 2017-10-24 MED ORDER — METOPROLOL SUCCINATE ER 25 MG PO TB24: 25.0000 mg | ORAL_TABLET | Freq: Every day | ORAL | 3 refills | Status: DC

## 2017-10-24 MED ORDER — ZOSTER VAC RECOMB ADJUVANTED 50 MCG/0.5ML IM SUSR
INTRAMUSCULAR | 1 refills | Status: DC
Start: 2017-10-24 — End: 2017-12-05

## 2017-10-24 MED ORDER — AMLODIPINE BESYLATE 5 MG PO TABS: 5.0000 mg | ORAL_TABLET | Freq: Every day | ORAL | 3 refills | Status: DC

## 2017-10-24 NOTE — Patient Instructions (Signed)
A few things to remember from today's visit:   Routine general medical examination at a health care facility  Encounter for HCV screening test for high risk patient - Plan: Hepatitis C antibody screen  Vitamin D deficiency  Diabetes mellitus screening - Plan: Basic metabolic panel  Screening for lipid disorders - Plan: Lipid panel  Essential hypertension - Plan: amLODipine (NORVASC) 5 MG tablet, metoprolol succinate (TOPROL-XL) 25 MG 24 hr tablet  CKD (chronic kidney disease), stage III ([place]) - Plan: Microalbumin / creatinine urine ratio, VITAMIN D 25 Hydroxy (Vit-D Deficiency, Fractures), CBC  Encounter for screening for HIV - Plan: HIV antibody  Colon cancer screening - Plan: Ambulatory referral to Gastroenterology  Because you are also following with urologist and endocrinologist regularly during the year,I think I can see you annually. Today you have you routine preventive visit.  At least 150 minutes of moderate exercise per week, daily brisk walking for 15-30 min is a good exercise option. Healthy diet low in saturated (animal) fats and sweets and consisting of fresh fruits and vegetables, lean meats such as fish and white chicken and whole grains.  These are some of recommendations for screening depending of age and risk factors:   - Vaccines:  Tdap vaccine every 10 years.  Shingles vaccine recommended at age 60, could be given after 60 years of age but not sure about insurance coverage.   Pneumonia vaccines:  Prevnar 13 at 65 and Pneumovax at 88. Sometimes Pneumovax is giving earlier if history of smoking, lung disease,diabetes,kidney disease among some.    Screening for diabetes at age 69 and every 3 years.   -Breast cancer: Mammogram Screening is recommended until 60 years old but some women can continue screening depending of healthy issues.   Colon cancer screening: starts at 60 years old until 60 years old.  Cholesterol disorder screening at age 56 and  every 3 years.  Also recommended:  1. Dental visit- Brush and floss your teeth twice daily; visit your dentist twice a year. 2. Eye doctor- Get an eye exam at least every 2 years. 3. Helmet use- Always wear a helmet when riding a bicycle, motorcycle, rollerblading or skateboarding. 4. Safe sex- If you may be exposed to sexually transmitted infections, use a condom. 5. Seat belts- Seat belts can save your live; always wear one. 6. Smoke/Carbon Monoxide detectors- These detectors need to be installed on the appropriate level of your home. Replace batteries at least once a year. 7. Skin cancer- When out in the sun please cover up and use sunscreen 15 SPF or higher. 8. Violence- If anyone is threatening or hurting you, please tell your healthcare provider.  9. Drink alcohol in moderation- Limit alcohol intake to one drink or less per day. Never drink and drive.   Please be sure medication list is accurate. If a new problem present, please set up appointment sooner than planned today.

## 2017-10-25 LAB — HIV ANTIBODY (ROUTINE TESTING W REFLEX): HIV: NONREACTIVE

## 2017-10-25 LAB — HEPATITIS C ANTIBODY
Hepatitis C Ab: NONREACTIVE
SIGNAL TO CUT-OFF: 0.06 (ref <1.00)

## 2017-11-07 ENCOUNTER — Ambulatory Visit: Payer: BLUE CROSS/BLUE SHIELD

## 2017-11-08 ENCOUNTER — Ambulatory Visit
Admission: RE | Admit: 2017-11-08 | Discharge: 2017-11-08 | Disposition: A | Discharge status: Home / Self Care | Payer: BLUE CROSS/BLUE SHIELD | Source: Ambulatory Visit | Attending: Women's Health | Admitting: Women's Health

## 2017-11-08 DIAGNOSIS — Z1231 Encounter for screening mammogram for malignant neoplasm of breast: Secondary | ICD-10-CM

## 2017-11-22 ENCOUNTER — Encounter: Payer: BLUE CROSS/BLUE SHIELD | Admitting: Women's Health

## 2017-11-22 DIAGNOSIS — M71571 Other bursitis, not elsewhere classified, right ankle and foot: Secondary | ICD-10-CM | POA: Diagnosis not present

## 2017-11-22 DIAGNOSIS — L989 Disorder of the skin and subcutaneous tissue, unspecified: Secondary | ICD-10-CM | POA: Diagnosis not present

## 2017-11-22 DIAGNOSIS — M21621 Bunionette of right foot: Secondary | ICD-10-CM | POA: Diagnosis not present

## 2017-11-22 DIAGNOSIS — M7751 Other enthesopathy of right foot: Secondary | ICD-10-CM | POA: Diagnosis not present

## 2017-12-04 ENCOUNTER — Ambulatory Visit (AMBULATORY_SURGERY_CENTER): Payer: Self-pay

## 2017-12-04 ENCOUNTER — Other Ambulatory Visit: Payer: Self-pay

## 2017-12-04 VITALS — Ht 63.0 in | Wt 174.0 lb

## 2017-12-04 DIAGNOSIS — Z1211 Encounter for screening for malignant neoplasm of colon: Secondary | ICD-10-CM

## 2017-12-04 MED ORDER — PLENVU 140 G PO SOLR: 1.0000 | Freq: Once | ORAL | 0 refills | Status: AC

## 2017-12-04 NOTE — Progress Notes (Signed)
Denies allergies to eggs or soy products. Denies complication of anesthesia or sedation. Denies use of weight loss medication. Denies use of O2.   Emmi instructions declined.  

## 2017-12-05 ENCOUNTER — Ambulatory Visit: Payer: BLUE CROSS/BLUE SHIELD | Admitting: Women's Health

## 2017-12-05 ENCOUNTER — Encounter: Payer: Self-pay | Admitting: Women's Health

## 2017-12-05 VITALS — BP 118/80 | Ht 63.0 in | Wt 170.0 lb

## 2017-12-05 DIAGNOSIS — Z01419 Encounter for gynecological examination (general) (routine) without abnormal findings: Secondary | ICD-10-CM

## 2017-12-05 NOTE — Patient Instructions (Signed)
DASH Eating Plan DASH stands for "Dietary Approaches to Stop Hypertension." The DASH eating plan is a healthy eating plan that has been shown to reduce high blood pressure (hypertension). It may also reduce your risk for type 2 diabetes, heart disease, and stroke. The DASH eating plan may also help with weight loss. What are tips for following this plan? General guidelines  Avoid eating more than 2,300 mg (milligrams) of salt (sodium) a day. If you have hypertension, you may need to reduce your sodium intake to 1,500 mg a day.  Limit alcohol intake to no more than 1 drink a day for nonpregnant women and 2 drinks a day for men. One drink equals 12 oz of beer, 5 oz of wine, or 1 oz of hard liquor.  Work with your health care provider to maintain a healthy body weight or to lose weight. Ask what an ideal weight is for you.  Get at least 30 minutes of exercise that causes your heart to beat faster (aerobic exercise) most days of the week. Activities may include walking, swimming, or biking.  Work with your health care provider or diet and nutrition specialist (dietitian) to adjust your eating plan to your individual calorie needs. Reading food labels  Check food labels for the amount of sodium per serving. Choose foods with less than 5 percent of the Daily Value of sodium. Generally, foods with less than 300 mg of sodium per serving fit into this eating plan.  To find whole grains, look for the word "whole" as the first word in the ingredient list. Shopping  Buy products labeled as "low-sodium" or "no salt added."  Buy fresh foods. Avoid canned foods and premade or frozen meals. Cooking  Avoid adding salt when cooking. Use salt-free seasonings or herbs instead of table salt or sea salt. Check with your health care provider or pharmacist before using salt substitutes.  Do not fry foods. Cook foods using healthy methods such as baking, boiling, grilling, and broiling instead.  Cook with  heart-healthy oils, such as olive, canola, soybean, or sunflower oil. Meal planning   Eat a balanced diet that includes: ? 5 or more servings of fruits and vegetables each day. At each meal, try to fill half of your plate with fruits and vegetables. ? Up to 6-8 servings of whole grains each day. ? Less than 6 oz of lean meat, poultry, or fish each day. A 3-oz serving of meat is about the same size as a deck of cards. One egg equals 1 oz. ? 2 servings of low-fat dairy each day. ? A serving of nuts, seeds, or beans 5 times each week. ? Heart-healthy fats. Healthy fats called Omega-3 fatty acids are found in foods such as flaxseeds and coldwater fish, like sardines, salmon, and mackerel.  Limit how much you eat of the following: ? Canned or prepackaged foods. ? Food that is high in trans fat, such as fried foods. ? Food that is high in saturated fat, such as fatty meat. ? Sweets, desserts, sugary drinks, and other foods with added sugar. ? Full-fat dairy products.  Do not salt foods before eating.  Try to eat at least 2 vegetarian meals each week.  Eat more home-cooked food and less restaurant, buffet, and fast food.  When eating at a restaurant, ask that your food be prepared with less salt or no salt, if possible. What foods are recommended? The items listed may not be a complete list. Talk with your dietitian about what   dietary choices are best for you. Grains Whole-grain or whole-wheat bread. Whole-grain or whole-wheat pasta. Brown rice. [place]. Bulgur. Whole-grain and low-sodium cereals. Pita bread. Low-fat, low-sodium crackers. Whole-wheat flour tortillas. Vegetables Fresh or frozen vegetables (raw, steamed, roasted, or grilled). Low-sodium or reduced-sodium tomato and vegetable juice. Low-sodium or reduced-sodium tomato sauce and tomato paste. Low-sodium or reduced-sodium canned vegetables. Fruits All fresh, dried, or frozen fruit. Canned fruit in natural juice (without  added sugar). Meat and other protein foods Skinless chicken or [place]. Ground chicken or [place]. Pork with fat trimmed off. Fish and seafood. Egg whites. Dried beans, peas, or lentils. Unsalted nuts, nut butters, and seeds. Unsalted canned beans. Lean cuts of beef with fat trimmed off. Low-sodium, lean deli meat. Dairy Low-fat (1%) or fat-free (skim) milk. Fat-free, low-fat, or reduced-fat cheeses. Nonfat, low-sodium ricotta or cottage cheese. Low-fat or nonfat yogurt. Low-fat, low-sodium cheese. Fats and oils Soft margarine without trans fats. Vegetable oil. Low-fat, reduced-fat, or light mayonnaise and salad dressings (reduced-sodium). Canola, safflower, olive, soybean, and sunflower oils. Avocado. Seasoning and other foods Herbs. Spices. Seasoning mixes without salt. Unsalted popcorn and pretzels. Fat-free sweets. What foods are not recommended? The items listed may not be a complete list. Talk with your dietitian about what dietary choices are best for you. Grains Baked goods made with fat, such as croissants, muffins, or some breads. Dry pasta or rice meal packs. Vegetables Creamed or fried vegetables. Vegetables in a cheese sauce. Regular canned vegetables (not low-sodium or reduced-sodium). Regular canned tomato sauce and paste (not low-sodium or reduced-sodium). Regular tomato and vegetable juice (not low-sodium or reduced-sodium). Angie Fava. Olives. Fruits Canned fruit in a light or heavy syrup. Fried fruit. Fruit in cream or butter sauce. Meat and other protein foods Fatty cuts of meat. Ribs. Fried meat. Berniece Salines. Sausage. Bologna and other processed lunch meats. Salami. Fatback. Hotdogs. Bratwurst. Salted nuts and seeds. Canned beans with added salt. Canned or smoked fish. Whole eggs or egg yolks. Chicken or [place] with skin. Dairy Whole or 2% milk, cream, and half-and-half. Whole or full-fat cream cheese. Whole-fat or sweetened yogurt. Full-fat cheese. Nondairy creamers. Whipped toppings.  Processed cheese and cheese spreads. Fats and oils Butter. Stick margarine. Lard. Shortening. Ghee. Bacon fat. Tropical oils, such as coconut, palm kernel, or palm oil. Seasoning and other foods Salted popcorn and pretzels. Onion salt, garlic salt, seasoned salt, table salt, and sea salt. Worcestershire sauce. Tartar sauce. Barbecue sauce. Teriyaki sauce. Soy sauce, including reduced-sodium. Steak sauce. Canned and packaged gravies. Fish sauce. Oyster sauce. Cocktail sauce. Horseradish that you find on the shelf. Ketchup. Mustard. Meat flavorings and tenderizers. Bouillon cubes. Hot sauce and Tabasco sauce. Premade or packaged marinades. Premade or packaged taco seasonings. Relishes. Regular salad dressings. Where to find more information:  National Heart, Lung, and [place]: https://wilson-eaton.com/  American Heart Association: www.heart.org Summary  The DASH eating plan is a healthy eating plan that has been shown to reduce high blood pressure (hypertension). It may also reduce your risk for type 2 diabetes, heart disease, and stroke.  With the DASH eating plan, you should limit salt (sodium) intake to 2,300 mg a day. If you have hypertension, you may need to reduce your sodium intake to 1,500 mg a day.  When on the DASH eating plan, aim to eat more fresh fruits and vegetables, whole grains, lean proteins, low-fat dairy, and heart-healthy fats.  Work with your health care provider or diet and nutrition specialist (dietitian) to adjust your eating plan to your individual  calorie needs. This information is not intended to replace advice given to you by your health care provider. Make sure you discuss any questions you have with your health care provider. Document Released: 11/24/2011 Document Revised: 11/28/2016 Document Reviewed: 11/28/2016 Elsevier Interactive Patient Education  2017 [place] Maintenance for Postmenopausal Women Menopause is a normal process in which your  reproductive ability comes to an end. This process happens gradually over a span of months to years, usually between the ages of 110 and 56. Menopause is complete when you have missed 12 consecutive menstrual periods. It is important to talk with your health care provider about some of the most common conditions that affect postmenopausal women, such as heart disease, cancer, and bone loss (osteoporosis). Adopting a healthy lifestyle and getting preventive care can help to promote your health and wellness. Those actions can also lower your chances of developing some of these common conditions. What should I know about menopause? During menopause, you may experience a number of symptoms, such as:  Moderate-to-severe hot flashes.  Night sweats.  Decrease in sex drive.  Mood swings.  Headaches.  Tiredness.  Irritability.  Memory problems.  Insomnia.  Choosing to treat or not to treat menopausal changes is an individual decision that you make with your health care provider. What should I know about hormone replacement therapy and supplements? Hormone therapy products are effective for treating symptoms that are associated with menopause, such as hot flashes and night sweats. Hormone replacement carries certain risks, especially as you become older. If you are thinking about using estrogen or estrogen with progestin treatments, discuss the benefits and risks with your health care provider. What should I know about heart disease and stroke? Heart disease, heart attack, and stroke become more likely as you age. This may be due, in part, to the hormonal changes that your body experiences during menopause. These can affect how your body processes dietary fats, triglycerides, and cholesterol. Heart attack and stroke are both medical emergencies. There are many things that you can do to help prevent heart disease and stroke:  Have your blood pressure checked at least every 1-2 years. High blood  pressure causes heart disease and increases the risk of stroke.  If you are 20-32 years old, ask your health care provider if you should take aspirin to prevent a heart attack or a stroke.  Do not use any tobacco products, including cigarettes, chewing tobacco, or electronic cigarettes. If you need help quitting, ask your health care provider.  It is important to eat a healthy diet and maintain a healthy weight. ? Be sure to include plenty of vegetables, fruits, low-fat dairy products, and lean protein. ? Avoid eating foods that are high in solid fats, added sugars, or salt (sodium).  Get regular exercise. This is one of the most important things that you can do for your health. ? Try to exercise for at least 150 minutes each week. The type of exercise that you do should increase your heart rate and make you sweat. This is known as moderate-intensity exercise. ? Try to do strengthening exercises at least twice each week. Do these in addition to the moderate-intensity exercise.  Know your numbers.Ask your health care provider to check your cholesterol and your blood glucose. Continue to have your blood tested as directed by your health care provider.  What should I know about cancer screening? There are several types of cancer. Take the following steps to reduce your risk and to catch any  cancer development as early as possible. Breast Cancer  Practice breast self-awareness. ? This means understanding how your breasts normally appear and feel. ? It also means doing regular breast self-exams. Let your health care provider know about any changes, no matter how small.  If you are 12 or older, have a clinician do a breast exam (clinical breast exam or CBE) every year. Depending on your age, family history, and medical history, it may be recommended that you also have a yearly breast X-Myles (mammogram).  If you have a family history of breast cancer, talk with your health care provider about  genetic screening.  If you are at high risk for breast cancer, talk with your health care provider about having an MRI and a mammogram every year.  Breast cancer (BRCA) gene test is recommended for women who have family members with BRCA-related cancers. Results of the assessment will determine the need for genetic counseling and BRCA1 and for BRCA2 testing. BRCA-related cancers include these types: ? Breast. This occurs in males or females. ? Ovarian. ? Tubal. This may also be called fallopian tube cancer. ? Cancer of the abdominal or pelvic lining (peritoneal cancer). ? Prostate. ? Pancreatic.  Cervical, Uterine, and Ovarian Cancer Your health care provider may recommend that you be screened regularly for cancer of the pelvic organs. These include your ovaries, uterus, and vagina. This screening involves a pelvic exam, which includes checking for microscopic changes to the surface of your cervix (Pap test).  For women ages 21-65, health care providers may recommend a pelvic exam and a Pap test every three years. For women ages 2-65, they may recommend the Pap test and pelvic exam, combined with testing for human papilloma virus (HPV), every five years. Some types of HPV increase your risk of cervical cancer. Testing for HPV may also be done on women of any age who have unclear Pap test results.  Other health care providers may not recommend any screening for nonpregnant women who are considered low risk for pelvic cancer and have no symptoms. Ask your health care provider if a screening pelvic exam is right for you.  If you have had past treatment for cervical cancer or a condition that could lead to cancer, you need Pap tests and screening for cancer for at least 20 years after your treatment. If Pap tests have been discontinued for you, your risk factors (such as having a new sexual partner) need to be reassessed to determine if you should start having screenings again. Some women have  medical problems that increase the chance of getting cervical cancer. In these cases, your health care provider may recommend that you have screening and Pap tests more often.  If you have a family history of uterine cancer or ovarian cancer, talk with your health care provider about genetic screening.  If you have vaginal bleeding after reaching menopause, tell your health care provider.  There are currently no reliable tests available to screen for ovarian cancer.  Lung Cancer Lung cancer screening is recommended for adults 79-63 years old who are at high risk for lung cancer because of a history of smoking. A yearly low-dose CT scan of the lungs is recommended if you:  Currently smoke.  Have a history of at least 30 pack-years of smoking and you currently smoke or have quit within the past 15 years. A pack-year is smoking an average of one pack of cigarettes per day for one year.  Yearly screening should:  Continue until  it has been 15 years since you quit.  Stop if you develop a health problem that would prevent you from having lung cancer treatment.  Colorectal Cancer  This type of cancer can be detected and can often be prevented.  Routine colorectal cancer screening usually begins at age 50 and continues through age 75.  If you have risk factors for colon cancer, your health care provider may recommend that you be screened at an earlier age.  If you have a family history of colorectal cancer, talk with your health care provider about genetic screening.  Your health care provider may also recommend using home test kits to check for hidden blood in your stool.  A small camera at the end of a tube can be used to examine your colon directly (sigmoidoscopy or colonoscopy). This is done to check for the earliest forms of colorectal cancer.  Direct examination of the colon should be repeated every 5-10 years until age 75. However, if early forms of precancerous polyps or small  growths are found or if you have a family history or genetic risk for colorectal cancer, you may need to be screened more often.  Skin Cancer  Check your skin from head to toe regularly.  Monitor any moles. Be sure to tell your health care provider: ? About any new moles or changes in moles, especially if there is a change in a mole's shape or color. ? If you have a mole that is larger than the size of a pencil eraser.  If any of your family members has a history of skin cancer, especially at a Legion Discher age, talk with your health care provider about genetic screening.  Always use sunscreen. Apply sunscreen liberally and repeatedly throughout the day.  Whenever you are outside, protect yourself by wearing long sleeves, pants, a wide-brimmed hat, and sunglasses.  What should I know about osteoporosis? Osteoporosis is a condition in which bone destruction happens more quickly than new bone creation. After menopause, you may be at an increased risk for osteoporosis. To help prevent osteoporosis or the bone fractures that can happen because of osteoporosis, the following is recommended:  If you are 19-50 years old, get at least 1,000 mg of calcium and at least 600 mg of vitamin D per day.  If you are older than age 50 but younger than age 70, get at least 1,200 mg of calcium and at least 600 mg of vitamin D per day.  If you are older than age 70, get at least 1,200 mg of calcium and at least 800 mg of vitamin D per day.  Smoking and excessive alcohol intake increase the risk of osteoporosis. Eat foods that are rich in calcium and vitamin D, and do weight-bearing exercises several times each week as directed by your health care provider. What should I know about how menopause affects my mental health? Depression may occur at any age, but it is more common as you become older. Common symptoms of depression include:  Low or sad mood.  Changes in sleep patterns.  Changes in appetite or eating  patterns.  Feeling an overall lack of motivation or enjoyment of activities that you previously enjoyed.  Frequent crying spells.  Talk with your health care provider if you think that you are experiencing depression. What should I know about immunizations? It is important that you get and maintain your immunizations. These include:  Tetanus, diphtheria, and pertussis (Tdap) booster vaccine.  Influenza every year before the flu season   begins.  Pneumonia vaccine.  Shingles vaccine.  Your health care provider may also recommend other immunizations. This information is not intended to replace advice given to you by your health care provider. Make sure you discuss any questions you have with your health care provider. Document Released: 01/27/2006 Document Revised: 06/24/2016 Document Reviewed: 09/08/2015 Elsevier Interactive Patient Education  2018 [employer].

## 2017-12-05 NOTE — Progress Notes (Signed)
Cynthia Berger 1955-12-28 [id number]    History:    Presents for annual exam. 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 Normal mammogram history. 2015 normal DEXA. 2005 kidney cancer. Hypertension, GERD primary care manages. History of a goiter with follow-up at endocrinologist. Has had the first Shingrex. Weight is down 20 pounds with diet and exercise.  Past medical history, past surgical history, family history and social history were all reviewed and documented in the EPIC chart. Currently not working, helping caring for grandchildren. Planning a cruise next month for 8 days.  ROS:  A ROS was performed and pertinent positives and negatives are included.  Exam:  Vitals:   12/05/17 1625  BP: 118/80  Weight: 170 lb (77.1 kg)  Height: 5\' 3"  (1.6 m)   Body mass index is 30.11 kg/m.   General appearance:  Normal Thyroid:  Symmetrical, normal in size, without palpable masses or nodularity. Respiratory  Auscultation:  Clear without wheezing or rhonchi Cardiovascular  Auscultation:  Regular rate, without rubs, murmurs or gallops  Edema/varicosities:  Not grossly evident Abdominal  Soft,nontender, without masses, guarding or rebound.  Liver/spleen:  No organomegaly noted  Hernia:  None appreciated  Skin  Inspection:  Grossly normal   Breasts: Examined lying and sitting.     Right: Without masses, retractions, discharge or axillary adenopathy.     Left: Without masses, retractions, discharge or axillary adenopathy. Gentitourinary   Inguinal/mons:  Normal without inguinal adenopathy  External genitalia:  Normal  BUS/Urethra/Skene's glands:  Normal  Vagina:  Normal  Cervix:  Absent  Uterus: Absent  Adnexa/parametria:     Rt: Without masses or tenderness.   Lt: Without masses or tenderness.  Anus and perineum: Normal  Digital rectal exam: Normal sphincter tone without palpated masses or tenderness  Assessment/Plan:  61 y.o. MBF G6 P4 for annual exam with  no complaints.  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 Reviewed importance of continuing regular exercise, weightbearing, fall prevention discussed. Congratulated on weight loss and healthy lifestyle. Pap,    Huel Cote [facility], 4:43 PM 12/05/2017

## 2017-12-07 ENCOUNTER — Other Ambulatory Visit: Payer: Self-pay | Admitting: Women's Health

## 2017-12-07 LAB — PAP IG W/ RFLX HPV ASCU

## 2017-12-07 MED ORDER — METRONIDAZOLE 500 MG PO TABS: 500.0000 mg | ORAL_TABLET | Freq: Two times a day (BID) | ORAL | 0 refills | Status: DC

## 2017-12-25 ENCOUNTER — Encounter: Payer: Self-pay | Admitting: Gastroenterology

## 2017-12-25 ENCOUNTER — Ambulatory Visit (AMBULATORY_SURGERY_CENTER): Payer: BLUE CROSS/BLUE SHIELD | Admitting: Gastroenterology

## 2017-12-25 VITALS — BP 120/80 | HR 69 | Temp 98.0°F | Resp 16 | Ht 63.0 in | Wt 174.0 lb

## 2017-12-25 DIAGNOSIS — Z1211 Encounter for screening for malignant neoplasm of colon: Secondary | ICD-10-CM | POA: Diagnosis not present

## 2017-12-25 DIAGNOSIS — Z1212 Encounter for screening for malignant neoplasm of rectum: Secondary | ICD-10-CM | POA: Diagnosis not present

## 2017-12-25 MED ORDER — SODIUM CHLORIDE 0.9 % IV SOLN: 500.0000 mL | INTRAVENOUS | Status: DC

## 2017-12-25 NOTE — Op Note (Signed)
[facility] Patient Name: Cynthia Berger Procedure Date: 12/25/2017 11:46 AM MRN: [id number] Endoscopist: Remo Lipps P. Armbruster MD, MD Age: 62 Referring MD:  Date of Birth: 1956-04-22 Gender: Female Account #: [id number] Procedure:                Colonoscopy Indications:              Screening for colorectal malignant neoplasm Medicines:                Monitored Anesthesia Care Procedure:                Pre-Anesthesia Assessment:                           - Prior to the procedure, a History and Physical                            was performed, and patient medications and                            allergies were reviewed. The patient's tolerance of                            previous anesthesia was also reviewed. The risks                            and benefits of the procedure and the sedation                            options and risks were discussed with the patient.                            All questions were answered, and informed consent                            was obtained. Prior Anticoagulants: The patient has                            taken no previous anticoagulant or antiplatelet                            agents. ASA Grade Assessment: III - A patient with                            severe systemic disease. After reviewing the risks                            and benefits, the patient was deemed in                            satisfactory condition to undergo the procedure.                           After obtaining informed consent, the colonoscope  was passed under direct vision. Throughout the                            procedure, the patient's blood pressure, pulse, and                            oxygen saturations were monitored continuously. The                            Colonoscope was introduced through the anus and                            advanced to the the cecum, identified by                            appendiceal  orifice and ileocecal valve. The                            colonoscopy was performed without difficulty. The                            patient tolerated the procedure well. The quality                            of the bowel preparation was good. The ileocecal                            valve, appendiceal orifice, and rectum were                            photographed. Scope In: 11:52:31 AM Scope Out: 12:09:41 PM Scope Withdrawal Time: 0 hours 15 minutes 28 seconds  Total Procedure Duration: 0 hours 17 minutes 10 seconds  Findings:                 The perianal and digital rectal examinations were                            normal.                           The colon was tortuous.                           A single small angiodysplastic lesion was found in                            the ascending colon.                           The exam was otherwise without abnormality on                            direct and retroflexion views. No polyps. Complications:            No immediate complications. Estimated blood loss:  None. Estimated Blood Loss:     Estimated blood loss: none. Impression:               - Tortuous colon.                           - A single colonic angiodysplastic lesion.                           - The examination was otherwise normal on direct                            and retroflexion views.                           - No specimens collected. Recommendation:           - Patient has a contact number available for                            emergencies. The signs and symptoms of potential                            delayed complications were discussed with the                            patient. Return to normal activities tomorrow.                            Written discharge instructions were provided to the                            patient.                           - Resume previous diet.                           - Continue present  medications.                           - Repeat colonoscopy in 10 years for screening                            purposes. Remo Lipps P. Armbruster MD, MD 12/25/2017 12:16:00 PM This report has been signed electronically.

## 2017-12-25 NOTE — Progress Notes (Signed)
To PACU, VSS. Report to [email or web address] 

## 2017-12-25 NOTE — Patient Instructions (Signed)
YOU HAD AN ENDOSCOPIC PROCEDURE TODAY AT [facility]:   Refer to the procedure report that was given to you for any specific questions about what was found during the examination.  If the procedure report does not answer your questions, please call your gastroenterologist to clarify.  If you requested that your care partner not be given the details of your procedure findings, then the procedure report has been included in a sealed envelope for you to review at your convenience later.  YOU SHOULD EXPECT: Some feelings of bloating in the abdomen. Passage of more gas than usual.  Walking can help get rid of the air that was put into your GI tract during the procedure and reduce the bloating. If you had a lower endoscopy (such as a colonoscopy or flexible sigmoidoscopy) you may notice spotting of blood in your stool or on the toilet paper. If you underwent a bowel prep for your procedure, you may not have a normal bowel movement for a few days.  Please Note:  You might notice some irritation and congestion in your nose or some drainage.  This is from the oxygen used during your procedure.  There is no need for concern and it should clear up in a day or so.  SYMPTOMS TO REPORT IMMEDIATELY:   Following lower endoscopy (colonoscopy or flexible sigmoidoscopy):  Excessive amounts of blood in the stool  Significant tenderness or worsening of abdominal pains  Swelling of the abdomen that is new, acute  Fever of 100F or higher  For urgent or emergent issues, a gastroenterologist can be reached at any hour by calling [phone].   DIET:  We do recommend a small meal at first, but then you may proceed to your regular diet.  Drink plenty of fluids but you should avoid alcoholic beverages for 24 hours.  ACTIVITY:  You should plan to take it easy for the rest of today and you should NOT DRIVE or use heavy machinery until tomorrow (because of the sedation medicines used during the test).     FOLLOW UP: Our staff will call the number listed on your records the next business day following your procedure to check on you and address any questions or concerns that you may have regarding the information given to you following your procedure. If we do not reach you, we will leave a message.  However, if you are feeling well and you are not experiencing any problems, there is no need to return our call.  We will assume that you have returned to your regular daily activities without incident.  If any biopsies were taken you will be contacted by phone or by letter within the next 1-3 weeks.  Please call us at [phone] if you have not heard about the biopsies in 3 weeks.    SIGNATURES/CONFIDENTIALITY: You and/or your care partner have signed paperwork which will be entered into your electronic medical record.  These signatures attest to the fact that that the information above on your After Visit Summary has been reviewed and is understood.  Full responsibility of the confidentiality of this discharge information lies with you and/or your care-partner.YOU HAD AN ENDOSCOPIC PROCEDURE TODAY AT [facility]:   Refer to the procedure report that was given to you for any specific questions about what was found during the examination.  If the procedure report does not answer your questions, please call your gastroenterologist to clarify.  If you requested that your care partner  not be given the details of your procedure findings, then the procedure report has been included in a sealed envelope for you to review at your convenience later.  YOU SHOULD EXPECT: Some feelings of bloating in the abdomen. Passage of more gas than usual.  Walking can help get rid of the air that was put into your GI tract during the procedure and reduce the bloating. If you had a lower endoscopy (such as a colonoscopy or flexible sigmoidoscopy) you may notice spotting of blood in your stool or on the toilet  paper. If you underwent a bowel prep for your procedure, you may not have a normal bowel movement for a few days.  Please Note:  You might notice some irritation and congestion in your nose or some drainage.  This is from the oxygen used during your procedure.  There is no need for concern and it should clear up in a day or so.  SYMPTOMS TO REPORT IMMEDIATELY:   Following lower endoscopy (colonoscopy or flexible sigmoidoscopy):  Excessive amounts of blood in the stool  Significant tenderness or worsening of abdominal pains  Swelling of the abdomen that is new, acute  Fever of 100F or higher   For urgent or emergent issues, a gastroenterologist can be reached at any hour by calling [phone].   DIET:  We do recommend a small meal at first, but then you may proceed to your regular diet.  Drink plenty of fluids but you should avoid alcoholic beverages for 24 hours.  ACTIVITY:  You should plan to take it easy for the rest of today and you should NOT DRIVE or use heavy machinery until tomorrow (because of the sedation medicines used during the test).    FOLLOW UP: Our staff will call the number listed on your records the next business day following your procedure to check on you and address any questions or concerns that you may have regarding the information given to you following your procedure. If we do not reach you, we will leave a message.  However, if you are feeling well and you are not experiencing any problems, there is no need to return our call.  We will assume that you have returned to your regular daily activities without incident.  If any biopsies were taken you will be contacted by phone or by letter within the next 1-3 weeks.  Please call us at [phone] if you have not heard about the biopsies in 3 weeks.    SIGNATURES/CONFIDENTIALITY: You and/or your care partner have signed paperwork which will be entered into your electronic medical record.  These signatures  attest to the fact that that the information above on your After Visit Summary has been reviewed and is understood.  Full responsibility of the confidentiality of this discharge information lies with you and/or your care-partner.

## 2017-12-26 ENCOUNTER — Telehealth: Payer: Self-pay | Admitting: *Deleted

## 2017-12-26 NOTE — Telephone Encounter (Signed)
  Follow up Call-  Call back number 12/25/2017  Post procedure Call Back phone  # [phone]  Permission to leave phone message Yes  Some recent data might be hidden     Patient questions:  Do you have a fever, pain , or abdominal swelling? No. Pain Score  0 *  Have you tolerated food without any problems? Yes.    Have you been able to return to your normal activities? Yes.    Do you have any questions about your discharge instructions: Diet   No. Medications  No. Follow up visit  No.  Do you have questions or concerns about your Care? No.  Actions: * If pain score is 4 or above: No action needed, pain <4.

## 2017-12-27 ENCOUNTER — Telehealth: Payer: Self-pay | Admitting: Family Medicine

## 2017-12-27 ENCOUNTER — Other Ambulatory Visit: Payer: Self-pay | Admitting: *Deleted

## 2017-12-27 ENCOUNTER — Other Ambulatory Visit: Payer: Self-pay | Admitting: Family Medicine

## 2017-12-27 MED ORDER — SCOPOLAMINE 1 MG/3DAYS TD PT72: 1.0000 | MEDICATED_PATCH | TRANSDERMAL | 0 refills | Status: AC

## 2017-12-27 NOTE — Telephone Encounter (Signed)
Copied from [facility] [id number]. Topic: Quick Communication - See Telephone Encounter >> Dec 27, 2017 12:44 PM Boyd Kerbs wrote: CRM for notification. See Telephone encounter for:   Patient is going on cruise and wanting to see if Dr. Martinique can prescribe Patches for sea sickness  [place], [place] - [place] [address] Phone: [phone] Fax: [phone]   12/27/17.

## 2017-12-27 NOTE — Telephone Encounter (Signed)
Rx for scopolamine patch sent to her pharmacy.  [facility]

## 2017-12-27 NOTE — Telephone Encounter (Signed)
Rx request- Scopolamine patch for trip

## 2018-03-19 DIAGNOSIS — C641 Malignant neoplasm of right kidney, except renal pelvis: Secondary | ICD-10-CM | POA: Diagnosis not present

## 2018-05-30 ENCOUNTER — Emergency Department (HOSPITAL_BASED_OUTPATIENT_CLINIC_OR_DEPARTMENT_OTHER): Payer: BLUE CROSS/BLUE SHIELD

## 2018-05-30 ENCOUNTER — Encounter (HOSPITAL_BASED_OUTPATIENT_CLINIC_OR_DEPARTMENT_OTHER): Payer: Self-pay

## 2018-05-30 ENCOUNTER — Emergency Department (HOSPITAL_BASED_OUTPATIENT_CLINIC_OR_DEPARTMENT_OTHER)
Admission: EM | Admit: 2018-05-30 | Discharge: 2018-05-30 | Disposition: A | Discharge status: Home / Self Care | Payer: BLUE CROSS/BLUE SHIELD | Attending: Emergency Medicine | Admitting: Emergency Medicine

## 2018-05-30 ENCOUNTER — Other Ambulatory Visit: Payer: Self-pay

## 2018-05-30 DIAGNOSIS — S92511A Displaced fracture of proximal phalanx of right lesser toe(s), initial encounter for closed fracture: Secondary | ICD-10-CM | POA: Diagnosis not present

## 2018-05-30 DIAGNOSIS — W228XXA Striking against or struck by other objects, initial encounter: Secondary | ICD-10-CM | POA: Diagnosis not present

## 2018-05-30 DIAGNOSIS — S99921A Unspecified injury of right foot, initial encounter: Secondary | ICD-10-CM | POA: Diagnosis not present

## 2018-05-30 DIAGNOSIS — Y999 Unspecified external cause status: Secondary | ICD-10-CM | POA: Diagnosis not present

## 2018-05-30 DIAGNOSIS — Y939 Activity, unspecified: Secondary | ICD-10-CM | POA: Insufficient documentation

## 2018-05-30 DIAGNOSIS — Y929 Unspecified place or not applicable: Secondary | ICD-10-CM | POA: Insufficient documentation

## 2018-05-30 DIAGNOSIS — I129 Hypertensive chronic kidney disease with stage 1 through stage 4 chronic kidney disease, or unspecified chronic kidney disease: Secondary | ICD-10-CM | POA: Insufficient documentation

## 2018-05-30 DIAGNOSIS — N183 Chronic kidney disease, stage 3 (moderate): Secondary | ICD-10-CM | POA: Insufficient documentation

## 2018-05-30 MED ORDER — BUPIVACAINE HCL 0.25 % IJ SOLN
5.0000 mL | Freq: Once | INTRAMUSCULAR | Status: AC
  Administered 2018-05-30: 5 mL

## 2018-05-30 MED ORDER — OXYCODONE HCL 5 MG PO TABS: 5.0000 mg | ORAL_TABLET | ORAL | 0 refills | Status: DC | PRN

## 2018-05-30 NOTE — ED Triage Notes (Signed)
Injured right pinkly toe 1 hour PTA-deformity noted-to triage in wc

## 2018-05-30 NOTE — ED Provider Notes (Signed)
[facility] EMERGENCY DEPARTMENT Provider Note   CSN: [id number] Arrival date & time: 05/30/18  1201     History   Chief Complaint Chief Complaint  Patient presents with  . Toe Injury    HPI Cynthia Berger is a 62 y.o. female.  HPI   18yF with pain/deformity to R little toe. Kicked piece of furniture shortly before arrival. Denies any other injuries. No numbness or tingling. No intervention prior to arrival.   Past Medical History:  Diagnosis Date  . AC (acromioclavicular) joint bone spurs    HEEL BONE SPURS-SURGERY 12/2010 LEFT FOOT  . Antral gastritis   . Cancer ([facility])   . DDD (degenerative disc disease), cervical   . Esophageal dysmotility   . GERD (gastroesophageal reflux disease)   . Heart palpitations   . Hiatal hernia   . Hypertension   . LGSIL (low grade squamous intraepithelial dysplasia) 2013    C&B Inadequate ECC--positive LGSIL --  . Multinodular goiter    RIGHT LOBE-- PER BX NEGATIVE  . Positive H. pylori test   . Vitamin D deficiency 11/2007   LOW AT 18    Patient Active Problem List   Diagnosis Date Noted  . CKD (chronic kidney disease), stage III ([facility]) 10/24/2017  . Hyperkalemia 03/24/2016  . Dizziness and giddiness 03/06/2015  . GERD (gastroesophageal reflux disease) 01/08/2014  . Cardiomyopathy 05/16/2012  . Neck pain on right side 04/20/2012  . Multinodular goiter 04/18/2012  . 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 Palpitations 03/07/2012  . Abdominal bruit 03/07/2012  . Hypertension 02/23/2012  . History of kidney cancer   . AC (acromioclavicular) joint bone spurs   . Death of family member   . Vitamin D deficiency 11/19/2007    Past Surgical History:  Procedure Laterality Date  . ABDOMINAL HYSTERECTOMY N/A 05/28/2013   Procedure: HYSTERECTOMY ABDOMINAL;  Surgeon: Anastasio Auerbach, MD;  Location: [facility];  Service: Gynecology;  Laterality: N/A;  . BIOPSY THYROID Right 2014   ENLARGED THYROID NO MEDS BENIGN  . CERVICAL BIOPSY  W/  LOOP ELECTRODE EXCISION  10/2012   for persistent low-grade dysplasia  . CESAREAN SECTION  1993   W/ TUBAL LIGATION, BILATERAL  . CYSTOSCOPY N/A 05/28/2013   Procedure: CYSTOSCOPY;  Surgeon: Anastasio Auerbach, MD;  Location: [facility];  Service: Gynecology;  Laterality: N/A;  . esophogeal dilatation    . FOOT SURGERY  2012   left heel spur removed  . HYSTEROSCOPY W/D&C  06/08/2012   Procedure: DILATATION AND CURETTAGE /HYSTEROSCOPY;  Surgeon: Anastasio Auerbach, MD;  Location: [facility];  Service: Gynecology;  Laterality: N/A;  WITH RESECTION OF MYOMA  . NEPHRECTOMY  2005   RIGHT KIDNEY CANCER  . ROTATOR CUFF REPAIR  2010   RIGHT  . s/p Right nephrectomy Right   . SALPINGOOPHORECTOMY Bilateral 05/28/2013   Procedure: BILATERAL SALPINGO OOPHORECTOMY;  Surgeon: Anastasio Auerbach, MD;  Location: [facility];  Service: Gynecology;  Laterality: Bilateral;  . TRANSTHORACIC ECHOCARDIOGRAM  04-10-2012  DR BRIEN CRENSHAW   LVSF MILDLY REDUCED/ EF 45-50%  . TUBAL LIGATION       OB History    Gravida  6   Para  4   Term      Preterm      AB  2   Living  4     SAB  2   TAB      Ectopic      Multiple      Live Births  Home Medications    Prior to Admission medications   Medication Sig Start Date End Date Taking? Authorizing Provider  amLODipine (NORVASC) 5 MG tablet Take 1 tablet (5 mg total) daily by mouth. 10/24/17   Martinique, Betty G, MD  cholecalciferol (VITAMIN D) 1000 UNITS tablet Take 1,000 Units by mouth daily.    [provider]  DEXILANT 60 MG capsule TAKE ONE CAPSULE BY MOUTH ONCE DAILY 02/09/15   Esterwood, Amy S, PA-C  metoprolol succinate (TOPROL-XL) 25 MG 24 hr tablet Take 1 tablet (25 mg total) daily by mouth. 10/24/17   Martinique, Betty G, MD  Multiple Vitamins-Minerals (MULTIVITAMIN PO) Take 1 tablet by mouth daily.     [provider]  OVER THE COUNTER MEDICATION Probiotic capsule. One capsule daily.    [provider]  oxyCODONE (ROXICODONE) 5 MG immediate release tablet Take 1 tablet (5 mg total) by mouth every 4 (four) hours as needed for severe pain. 05/30/18   Virgel Manifold, MD  dicyclomine (BENTYL) 20 MG tablet Take 1 tablet (20 mg total) by mouth 2 (two) times daily. 10/21/11 03/07/12  [place] Dragon, MD    Family History Family History  Problem Relation Age of Onset  . Hypertension Mother   . Hypertension Sister   . Hypertension Son   . Colon cancer Neg Hx   . Esophageal cancer Neg Hx   . Rectal cancer Neg Hx   . Stomach cancer Neg Hx   . Breast cancer Neg Hx     Social History Social History   Tobacco Use  . Smoking status: Never Smoker  . Smokeless tobacco: Never Used  Substance Use Topics  . Alcohol use: Yes    Alcohol/week: 0.0 oz    Comment: occasional wine  . Drug use: No     Allergies   Penicillins   Review of Systems Review of Systems  All systems reviewed and negative, other than as noted in HPI.  Physical Exam Updated Vital Signs BP (!) 155/73 (BP Location: Right Arm)   Pulse (!) 59   Temp 97.9 F (36.6 C) (Oral)   Resp 18   Ht 5\' 3"  (1.6 m)   Wt 77.1 kg (170 lb)   LMP 08/23/2006   SpO2 100%   BMI 30.11 kg/m   Physical Exam  Constitutional: She appears well-developed and well-nourished. No distress.  HENT:  Head: Normocephalic and atraumatic.  Eyes: Conjunctivae are normal. Right eye exhibits no discharge. Left eye exhibits no discharge.  Neck: Neck supple.  Cardiovascular: Normal rate, regular rhythm and normal heart sounds. Exam reveals no gallop and no friction rub.  No murmur heard. Pulmonary/Chest: Effort normal and breath sounds normal. No respiratory distress.  Abdominal: Soft. She exhibits no distension. There is no tenderness.  Musculoskeletal: She exhibits no edema or tenderness.  Deformity right little toe.  Toe is roughly at 45 degrees angle relative to the positioning of the adjacent toe. significant tenderness near the MP  joint.  Closed injury.  Cap refill in the tip of the toe with intact sensation.  Neurological: She is alert.  Skin: Skin is warm and dry.  Psychiatric: She has a normal mood and affect. Her behavior is normal. Thought content normal.  Nursing note and vitals reviewed.    ED Treatments / Results  Labs (all labs ordered are listed, but only abnormal results are displayed) Labs Reviewed - No data to display  EKG None  Radiology Dg Toe 5th Right  Result Date: 05/30/2018  CLINICAL DATA:  Hit toe against piece of furniture EXAM: RIGHT FIFTH TOE: 3 V COMPARISON:  None. FINDINGS: Frontal, oblique, and lateral views were obtained. There is an obliquely oriented fracture of the distal aspect of the fifth proximal phalanx with dorsal and lateral lateral angulation and displacement laterally. No other fracture. No dislocation. Joint spaces appear normal. No erosive change. IMPRESSION: Fracture, distal aspect fifth proximal phalanx with dorsal and lateral angulation and displacement distally. No other fractures. No dislocation. No evident arthropathic change. Electronically Signed   By: Lowella Grip III M.D.   On: 05/30/2018 12:57    Procedures Procedures (including critical care time)  Reduction of fracture/dislocation Date/Time: 1:32 PM Performed by: Virgel Manifold Authorized by: Virgel Manifold Consent: Verbal consent obtained. Risks and benefits: risks, benefits and alternatives were discussed Consent given by: patient Required items: required blood products, implants, devices, and special equipment available Time out: Immediately prior to procedure a "time out" was called to verify the correct patient, procedure, equipment, support staff and site/side marked as required.  Patient sedated: no. Anesthesia with digital block.   Vitals: Vital signs were monitored during sedation. Patient tolerance: Patient tolerated the procedure well with no immediate complications. Joint: R little  toe Reduction technique: traction  NERVE BLOCK Performed by: Virgel Manifold Consent: Verbal consent obtained. Required items: required blood products, implants, devices, and special equipment available Time out: Immediately prior to procedure a "time out" was called to verify the correct patient, procedure, equipment, support staff and site/side marked as required.  Indication: pain Nerve block body site: R little toe  Preparation: Patient was prepped and draped in the usual sterile fashion. Needle gauge: 40 G Location technique: anatomical landmarks  Local anesthetic: 0.25% marcaine w/o epinephrine  Anesthetic total: 2 ml  Outcome: pain improved Patient tolerance: Patient tolerated the procedure well with no immediate complications.     Medications Ordered in ED Medications  bupivacaine (MARCAINE) 0.25 % (with pres) injection 5 mL (5 mLs Infiltration Given 05/30/18 1308)     Initial Impression / Assessment and Plan / ED Course  I have reviewed the triage vital signs and the nursing notes.  Pertinent labs & imaging results that were available during my care of the patient were reviewed by me and considered in my medical decision making (see chart for details).     Closed displaced R little toe fracture. Digital block, reduced and then buddy taped. She has a single kidney s/p nephrectomy. Avoid NSAIDs. PRN tylenol and oxycodone. Ortho FU.   Final Clinical Impressions(s) / ED Diagnoses   Final diagnoses:  Closed displaced fracture of proximal phalanx of lesser toe of right foot, initial encounter    ED Discharge Orders        Ordered    oxyCODONE (ROXICODONE) 5 MG immediate release tablet  Every 4 hours PRN     05/30/18 1326       Virgel Manifold, MD 06/01/18 1407

## 2018-06-01 ENCOUNTER — Ambulatory Visit (INDEPENDENT_AMBULATORY_CARE_PROVIDER_SITE_OTHER): Payer: BLUE CROSS/BLUE SHIELD | Admitting: Orthopaedic Surgery

## 2018-06-01 ENCOUNTER — Encounter (INDEPENDENT_AMBULATORY_CARE_PROVIDER_SITE_OTHER): Payer: Self-pay | Admitting: Orthopaedic Surgery

## 2018-06-01 VITALS — BP 178/98 | HR 59 | Ht 63.0 in | Wt 170.0 lb

## 2018-06-01 DIAGNOSIS — S92511A Displaced fracture of proximal phalanx of right lesser toe(s), initial encounter for closed fracture: Secondary | ICD-10-CM

## 2018-06-01 NOTE — Progress Notes (Signed)
Office Visit Note   Patient: Cynthia Berger           Date of Birth: 1956/10/20           MRN: [id number] Visit Date: 06/01/2018              Requested by: Martinique, Betty G, MD [address] [place], [place] [address] PCP: Martinique, Betty G, MD   Assessment & Plan: Visit Diagnoses:  1. Closed displaced fracture of proximal phalanx of lesser toe of right foot, initial encounter     Plan: Postop shoe applied.  We will leave the buddy taped recheck her in 1 week for buddy tape change.  Should keep her foot elevated.  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  We will obtain a single AP x-Seigler on return in 1 week and the buddy tape before we change it.  Follow-Up Instructions: No follow-ups on file.   Orders:  No orders of the defined types were placed in this encounter.  No orders of the defined types were placed in this encounter.     Procedures: No procedures performed   Clinical Data: No additional findings.   Subjective: Chief Complaint  Patient presents with  . Right Foot - Fracture    HPI 61 year old female injured her right fifth toe with a closed injury when she was walking through her house hit her toe on a piece of furniture with a angulated displaced fracture.  She went to the emergency room where it was buddy taped.  They gave her some Percocet.  Fracture was through the fifth proximal phalanx with dorsal length lateral angulation.  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  History of kidney cancer she has solitary kidney.  Previous hysterectomy history of cardiomyopathy GERD palpitations vitamin D deficiency.  Otherwise negative as it pertains HPI.   Objective: Vital Signs: BP (!) 178/98   Pulse (!) 59   Ht 5\' 3"  (1.6 m)   Wt 170 lb (77.1 kg)   LMP 08/23/2006   BMI 30.11 kg/m   Physical Exam  Constitutional: She is oriented to person, place, and time. She appears well-developed.  HENT:    Head: Normocephalic.  Right Ear: External ear normal.  Left Ear: External ear normal.  Eyes: Pupils are equal, round, and reactive to light.  Neck: No tracheal deviation present. No thyromegaly present.  Cardiovascular: Normal rate.  Pulmonary/Chest: Effort normal.  Abdominal: Soft.  Neurological: She is alert and oriented to person, place, and time.  Skin: Skin is warm and dry.  Psychiatric: She has a normal mood and affect. Her behavior is normal.    Ortho Exam patient's buddy tape toe angulation looks good she has good capillary refill to the toe.  Specialty Comments:  No specialty comments available.  Imaging: No results found.   PMFS History: Patient Active Problem List   Diagnosis Date Noted  . CKD (chronic kidney disease), stage III ([facility]) 10/24/2017  . Hyperkalemia 03/24/2016  . Dizziness and giddiness 03/06/2015  . GERD (gastroesophageal reflux disease) 01/08/2014  . Cardiomyopathy 05/16/2012  . Neck pain on right side 04/20/2012  . Multinodular goiter 04/18/2012  . 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 Palpitations 03/07/2012  . Abdominal bruit 03/07/2012  . Hypertension 02/23/2012  . History of kidney cancer   . AC (acromioclavicular) joint bone spurs   . Death of family member   . Vitamin D deficiency 11/19/2007   Past  Medical History:  Diagnosis Date  . AC (acromioclavicular) joint bone spurs    HEEL BONE SPURS-SURGERY 12/2010 LEFT FOOT  . Antral gastritis   . Cancer ([facility])   . DDD (degenerative disc disease), cervical   . Esophageal dysmotility   . GERD (gastroesophageal reflux disease)   . Heart palpitations   . Hiatal hernia   . Hypertension   . LGSIL (low grade squamous intraepithelial dysplasia) 2013    C&B Inadequate ECC--positive LGSIL --  . Multinodular goiter    RIGHT LOBE-- PER BX NEGATIVE  . Positive H. pylori test   . Vitamin D deficiency 11/2007   LOW AT 18    Family History  Problem Relation Age of Onset  . Hypertension Mother   .  Hypertension Sister   . Hypertension Son   . Colon cancer Neg Hx   . Esophageal cancer Neg Hx   . Rectal cancer Neg Hx   . Stomach cancer Neg Hx   . Breast cancer Neg Hx     Past Surgical History:  Procedure Laterality Date  . ABDOMINAL HYSTERECTOMY N/A 05/28/2013   Procedure: HYSTERECTOMY ABDOMINAL;  Surgeon: Anastasio Auerbach, MD;  Location: [facility];  Service: Gynecology;  Laterality: N/A;  . BIOPSY THYROID Right 2014   ENLARGED THYROID NO MEDS BENIGN  . CERVICAL BIOPSY  W/ LOOP ELECTRODE EXCISION  10/2012   for persistent low-grade dysplasia  . CESAREAN SECTION  1993   W/ TUBAL LIGATION, BILATERAL  . CYSTOSCOPY N/A 05/28/2013   Procedure: CYSTOSCOPY;  Surgeon: Anastasio Auerbach, MD;  Location: [facility];  Service: Gynecology;  Laterality: N/A;  . esophogeal dilatation    . FOOT SURGERY  2012   left heel spur removed  . HYSTEROSCOPY W/D&C  06/08/2012   Procedure: DILATATION AND CURETTAGE /HYSTEROSCOPY;  Surgeon: Anastasio Auerbach, MD;  Location: [facility];  Service: Gynecology;  Laterality: N/A;  WITH RESECTION OF MYOMA  . NEPHRECTOMY  2005   RIGHT KIDNEY CANCER  . ROTATOR CUFF REPAIR  2010   RIGHT  . s/p Right nephrectomy Right   . SALPINGOOPHORECTOMY Bilateral 05/28/2013   Procedure: BILATERAL SALPINGO OOPHORECTOMY;  Surgeon: Anastasio Auerbach, MD;  Location: [facility];  Service: Gynecology;  Laterality: Bilateral;  . TRANSTHORACIC ECHOCARDIOGRAM  04-10-2012  DR BRIEN CRENSHAW   LVSF MILDLY REDUCED/ EF 45-50%  . TUBAL LIGATION     Social History   Occupational History  . Occupation: [employer]    Employer: [employer]  Tobacco Use  . Smoking status: Never Smoker  . Smokeless tobacco: Never Used  Substance and Sexual Activity  . Alcohol use: Yes    Alcohol/week: 0.0 oz    Comment: occasional wine  . Drug use: No  . Sexual activity: Yes    Partners: Male    Birth control/protection: Post-menopausal    Comment: INTERCOURSE AGE 63, SEXUAL PARTNERS  LESS THAN 5

## 2018-06-06 ENCOUNTER — Ambulatory Visit (INDEPENDENT_AMBULATORY_CARE_PROVIDER_SITE_OTHER): Payer: BLUE CROSS/BLUE SHIELD | Admitting: Orthopaedic Surgery

## 2018-06-06 ENCOUNTER — Ambulatory Visit (INDEPENDENT_AMBULATORY_CARE_PROVIDER_SITE_OTHER): Payer: BLUE CROSS/BLUE SHIELD

## 2018-06-06 ENCOUNTER — Encounter (INDEPENDENT_AMBULATORY_CARE_PROVIDER_SITE_OTHER): Payer: Self-pay | Admitting: Orthopaedic Surgery

## 2018-06-06 VITALS — BP 152/94 | HR 61 | Ht 63.0 in | Wt 170.0 lb

## 2018-06-06 DIAGNOSIS — S92511A Displaced fracture of proximal phalanx of right lesser toe(s), initial encounter for closed fracture: Secondary | ICD-10-CM | POA: Diagnosis not present

## 2018-06-06 NOTE — Progress Notes (Signed)
Office Visit Note   Patient: Cynthia Berger           Date of Birth: [birth year]           MRN: [id number] Visit Date: 06/06/2018              Requested by: Martinique, Betty G, MD [address] [place], [place] [address] PCP: Martinique, Betty G, MD   Assessment & Plan: Visit Diagnoses:  1. Closed displaced fracture of proximal phalanx of lesser toe of right foot, initial encounter     Plan: Buddy tape change skin is intact.  Buddy tape 4th-5th toe.  She can change the tape if needed I will recheck her again in 3 weeks for clinical exam no x-Karlen needed on return.  Follow-Up Instructions: No follow-ups on file.   Orders:  Orders Placed This Encounter  Procedures  . XR Toe 5th Right   No orders of the defined types were placed in this encounter.     Procedures: No procedures performed   Clinical Data: No additional findings.   Subjective: Chief Complaint  Patient presents with  . Right 5th Toe - Fracture, Follow-up    HPI 62 year old female returns for follow-up of right fifth toe proximal phalanx fracture.  She is been buddy taped has [place]ovan using a wooden shoe crutches and has been keeping her foot elevated she still has considerable foot swelling there is no skin ulcerations.  Review of Systems 14 point update unchanged from last office visit.   Objective: Vital Signs: BP (!) 152/94   Pulse 61   Ht 5\' 3"  (1.6 m)   Wt 170 lb (77.1 kg)   LMP 08/23/2006   BMI 30.11 kg/m   Physical Exam  Constitutional: She is oriented to person, place, and time. She appears well-developed.  HENT:  Head: Normocephalic.  Right Ear: External ear normal.  Left Ear: External ear normal.  Eyes: Pupils are equal, round, and reactive to light.  Neck: No tracheal deviation present. No thyromegaly present.  Cardiovascular: Normal rate.  Pulmonary/Chest: Effort normal.  Abdominal: Soft.  Neurological: She is alert and oriented to person, place, and time.  Skin: Skin is warm and  dry.  Psychiatric: She has a normal mood and affect. Her behavior is normal.    Ortho Exam right forefoot edema.  Skin is intact.  Buddy tape changed.  Good capillary refill. Specialty Comments:  No specialty comments available.  Imaging: Xr Toe 5th Right  Result Date: 06/06/2018 X-rays right foot obtained.  This demonstrates fracture of proximal phalanx with some angulation.  There is some shortening. Impression right fifth toe proximal phalanx oblique shaft fracture.    PMFS History: Patient Active Problem List   Diagnosis Date Noted  . CKD (chronic kidney disease), stage III ([facility]) 10/24/2017  . Hyperkalemia 03/24/2016  . Dizziness and giddiness 03/06/2015  . GERD (gastroesophageal reflux disease) 01/08/2014  . Cardiomyopathy 05/16/2012  . Neck pain on right side 04/20/2012  . Multinodular goiter 04/18/2012  . 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 Palpitations 03/07/2012  . Abdominal bruit 03/07/2012  . Hypertension 02/23/2012  . History of kidney cancer   . AC (acromioclavicular) joint bone spurs   . Death of family member   . Vitamin D deficiency 11/19/2007   Past Medical History:  Diagnosis Date  . AC (acromioclavicular) joint bone spurs    HEEL BONE SPURS-SURGERY 12/2010 LEFT FOOT  . Antral gastritis   . Cancer ([facility])   . DDD (degenerative disc disease),  cervical   . Esophageal dysmotility   . GERD (gastroesophageal reflux disease)   . Heart palpitations   . Hiatal hernia   . Hypertension   . LGSIL (low grade squamous intraepithelial dysplasia) 2013    C&B Inadequate ECC--positive LGSIL --  . Multinodular goiter    RIGHT LOBE-- PER BX NEGATIVE  . Positive H. pylori test   . Vitamin D deficiency 11/2007   LOW AT 18    Family History  Problem Relation Age of Onset  . Hypertension Mother   . Hypertension Sister   . Hypertension Son   . Colon cancer Neg Hx   . Esophageal cancer Neg Hx   . Rectal cancer Neg Hx   . Stomach cancer Neg Hx   . Breast cancer Neg Hx       Past Surgical History:  Procedure Laterality Date  . ABDOMINAL HYSTERECTOMY N/A 05/28/2013   Procedure: HYSTERECTOMY ABDOMINAL;  Surgeon: Anastasio Auerbach, MD;  Location: [facility];  Service: Gynecology;  Laterality: N/A;  . BIOPSY THYROID Right 2014   ENLARGED THYROID NO MEDS BENIGN  . CERVICAL BIOPSY  W/ LOOP ELECTRODE EXCISION  10/2012   for persistent low-grade dysplasia  . CESAREAN SECTION  1993   W/ TUBAL LIGATION, BILATERAL  . CYSTOSCOPY N/A 05/28/2013   Procedure: CYSTOSCOPY;  Surgeon: Anastasio Auerbach, MD;  Location: [facility];  Service: Gynecology;  Laterality: N/A;  . esophogeal dilatation    . FOOT SURGERY  2012   left heel spur removed  . HYSTEROSCOPY W/D&C  06/08/2012   Procedure: DILATATION AND CURETTAGE /HYSTEROSCOPY;  Surgeon: Anastasio Auerbach, MD;  Location: [facility];  Service: Gynecology;  Laterality: N/A;  WITH RESECTION OF MYOMA  . NEPHRECTOMY  2005   RIGHT KIDNEY CANCER  . ROTATOR CUFF REPAIR  2010   RIGHT  . s/p Right nephrectomy Right   . SALPINGOOPHORECTOMY Bilateral 05/28/2013   Procedure: BILATERAL SALPINGO OOPHORECTOMY;  Surgeon: Anastasio Auerbach, MD;  Location: [facility];  Service: Gynecology;  Laterality: Bilateral;  . TRANSTHORACIC ECHOCARDIOGRAM  04-10-2012  DR BRIEN CRENSHAW   LVSF MILDLY REDUCED/ EF 45-50%  . TUBAL LIGATION     Social History   Occupational History  . Occupation: [employer]    Employer: [employer]  Tobacco Use  . Smoking status: Never Smoker  . Smokeless tobacco: Never Used  Substance and Sexual Activity  . Alcohol use: Yes    Alcohol/week: 0.0 oz    Comment: occasional wine  . Drug use: No  . Sexual activity: Yes    Partners: Male    Birth control/protection: Post-menopausal    Comment: INTERCOURSE AGE 36, SEXUAL PARTNERS LESS THAN 5

## 2018-06-18 DIAGNOSIS — N3010 Interstitial cystitis (chronic) without hematuria: Secondary | ICD-10-CM | POA: Diagnosis not present

## 2018-06-27 ENCOUNTER — Ambulatory Visit (INDEPENDENT_AMBULATORY_CARE_PROVIDER_SITE_OTHER): Payer: BLUE CROSS/BLUE SHIELD | Admitting: Orthopaedic Surgery

## 2018-06-27 ENCOUNTER — Encounter (INDEPENDENT_AMBULATORY_CARE_PROVIDER_SITE_OTHER): Payer: Self-pay | Admitting: Orthopaedic Surgery

## 2018-06-27 VITALS — BP 144/82 | HR 58 | Ht 63.0 in | Wt 170.0 lb

## 2018-06-27 DIAGNOSIS — S92501D Displaced unspecified fracture of right lesser toe(s), subsequent encounter for fracture with routine healing: Secondary | ICD-10-CM | POA: Diagnosis not present

## 2018-06-27 NOTE — Progress Notes (Signed)
Office Visit Note   Patient: Cynthia Berger           Date of Birth: 1956/08/11           MRN: [id number] Visit Date: 06/27/2018              Requested by: Martinique, Betty G, MD [address] [place], [place] [address] PCP: Martinique, Betty G, MD   Assessment & Plan: Visit Diagnoses:  1. Closed displaced fracture of phalanx of lesser toe of right foot with routine healing, unspecified phalanx, subsequent encounter     Plan: Buddy tape dressing changed.  Alignment looks good good capillary refill no skin issues.  She can continue taping and prefers to wrap it across the forefoot rather than to the fourth toe.  Once swelling is down she can gradually work her way back into regular shoes.  Follow-up here on a as needed basis.  Follow-Up Instructions: Return if symptoms worsen or fail to improve.   Orders:  No orders of the defined types were placed in this encounter.  No orders of the defined types were placed in this encounter.     Procedures: No procedures performed   Clinical Data: No additional findings.   Subjective: Chief Complaint  Patient presents with  . Right 5th Toe - Fracture, Follow-up    HPI follow-up closed right fifth toe fracture which is been buddy taped.  No new x-rays are obtained today.  She still has swelling in her foot still not able to wear regular shoes.  She is done okay with flip-flops or sandals.  Review of Systems reviewed updated and unchanged.   Objective: Vital Signs: BP (!) 144/82   Pulse (!) 58   Ht 5\' 3"  (1.6 m)   Wt 170 lb (77.1 kg)   LMP 08/23/2006   BMI 30.11 kg/m   Physical Exam  Constitutional: She is oriented to person, place, and time. She appears well-developed.  HENT:  Head: Normocephalic.  Right Ear: External ear normal.  Left Ear: External ear normal.  Eyes: Pupils are equal, round, and reactive to light.  Neck: No tracheal deviation present. No thyromegaly present.  Cardiovascular: Normal rate.    Pulmonary/Chest: Effort normal.  Abdominal: Soft.  Neurological: She is alert and oriented to person, place, and time.  Skin: Skin is warm and dry.  Psychiatric: She has a normal mood and affect. Her behavior is normal.    Ortho Exam wrap removed alignment of the toes remains good she still has swelling of the toe tenderness.  Good capillary refill sensation in the toes intact.  New wrap applied.  Specialty Comments:  No specialty comments available.  Imaging: No results found.   PMFS History: Patient Active Problem List   Diagnosis Date Noted  . CKD (chronic kidney disease), stage III ([facility]) 10/24/2017  . Hyperkalemia 03/24/2016  . Dizziness and giddiness 03/06/2015  . GERD (gastroesophageal reflux disease) 01/08/2014  . Cardiomyopathy 05/16/2012  . Neck pain on right side 04/20/2012  . Multinodular goiter 04/18/2012  . 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 Palpitations 03/07/2012  . Abdominal bruit 03/07/2012  . Hypertension 02/23/2012  . History of kidney cancer   . AC (acromioclavicular) joint bone spurs   . Death of family member   . Vitamin D deficiency 11/19/2007   Past Medical History:  Diagnosis Date  . AC (acromioclavicular) joint bone spurs    HEEL BONE SPURS-SURGERY 12/2010 LEFT FOOT  . Antral gastritis   . Cancer ([facility])   .  DDD (degenerative disc disease), cervical   . Esophageal dysmotility   . GERD (gastroesophageal reflux disease)   . Heart palpitations   . Hiatal hernia   . Hypertension   . LGSIL (low grade squamous intraepithelial dysplasia) 2013    C&B Inadequate ECC--positive LGSIL --  . Multinodular goiter    RIGHT LOBE-- PER BX NEGATIVE  . Positive H. pylori test   . Vitamin D deficiency 11/2007   LOW AT 18    Family History  Problem Relation Age of Onset  . Hypertension Mother   . Hypertension Sister   . Hypertension Son   . Colon cancer Neg Hx   . Esophageal cancer Neg Hx   . Rectal cancer Neg Hx   . Stomach cancer Neg Hx   . Breast  cancer Neg Hx     Past Surgical History:  Procedure Laterality Date  . ABDOMINAL HYSTERECTOMY N/A 05/28/2013   Procedure: HYSTERECTOMY ABDOMINAL;  Surgeon: Anastasio Auerbach, MD;  Location: [facility];  Service: Gynecology;  Laterality: N/A;  . BIOPSY THYROID Right 2014   ENLARGED THYROID NO MEDS BENIGN  . CERVICAL BIOPSY  W/ LOOP ELECTRODE EXCISION  10/2012   for persistent low-grade dysplasia  . CESAREAN SECTION  1993   W/ TUBAL LIGATION, BILATERAL  . CYSTOSCOPY N/A 05/28/2013   Procedure: CYSTOSCOPY;  Surgeon: Anastasio Auerbach, MD;  Location: [facility];  Service: Gynecology;  Laterality: N/A;  . esophogeal dilatation    . FOOT SURGERY  2012   left heel spur removed  . HYSTEROSCOPY W/D&C  06/08/2012   Procedure: DILATATION AND CURETTAGE /HYSTEROSCOPY;  Surgeon: Anastasio Auerbach, MD;  Location: [facility];  Service: Gynecology;  Laterality: N/A;  WITH RESECTION OF MYOMA  . NEPHRECTOMY  2005   RIGHT KIDNEY CANCER  . ROTATOR CUFF REPAIR  2010   RIGHT  . s/p Right nephrectomy Right   . SALPINGOOPHORECTOMY Bilateral 05/28/2013   Procedure: BILATERAL SALPINGO OOPHORECTOMY;  Surgeon: Anastasio Auerbach, MD;  Location: [facility];  Service: Gynecology;  Laterality: Bilateral;  . TRANSTHORACIC ECHOCARDIOGRAM  04-10-2012  DR BRIEN CRENSHAW   LVSF MILDLY REDUCED/ EF 45-50%  . TUBAL LIGATION     Social History   Occupational History  . Occupation: [employer]    Employer: [employer]  Tobacco Use  . Smoking status: Never Smoker  . Smokeless tobacco: Never Used  Substance and Sexual Activity  . Alcohol use: Yes    Alcohol/week: 0.0 oz    Comment: occasional wine  . Drug use: No  . Sexual activity: Yes    Partners: Male    Birth control/protection: Post-menopausal    Comment: INTERCOURSE AGE 53, SEXUAL PARTNERS LESS THAN 5

## 2018-07-09 DIAGNOSIS — N3010 Interstitial cystitis (chronic) without hematuria: Secondary | ICD-10-CM | POA: Diagnosis not present

## 2018-08-08 ENCOUNTER — Ambulatory Visit (INDEPENDENT_AMBULATORY_CARE_PROVIDER_SITE_OTHER): Payer: BLUE CROSS/BLUE SHIELD | Admitting: Orthopaedic Surgery

## 2018-08-08 ENCOUNTER — Ambulatory Visit (INDEPENDENT_AMBULATORY_CARE_PROVIDER_SITE_OTHER): Payer: BLUE CROSS/BLUE SHIELD

## 2018-08-08 ENCOUNTER — Encounter (INDEPENDENT_AMBULATORY_CARE_PROVIDER_SITE_OTHER): Payer: Self-pay | Admitting: Orthopaedic Surgery

## 2018-08-08 VITALS — BP 148/75 | HR 69 | Ht 63.0 in | Wt 170.0 lb

## 2018-08-08 DIAGNOSIS — S92501D Displaced unspecified fracture of right lesser toe(s), subsequent encounter for fracture with routine healing: Secondary | ICD-10-CM | POA: Diagnosis not present

## 2018-08-08 DIAGNOSIS — S92411P Displaced fracture of proximal phalanx of right great toe, subsequent encounter for fracture with malunion: Secondary | ICD-10-CM | POA: Diagnosis not present

## 2018-08-08 NOTE — Progress Notes (Signed)
Office Visit Note   Patient: Cynthia Berger           Date of Birth: Jan 29, 1956           MRN: [id number] Visit Date: 08/08/2018              Requested by: Martinique, Betty G, MD [address] [place], [place] [address] PCP: Martinique, Betty G, MD   Assessment & Plan: Visit Diagnoses:  1. Closed displaced fracture of phalanx of lesser toe of right foot with routine healing, unspecified phalanx, subsequent encounter   2. Closed displaced fracture of proximal phalanx of right great toe with malunion, subsequent encounter     Plan: Healed right fifth toe proximal phalanx fracture with malunion and lateral and dorsal deviation.  It is a problem with shoe wearing for her.  Plan would be toe osteotomy and pinning to correct the deformity.  We discussed 4 to 6 weeks of pin retention to hold the toe straight as it heals.  Questions were elicited and answered she understands and requests we proceed.  Risk of infection nonunion discussed.  Symptoms elicited and answered.  Patient has been jaundiced with her today and included in the discussion.  X-rays with results were reviewed including original fracture x-rays.  Follow-Up Instructions: No follow-ups on file.   Orders:  Orders Placed This Encounter  Procedures  . XR Toe 5th Right   No orders of the defined types were placed in this encounter.     Procedures: No procedures performed   Clinical Data: No additional findings.   Subjective: Chief Complaint  Patient presents with  . Right 5th Toe - Fracture, Follow-up    HPI 62 year old female returns for follow-up of fifth toe proximal phalanx fracture.  She is been buddy taping her toe religiously to the fourth toe.  X-rays obtained today demonstrates healing of the fracture but she has 40 degree extension and 40 degrees lateral deviation of the toe.  She has catching of the toe when she tries to wear shoes and she wears sandals the toe hangs out over the lateral side without the buddy  tape applied.  Review of Systems positive for hypertension solitary kidney history of kidney cancer with nephrectomy, multinodular goiter, GERD.   Objective: Vital Signs: BP (!) 148/75   Pulse 69   Ht 5\' 3"  (1.6 m)   Wt 170 lb (77.1 kg)   LMP 08/23/2006   BMI 30.11 kg/m   Physical Exam  Constitutional: She is oriented to person, place, and time. She appears well-developed.  HENT:  Head: Normocephalic.  Right Ear: External ear normal.  Left Ear: External ear normal.  Eyes: Pupils are equal, round, and reactive to light.  Neck: No tracheal deviation present. No thyromegaly present.  Cardiovascular: Normal rate.  Pulmonary/Chest: Effort normal.  Abdominal: Soft.  Neurological: She is alert and oriented to person, place, and time.  Skin: Skin is warm and dry.  Psychiatric: She has a normal mood and affect. Her behavior is normal.    Ortho Exam right fifth toe is nontender.  There is external rotation, extension deformity of 40 degrees and lateral deviation 40 degrees of the proximal phalanx.  Good capillary refill sensation of the tip the toes normal.  Specialty Comments:  No specialty comments available.  Imaging: Xr Toe 5th Right  Result Date: 08/08/2018 Three-view x-rays right fifth toe obtained and reviewed.  This shows healing of the proximal phalanx fracture with valgus and extension deformity.  Valgus is  35 degrees extension is 40 degrees with rotation.  Adjacent fourth toe is normal. Impression: Healed fifth toe proximal phalanx fracture with malunion    PMFS History: Patient Active Problem List   Diagnosis Date Noted  . CKD (chronic kidney disease), stage III ([facility]) 10/24/2017  . Hyperkalemia 03/24/2016  . Dizziness and giddiness 03/06/2015  . GERD (gastroesophageal reflux disease) 01/08/2014  . Cardiomyopathy 05/16/2012  . Neck pain on right side 04/20/2012  . Multinodular goiter 04/18/2012  . 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 Palpitations 03/07/2012  .  Abdominal bruit 03/07/2012  . Hypertension 02/23/2012  . History of kidney cancer   . AC (acromioclavicular) joint bone spurs   . Death of family member   . Vitamin D deficiency 11/19/2007   Past Medical History:  Diagnosis Date  . AC (acromioclavicular) joint bone spurs    HEEL BONE SPURS-SURGERY 12/2010 LEFT FOOT  . Antral gastritis   . Cancer ([facility])   . DDD (degenerative disc disease), cervical   . Esophageal dysmotility   . GERD (gastroesophageal reflux disease)   . Heart palpitations   . Hiatal hernia   . Hypertension   . LGSIL (low grade squamous intraepithelial dysplasia) 2013    C&B Inadequate ECC--positive LGSIL --  . Multinodular goiter    RIGHT LOBE-- PER BX NEGATIVE  . Positive H. pylori test   . Vitamin D deficiency 11/2007   LOW AT 18    Family History  Problem Relation Age of Onset  . Hypertension Mother   . Hypertension Sister   . Hypertension Son   . Colon cancer Neg Hx   . Esophageal cancer Neg Hx   . Rectal cancer Neg Hx   . Stomach cancer Neg Hx   . Breast cancer Neg Hx     Past Surgical History:  Procedure Laterality Date  . ABDOMINAL HYSTERECTOMY N/A 05/28/2013   Procedure: HYSTERECTOMY ABDOMINAL;  Surgeon: Anastasio Auerbach, MD;  Location: [facility];  Service: Gynecology;  Laterality: N/A;  . BIOPSY THYROID Right 2014   ENLARGED THYROID NO MEDS BENIGN  . CERVICAL BIOPSY  W/ LOOP ELECTRODE EXCISION  10/2012   for persistent low-grade dysplasia  . CESAREAN SECTION  1993   W/ TUBAL LIGATION, BILATERAL  . CYSTOSCOPY N/A 05/28/2013   Procedure: CYSTOSCOPY;  Surgeon: Anastasio Auerbach, MD;  Location: [facility];  Service: Gynecology;  Laterality: N/A;  . esophogeal dilatation    . FOOT SURGERY  2012   left heel spur removed  . HYSTEROSCOPY W/D&C  06/08/2012   Procedure: DILATATION AND CURETTAGE /HYSTEROSCOPY;  Surgeon: Anastasio Auerbach, MD;  Location: [facility];  Service: Gynecology;  Laterality: N/A;  WITH RESECTION OF MYOMA  .  NEPHRECTOMY  2005   RIGHT KIDNEY CANCER  . ROTATOR CUFF REPAIR  2010   RIGHT  . s/p Right nephrectomy Right   . SALPINGOOPHORECTOMY Bilateral 05/28/2013   Procedure: BILATERAL SALPINGO OOPHORECTOMY;  Surgeon: Anastasio Auerbach, MD;  Location: [facility];  Service: Gynecology;  Laterality: Bilateral;  . TRANSTHORACIC ECHOCARDIOGRAM  04-10-2012  DR BRIEN CRENSHAW   LVSF MILDLY REDUCED/ EF 45-50%  . TUBAL LIGATION     Social History   Occupational History  . Occupation: [employer]    Employer: [employer]  Tobacco Use  . Smoking status: Never Smoker  . Smokeless tobacco: Never Used  Substance and Sexual Activity  . Alcohol use: Yes    Alcohol/week: 0.0 standard drinks    Comment: occasional wine  .  Drug use: No  . Sexual activity: Yes    Partners: Male    Birth control/protection: Post-menopausal    Comment: INTERCOURSE AGE 18, SEXUAL PARTNERS LESS THAN 5

## 2018-08-14 ENCOUNTER — Other Ambulatory Visit: Payer: Self-pay

## 2018-08-14 ENCOUNTER — Encounter (HOSPITAL_BASED_OUTPATIENT_CLINIC_OR_DEPARTMENT_OTHER): Payer: Self-pay | Admitting: *Deleted

## 2018-08-15 ENCOUNTER — Encounter (HOSPITAL_BASED_OUTPATIENT_CLINIC_OR_DEPARTMENT_OTHER)
Admission: RE | Disposition: A | Discharge status: Home / Self Care | Payer: Self-pay | Source: Ambulatory Visit | Attending: Orthopaedic Surgery

## 2018-08-15 ENCOUNTER — Encounter (HOSPITAL_BASED_OUTPATIENT_CLINIC_OR_DEPARTMENT_OTHER): Payer: Self-pay | Admitting: *Deleted

## 2018-08-15 ENCOUNTER — Other Ambulatory Visit: Payer: Self-pay

## 2018-08-15 ENCOUNTER — Ambulatory Visit (HOSPITAL_BASED_OUTPATIENT_CLINIC_OR_DEPARTMENT_OTHER)
Admission: RE | Admit: 2018-08-15 | Discharge: 2018-08-15 | Disposition: A | Discharge status: Home / Self Care | Payer: BLUE CROSS/BLUE SHIELD | Source: Ambulatory Visit | Attending: Orthopaedic Surgery | Admitting: Orthopaedic Surgery

## 2018-08-15 ENCOUNTER — Ambulatory Visit (HOSPITAL_BASED_OUTPATIENT_CLINIC_OR_DEPARTMENT_OTHER): Payer: BLUE CROSS/BLUE SHIELD | Admitting: Certified Registered"

## 2018-08-15 DIAGNOSIS — S92411P Displaced fracture of proximal phalanx of right great toe, subsequent encounter for fracture with malunion: Secondary | ICD-10-CM | POA: Diagnosis not present

## 2018-08-15 DIAGNOSIS — E669 Obesity, unspecified: Secondary | ICD-10-CM | POA: Insufficient documentation

## 2018-08-15 DIAGNOSIS — X58XXXD Exposure to other specified factors, subsequent encounter: Secondary | ICD-10-CM | POA: Diagnosis not present

## 2018-08-15 DIAGNOSIS — I10 Essential (primary) hypertension: Secondary | ICD-10-CM | POA: Diagnosis not present

## 2018-08-15 DIAGNOSIS — Z6832 Body mass index (BMI) 32.0-32.9, adult: Secondary | ICD-10-CM | POA: Insufficient documentation

## 2018-08-15 DIAGNOSIS — Z79899 Other long term (current) drug therapy: Secondary | ICD-10-CM | POA: Insufficient documentation

## 2018-08-15 DIAGNOSIS — S92511P Displaced fracture of proximal phalanx of right lesser toe(s), subsequent encounter for fracture with malunion: Secondary | ICD-10-CM | POA: Diagnosis not present

## 2018-08-15 DIAGNOSIS — I129 Hypertensive chronic kidney disease with stage 1 through stage 4 chronic kidney disease, or unspecified chronic kidney disease: Secondary | ICD-10-CM | POA: Diagnosis not present

## 2018-08-15 DIAGNOSIS — S92351P Displaced fracture of fifth metatarsal bone, right foot, subsequent encounter for fracture with malunion: Secondary | ICD-10-CM | POA: Diagnosis not present

## 2018-08-15 DIAGNOSIS — E559 Vitamin D deficiency, unspecified: Secondary | ICD-10-CM | POA: Diagnosis not present

## 2018-08-15 DIAGNOSIS — Z905 Acquired absence of kidney: Secondary | ICD-10-CM | POA: Diagnosis not present

## 2018-08-15 DIAGNOSIS — M79674 Pain in right toe(s): Secondary | ICD-10-CM | POA: Diagnosis not present

## 2018-08-15 DIAGNOSIS — N183 Chronic kidney disease, stage 3 (moderate): Secondary | ICD-10-CM | POA: Diagnosis not present

## 2018-08-15 DIAGNOSIS — K219 Gastro-esophageal reflux disease without esophagitis: Secondary | ICD-10-CM | POA: Diagnosis not present

## 2018-08-15 HISTORY — PX: ORIF TOE FRACTURE: SHX5032

## 2018-08-15 HISTORY — DX: Displaced unspecified fracture of left lesser toe(s), subsequent encounter for fracture with malunion: S92.502P

## 2018-08-15 SURGERY — OPEN REDUCTION INTERNAL FIXATION (ORIF) METATARSAL (TOE) FRACTURE
Anesthesia: General | Site: Foot | Laterality: Right

## 2018-08-15 MED ORDER — FENTANYL CITRATE (PF) 100 MCG/2ML IJ SOLN
INTRAMUSCULAR | Status: AC
  Filled 2018-08-15: qty 2

## 2018-08-15 MED ORDER — HYDROCODONE-ACETAMINOPHEN 5-325 MG PO TABS: 1.0000 | ORAL_TABLET | ORAL | 0 refills | Status: DC | PRN

## 2018-08-15 MED ORDER — MIDAZOLAM HCL 2 MG/2ML IJ SOLN
INTRAMUSCULAR | Status: AC
  Filled 2018-08-15: qty 2

## 2018-08-15 MED ORDER — LIDOCAINE 2% (20 MG/ML) 5 ML SYRINGE
INTRAMUSCULAR | Status: AC
  Filled 2018-08-15: qty 5

## 2018-08-15 MED ORDER — VANCOMYCIN HCL IN DEXTROSE 1-5 GM/200ML-% IV SOLN
INTRAVENOUS | Status: AC
  Filled 2018-08-15: qty 200

## 2018-08-15 MED ORDER — MIDAZOLAM HCL 2 MG/2ML IJ SOLN
1.0000 mg | INTRAMUSCULAR | Status: DC | PRN
  Administered 2018-08-15: 2 mg via INTRAVENOUS

## 2018-08-15 MED ORDER — FENTANYL CITRATE (PF) 100 MCG/2ML IJ SOLN
25.0000 ug | INTRAMUSCULAR | Status: DC | PRN
  Administered 2018-08-15 (×2): 50 ug via INTRAVENOUS

## 2018-08-15 MED ORDER — EPHEDRINE SULFATE 50 MG/ML IJ SOLN
INTRAMUSCULAR | Status: DC | PRN
  Administered 2018-08-15: 10 mg via INTRAVENOUS

## 2018-08-15 MED ORDER — LIDOCAINE HCL (CARDIAC) PF 100 MG/5ML IV SOSY
PREFILLED_SYRINGE | INTRAVENOUS | Status: DC | PRN
  Administered 2018-08-15: 100 mg via INTRAVENOUS

## 2018-08-15 MED ORDER — ONDANSETRON HCL 4 MG/2ML IJ SOLN
INTRAMUSCULAR | Status: DC | PRN
  Administered 2018-08-15: 4 mg via INTRAVENOUS

## 2018-08-15 MED ORDER — PROPOFOL 10 MG/ML IV BOLUS
INTRAVENOUS | Status: DC | PRN
  Administered 2018-08-15: 200 mg via INTRAVENOUS

## 2018-08-15 MED ORDER — FENTANYL CITRATE (PF) 100 MCG/2ML IJ SOLN
50.0000 ug | INTRAMUSCULAR | Status: DC | PRN
  Administered 2018-08-15 (×2): 50 ug via INTRAVENOUS

## 2018-08-15 MED ORDER — CHLORHEXIDINE GLUCONATE 4 % EX LIQD: 60.0000 mL | Freq: Once | CUTANEOUS | Status: DC

## 2018-08-15 MED ORDER — PROMETHAZINE HCL 25 MG/ML IJ SOLN: 6.2500 mg | INTRAMUSCULAR | Status: DC | PRN

## 2018-08-15 MED ORDER — VANCOMYCIN HCL IN DEXTROSE 1-5 GM/200ML-% IV SOLN
1000.0000 mg | INTRAVENOUS | Status: AC
  Administered 2018-08-15: 1000 mg via INTRAVENOUS

## 2018-08-15 MED ORDER — SCOPOLAMINE 1 MG/3DAYS TD PT72: 1.0000 | MEDICATED_PATCH | Freq: Once | TRANSDERMAL | Status: DC | PRN

## 2018-08-15 MED ORDER — DEXAMETHASONE SODIUM PHOSPHATE 10 MG/ML IJ SOLN
INTRAMUSCULAR | Status: DC | PRN
  Administered 2018-08-15: 10 mg via INTRAVENOUS

## 2018-08-15 MED ORDER — PROPOFOL 10 MG/ML IV BOLUS
INTRAVENOUS | Status: AC
  Filled 2018-08-15: qty 20

## 2018-08-15 MED ORDER — LACTATED RINGERS IV SOLN
INTRAVENOUS | Status: DC
  Administered 2018-08-15: 12:00:00 via INTRAVENOUS

## 2018-08-15 MED ORDER — BUPIVACAINE HCL 0.25 % IJ SOLN
INTRAMUSCULAR | Status: DC | PRN
  Administered 2018-08-15: 3 mL

## 2018-08-15 SURGICAL SUPPLY — 61 items
BANDAGE ESMARK 6X9 LF (GAUZE/BANDAGES/DRESSINGS) IMPLANT
BIT DRILL 1/16X5 DISP (BIT) ×2 IMPLANT
BLADE SURG 15 STRL LF DISP TIS (BLADE) ×1 IMPLANT
BLADE SURG 15 STRL SS (BLADE) ×6
BNDG CMPR 9X6 STRL LF SNTH (GAUZE/BANDAGES/DRESSINGS) ×1
BNDG COHESIVE 6X5 TAN STRL LF (GAUZE/BANDAGES/DRESSINGS) ×3 IMPLANT
BNDG CONFORM 3 STRL LF (GAUZE/BANDAGES/DRESSINGS) ×3 IMPLANT
BNDG ESMARK 6X9 LF (GAUZE/BANDAGES/DRESSINGS) ×3
BOOT STEPPER DURA MED (SOFTGOODS) ×2 IMPLANT
CANISTER SUCTION 1200CC (MISCELLANEOUS) ×2 IMPLANT
CAP PIN PROTECTOR ORTHO WHT (CAP) ×2 IMPLANT
CORD BIPOLAR FORCEPS 12FT (ELECTRODE) ×2 IMPLANT
COVER BACK TABLE 60X90IN (DRAPES) ×3 IMPLANT
CUFF TOURNIQUET SINGLE 24IN (TOURNIQUET CUFF) ×2 IMPLANT
CUFF TOURNIQUET SINGLE 34IN LL (TOURNIQUET CUFF) IMPLANT
DECANTER SPIKE VIAL GLASS SM (MISCELLANEOUS) IMPLANT
DRAPE EXTREMITY T 121X128X90 (DRAPE) ×3 IMPLANT
DRAPE IMP U-DRAPE 54X76 (DRAPES) ×3 IMPLANT
DRAPE OEC MINIVIEW 54X84 (DRAPES) ×2 IMPLANT
ELECT REM PT RETURN 9FT ADLT (ELECTROSURGICAL) ×3
ELECTRODE REM PT RTRN 9FT ADLT (ELECTROSURGICAL) ×1 IMPLANT
GAUZE SPONGE 4X4 12PLY STRL LF (GAUZE/BANDAGES/DRESSINGS) ×3 IMPLANT
GAUZE XEROFORM 1X8 LF (GAUZE/BANDAGES/DRESSINGS) ×3 IMPLANT
GLOVE BIO SURGEON STRL SZ8.5 (GLOVE) ×1 IMPLANT
GLOVE BIOGEL PI IND STRL 7.0 (GLOVE) IMPLANT
GLOVE BIOGEL PI IND STRL 8 (GLOVE) ×1 IMPLANT
GLOVE BIOGEL PI INDICATOR 7.0 (GLOVE) ×2
GLOVE BIOGEL PI INDICATOR 8 (GLOVE) ×4
GLOVE ECLIPSE 6.5 STRL STRAW (GLOVE) ×2 IMPLANT
GLOVE ORTHO TXT STRL SZ7.5 (GLOVE) ×3 IMPLANT
GOWN STRL REUS W/ TWL LRG LVL3 (GOWN DISPOSABLE) ×1 IMPLANT
GOWN STRL REUS W/ TWL XL LVL3 (GOWN DISPOSABLE) ×1 IMPLANT
GOWN STRL REUS W/TWL LRG LVL3 (GOWN DISPOSABLE) ×3
GOWN STRL REUS W/TWL XL LVL3 (GOWN DISPOSABLE) ×3
K-WIRE .045X4 (WIRE) ×2 IMPLANT
NDL HYPO 25X1 1.5 SAFETY (NEEDLE) IMPLANT
NEEDLE HYPO 25X1 1.5 SAFETY (NEEDLE) ×3 IMPLANT
NS IRRIG 1000ML POUR BTL (IV SOLUTION) ×3 IMPLANT
PACK BASIN DAY SURGERY FS (CUSTOM PROCEDURE TRAY) ×3 IMPLANT
PAD CAST 4YDX4 CTTN HI CHSV (CAST SUPPLIES) ×1 IMPLANT
PADDING CAST ABS 4INX4YD NS (CAST SUPPLIES)
PADDING CAST ABS COTTON 4X4 ST (CAST SUPPLIES) ×1 IMPLANT
PADDING CAST COTTON 4X4 STRL (CAST SUPPLIES) ×3
PADDING CAST COTTON 6X4 STRL (CAST SUPPLIES) ×1 IMPLANT
PENCIL BUTTON HOLSTER BLD 10FT (ELECTRODE) ×3 IMPLANT
SPLINT PLASTER CAST XFAST 3X15 (CAST SUPPLIES) IMPLANT
SPLINT PLASTER XTRA FASTSET 3X (CAST SUPPLIES)
SPONGE LAP 4X18 RFD (DISPOSABLE) ×2 IMPLANT
SUCTION FRAZIER HANDLE 10FR (MISCELLANEOUS) ×2
SUCTION TUBE FRAZIER 10FR DISP (MISCELLANEOUS) ×1 IMPLANT
SUT ETHILON 3 0 PS 1 (SUTURE) ×1 IMPLANT
SUT ETHILON 4 0 PS 2 18 (SUTURE) ×3 IMPLANT
SUT VIC AB 2-0 SH 27 (SUTURE)
SUT VIC AB 2-0 SH 27XBRD (SUTURE) ×1 IMPLANT
SYR BULB 3OZ (MISCELLANEOUS) ×2 IMPLANT
SYR CONTROL 10ML LL (SYRINGE) IMPLANT
TOWEL GREEN STERILE FF (TOWEL DISPOSABLE) ×6 IMPLANT
TOWEL OR NON WOVEN STRL DISP B (DISPOSABLE) ×3 IMPLANT
TUBE CONNECTING 20'X1/4 (TUBING) ×1
TUBE CONNECTING 20X1/4 (TUBING) ×2 IMPLANT
UNDERPAD 30X30 (UNDERPADS AND DIAPERS) ×3 IMPLANT

## 2018-08-15 NOTE — Anesthesia Postprocedure Evaluation (Signed)
Anesthesia Post Note  Patient: Cynthia Berger  Procedure(s) Performed: RIGHT FIFTH TOE OSTEOTOMY AND PINNING (Right Foot)     Patient location during evaluation: PACU Anesthesia Type: General Level of consciousness: awake and alert, awake and oriented Pain management: pain level controlled Vital Signs Assessment: post-procedure vital signs reviewed and stable Respiratory status: spontaneous breathing, nonlabored ventilation and respiratory function stable Cardiovascular status: blood pressure returned to baseline and stable Postop Assessment: no apparent nausea or vomiting Anesthetic complications: no    Last Vitals:  Vitals:   08/15/18 1530 08/15/18 1551  BP: (!) 146/87 (!) 137/94  Pulse: 61 64  Resp: 14 18  Temp:  36.5 C  SpO2: 100% 99%    Last Pain:  Vitals:   08/15/18 1551  TempSrc:   PainSc: 3                  Catalina Gravel

## 2018-08-15 NOTE — Transfer of Care (Signed)
Immediate Anesthesia Transfer of Care Note  Patient: Cynthia Berger  Procedure(s) Performed: RIGHT FIFTH TOE OSTEOTOMY AND PINNING (Right Foot)  Patient Location: PACU  Anesthesia Type:General  Level of Consciousness: awake, alert  and oriented  Airway & Oxygen Therapy: Patient Spontanous Breathing and Patient connected to face mask oxygen  Post-op Assessment: Report given to RN and Post -op Vital signs reviewed and stable  Post vital signs: Reviewed and stable  Last Vitals:  Vitals Value Taken Time  BP    Temp    Pulse    Resp    SpO2      Last Pain:  Vitals:   08/15/18 1148  TempSrc: Oral  PainSc: 0-No pain         Complications: No apparent anesthesia complications

## 2018-08-15 NOTE — Anesthesia Procedure Notes (Signed)
Procedure Name: LMA Insertion Performed by: Maryagnes Carrasco M, CRNA Pre-anesthesia Checklist: Patient identified, Emergency Drugs available, Suction available, Patient being monitored and Timeout performed Patient Re-evaluated:Patient Re-evaluated prior to induction Oxygen Delivery Method: Circle system utilized Preoxygenation: Pre-oxygenation with 100% oxygen Induction Type: IV induction LMA: LMA inserted LMA Size: 4.0 Tube type: Oral Placement Confirmation: CO2 detector,  positive ETCO2 and breath sounds checked- equal and bilateral Tube secured with: Tape Dental Injury: Teeth and Oropharynx as per pre-operative assessment        

## 2018-08-15 NOTE — Discharge Instructions (Signed)
Elevate foot. See Dr. Lorin Mercy in one week. Use boot.    Post Anesthesia Home Care Instructions  Activity: Get plenty of rest for the remainder of the day. A responsible individual must stay with you for 24 hours following the procedure.  For the next 24 hours, DO NOT: -Drive a car -Paediatric nurse -Drink alcoholic beverages -Take any medication unless instructed by your physician -Make any legal decisions or sign important papers.  Meals: Start with liquid foods such as gelatin or soup. Progress to regular foods as tolerated. Avoid greasy, spicy, heavy foods. If nausea and/or vomiting occur, drink only clear liquids until the nausea and/or vomiting subsides. Call your physician if vomiting continues.  Special Instructions/Symptoms: Your throat may feel dry or sore from the anesthesia or the breathing tube placed in your throat during surgery. If this causes discomfort, gargle with warm salt water. The discomfort should disappear within 24 hours.  If you had a scopolamine patch placed behind your ear for the management of post- operative nausea and/or vomiting:  1. The medication in the patch is effective for 72 hours, after which it should be removed.  Wrap patch in a tissue and discard in the trash. Wash hands thoroughly with soap and water. 2. You may remove the patch earlier than 72 hours if you experience unpleasant side effects which may include dry mouth, dizziness or visual disturbances. 3. Avoid touching the patch. Wash your hands with soap and water after contact with the patch.

## 2018-08-15 NOTE — H&P (Signed)
Cynthia Berger Media is an 62 y.o. female.   Chief Complaint: Right fifth toe pain and deformity HPI: Patient who is status post right fifth toe fracture and has the above complaint presents for surgical intervention.  Past Medical History:  Diagnosis Date  . AC (acromioclavicular) joint bone spurs    HEEL BONE SPURS-SURGERY 12/2010 LEFT FOOT  . Antral gastritis   . Cancer ([place])   . 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 DDD (degenerative disc disease), cervical   . Esophageal dysmotility   . GERD (gastroesophageal reflux disease)   . Heart palpitations   . Hiatal hernia   . Hypertension   . LGSIL (low grade squamous intraepithelial dysplasia) 2013    C&B Inadequate ECC--positive LGSIL --  . Multinodular goiter    RIGHT LOBE-- PER BX NEGATIVE  . Positive H. pylori test   . Vitamin D deficiency 11/2007   LOW AT 18    Past Surgical History:  Procedure Laterality Date  . ABDOMINAL HYSTERECTOMY N/A 05/28/2013   Procedure: HYSTERECTOMY ABDOMINAL;  Surgeon: Anastasio Auerbach, MD;  Location: [facility];  Service: Gynecology;  Laterality: N/A;  . BIOPSY THYROID Right 2014   ENLARGED THYROID NO MEDS BENIGN  . CERVICAL BIOPSY  W/ LOOP ELECTRODE EXCISION  10/2012   for persistent low-grade dysplasia  . CESAREAN SECTION  1993   W/ TUBAL LIGATION, BILATERAL  . CYSTOSCOPY N/A 05/28/2013   Procedure: CYSTOSCOPY;  Surgeon: Anastasio Auerbach, MD;  Location: [facility];  Service: Gynecology;  Laterality: N/A;  . esophogeal dilatation    . FOOT SURGERY  2012   left heel spur removed  . HYSTEROSCOPY W/D&C  06/08/2012   Procedure: DILATATION AND CURETTAGE /HYSTEROSCOPY;  Surgeon: Anastasio Auerbach, MD;  Location: [facility];  Service: Gynecology;  Laterality: N/A;  WITH RESECTION OF MYOMA  . NEPHRECTOMY  2005   RIGHT KIDNEY CANCER  . ROTATOR CUFF REPAIR  2010   RIGHT  . s/p Right nephrectomy Right   . SALPINGOOPHORECTOMY Bilateral 05/28/2013   Procedure:  BILATERAL SALPINGO OOPHORECTOMY;  Surgeon: Anastasio Auerbach, MD;  Location: [facility];  Service: Gynecology;  Laterality: Bilateral;  . TRANSTHORACIC ECHOCARDIOGRAM  04-10-2012  DR BRIEN CRENSHAW   LVSF MILDLY REDUCED/ EF 45-50%  . TUBAL LIGATION      Family History  Problem Relation Age of Onset  . Hypertension Mother   . Hypertension Sister   . Hypertension Son   . Colon cancer Neg Hx   . Esophageal cancer Neg Hx   . Rectal cancer Neg Hx   . Stomach cancer Neg Hx   . Breast cancer Neg Hx    Social History:  reports that she has never smoked. She has never used smokeless tobacco. She reports that she drank alcohol. She reports that she does not use drugs.  Allergies:  Allergies  Allergen Reactions  . Penicillins Rash    Medications Prior to Admission  Medication Sig Dispense Refill  . amLODipine (NORVASC) 5 MG tablet Take 1 tablet (5 mg total) daily by mouth. 90 tablet 3  . cholecalciferol (VITAMIN D) 1000 UNITS tablet Take 1,000 Units by mouth daily.    . metoprolol succinate (TOPROL-XL) 25 MG 24 hr tablet Take 1 tablet (25 mg total) daily by mouth. 90 tablet 3  . Multiple Vitamins-Minerals (MULTIVITAMIN PO) Take 1 tablet by mouth daily.     Marland Kitchen OVER THE COUNTER MEDICATION Probiotic capsule. One capsule  daily.      No results found for this or any previous visit (from the past 48 hour(s)). No results found.  Review of Systems  Constitutional: Negative.   HENT: Negative.   Respiratory: Negative.   Cardiovascular: Negative.   Gastrointestinal: Negative.   Genitourinary: Negative.   Musculoskeletal: Negative.   Skin: Negative.   Neurological: Negative.   Psychiatric/Behavioral: Negative.     Blood pressure (!) 151/85, pulse 62, temperature 97.6 F (36.4 C), temperature source Oral, resp. rate 20, height 5\' 3"  (1.6 m), weight 82.6 kg, last menstrual period 08/23/2006, SpO2 100 %. Physical Exam  Constitutional: No distress.  HENT:  Head: Normocephalic.  Eyes: Pupils  are equal, round, and reactive to light. EOM are normal.  Respiratory: No respiratory distress.  GI: She exhibits no distension.  Musculoskeletal: She exhibits tenderness.  Right fifth toe  Skin: Skin is warm and dry.  Psychiatric: She has a normal mood and affect.     Assessment/Plan Right fifth toe fracture and deformity.  Patient seen and examined by Dr. Lorin Mercy.   will proceed with right fifth toe osteotomy and pinning as scheduled.  Dr. Lorin Mercy  discussed surgical procedure along with rehab/recovery time.  All questions answered.  Benjiman Core, PA-C 08/15/2018, 12:21 PM

## 2018-08-15 NOTE — Anesthesia Preprocedure Evaluation (Addendum)
Anesthesia Evaluation  Patient identified by MRN, date of birth, ID band Patient awake    Reviewed: Allergy & Precautions, NPO status , Patient's Chart, lab work & pertinent test results, reviewed documented beta blocker date and time   History of Anesthesia Complications Negative for: history of anesthetic complications  Airway Mallampati: II  TM Distance: >3 FB Neck ROM: Full    Dental  (+) Teeth Intact, Dental Advisory Given, Chipped,    Pulmonary neg pulmonary ROS,    Pulmonary exam normal breath sounds clear to auscultation       Cardiovascular hypertension, Pt. on medications and Pt. on home beta blockers Normal cardiovascular exam Rhythm:Regular Rate:Normal     Neuro/Psych negative neurological ROS  negative psych ROS   GI/Hepatic Neg liver ROS, hiatal hernia, GERD  ,  Endo/Other  Obesity   Renal/GU Renal InsufficiencyRenal disease     Musculoskeletal  (+) Arthritis ,   Abdominal   Peds  Hematology negative hematology ROS (+)   Anesthesia Other Findings Day of surgery medications reviewed with the patient.  Reproductive/Obstetrics                            Anesthesia Physical Anesthesia Plan  ASA: II  Anesthesia Plan: General   Post-op Pain Management:    Induction: Intravenous  PONV Risk Score and Plan: 3 and Midazolam, Dexamethasone and Ondansetron  Airway Management Planned: LMA  Additional Equipment:   Intra-op Plan:   Post-operative Plan: Extubation in OR  Informed Consent: I have reviewed the patients History and Physical, chart, labs and discussed the procedure including the risks, benefits and alternatives for the proposed anesthesia with the patient or authorized representative who has indicated his/her understanding and acceptance.   Dental advisory given  Plan Discussed with: CRNA  Anesthesia Plan Comments:         Anesthesia Quick  Evaluation

## 2018-08-15 NOTE — Op Note (Signed)
Preop diagnosis: Malunion right fifth toe proximal phalanx  Postop diagnosis: Same  Procedure: Right fifth toe proximal phalanx osteotomy and internal fixation.  Surgeon: Rodell Perna, MD  Anesthesia: General +4 cc Marcaine local  Brief history 62 year old female broke toe against some furniture buddy taped to the fourth toe and fracture healed with flexion internal rotation and abduction.  Toes in the way difficult to put on socks wear shoes.  She had problems with sound aware with the toe hanging out away from the side of the sandal.  Procedure after induction of anesthesia Tourniquet application prepping with DuraPrep preoperative vancomycin due to the patient's penicillin allergy stockinette extremity sheets and drapes were applied sterile skin marker was used for a dorsal incision.  Patient had lateral angulation of the toe plus rotational deformity and also flexion.  Extensor tendon was visualized small's obtain his veins that were transfers were coagulated with bipolar cautery.  Extensor tendon was split in the midline and using a Soil scientist the proximal phalanx shaft was periosteally stripped exposed and tiny Hohmann retractors were placed on each side of the shaft.  Fracture had healed which was an oblique fracture with rotation and angulation.  Using the smallest drill bit and K wire driver multiple holes were drilled and oblique fashion following the fracture pattern previously.  Small bone biter was used to connect the holes and complete the osteotomy and then edges of the distal phalanx laterally were trimmed as well as medial fragment proximally close to 90degree angle.  Over 5 K wire was drilled first anterograde out to the tip of the toe inferior to the nail plate and bed and then once ankle alignment was fixed backwards across the the proximal phalanx at the osteotomy site.  He was checked under fluoroscopy was still angulated pin was backed out continue debridement of the bone edges  correct angulation.  Stabilization of the osteotomy with a hemostat and then advancing the pin across pinning across the metatarsal phalangeal joint and the metatarsal head for additional stabilization.  Rotation of the toe looked good.  AP lateral scopic pictures showed satisfactory position alignment.  Tourniquet was deflated irrigation with saline solution review approximation of the extensor tendon.  Skin closure with 4-0 nylon sutures.  Pin was bent 90 degrees pin protector applied Xeroform postop dressing and then cam boot was applied.  Patient tolerated procedure well.

## 2018-08-16 ENCOUNTER — Encounter (HOSPITAL_BASED_OUTPATIENT_CLINIC_OR_DEPARTMENT_OTHER): Payer: Self-pay | Admitting: Orthopaedic Surgery

## 2018-08-21 DIAGNOSIS — N3010 Interstitial cystitis (chronic) without hematuria: Secondary | ICD-10-CM | POA: Diagnosis not present

## 2018-08-24 ENCOUNTER — Ambulatory Visit (INDEPENDENT_AMBULATORY_CARE_PROVIDER_SITE_OTHER): Payer: BLUE CROSS/BLUE SHIELD | Admitting: Orthopaedic Surgery

## 2018-08-24 ENCOUNTER — Ambulatory Visit (INDEPENDENT_AMBULATORY_CARE_PROVIDER_SITE_OTHER): Payer: Self-pay

## 2018-08-24 ENCOUNTER — Encounter (INDEPENDENT_AMBULATORY_CARE_PROVIDER_SITE_OTHER): Payer: Self-pay | Admitting: Orthopaedic Surgery

## 2018-08-24 VITALS — BP 134/80 | HR 65 | Ht 63.0 in | Wt 170.0 lb

## 2018-08-24 DIAGNOSIS — S92501D Displaced unspecified fracture of right lesser toe(s), subsequent encounter for fracture with routine healing: Secondary | ICD-10-CM

## 2018-08-24 NOTE — Progress Notes (Signed)
Post-Op Visit Note   Patient: Cynthia Berger           Date of Birth: 1962-11-09           MRN: [id number] Visit Date: 08/24/2018 PCP: Martinique, Betty G, MD   Assessment & Plan: Post right fifth toe osteotomy for malunion.  Incision looks good.  Recheck 3 weeks for suture removal and repeat three-view x-rays to assess healing on return.  Plan is to hopefully keep the pin in 6 weeks.  Chief Complaint:  Chief Complaint  Patient presents with  . Right 5th Toe - Routine Post Op    08/15/18 Right 5th toe osteotomy and pinning   Visit Diagnoses:  1. Closed displaced fracture of phalanx of lesser toe of right foot with routine healing, unspecified phalanx, subsequent encounter     Plan: Recheck 3weeks.  X-rays as above and also suture removal.  Follow-Up Instructions: No follow-ups on file.   Orders:  Orders Placed This Encounter  Procedures  . XR Toe 5th Right   No orders of the defined types were placed in this encounter.   Imaging: No results found.  PMFS History: Patient Active Problem List   Diagnosis Date Noted  . Displaced fracture of proximal phalanx of right great toe with malunion 08/08/2018  . CKD (chronic kidney disease), stage III ([place]) 10/24/2017  . Hyperkalemia 03/24/2016  . Dizziness and giddiness 03/06/2015  . GERD (gastroesophageal reflux disease) 01/08/2014  . Cardiomyopathy 05/16/2012  . Neck pain on right side 04/20/2012  . Multinodular goiter 04/18/2012  . 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 Palpitations 03/07/2012  . Abdominal bruit 03/07/2012  . Hypertension 02/23/2012  . History of kidney cancer   . AC (acromioclavicular) joint bone spurs   . Death of family member   . Vitamin D deficiency 11/19/2007   Past Medical History:  Diagnosis Date  . AC (acromioclavicular) joint bone spurs    HEEL BONE SPURS-SURGERY 12/2010 LEFT FOOT  . Antral gastritis   . Cancer ([place])   . 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 DDD  (degenerative disc disease), cervical   . Esophageal dysmotility   . GERD (gastroesophageal reflux disease)   . Heart palpitations   . Hiatal hernia   . Hypertension   . LGSIL (low grade squamous intraepithelial dysplasia) 2013    C&B Inadequate ECC--positive LGSIL --  . Multinodular goiter    RIGHT LOBE-- PER BX NEGATIVE  . Positive H. pylori test   . Vitamin D deficiency 11/2007   LOW AT 18    Family History  Problem Relation Age of Onset  . Hypertension Mother   . Hypertension Sister   . Hypertension Son   . Colon cancer Neg Hx   . Esophageal cancer Neg Hx   . Rectal cancer Neg Hx   . Stomach cancer Neg Hx   . Breast cancer Neg Hx     Past Surgical History:  Procedure Laterality Date  . ABDOMINAL HYSTERECTOMY N/A 05/28/2013   Procedure: HYSTERECTOMY ABDOMINAL;  Surgeon: Anastasio Auerbach, MD;  Location: [facility];  Service: Gynecology;  Laterality: N/A;  . BIOPSY THYROID Right 2014   ENLARGED THYROID NO MEDS BENIGN  . CERVICAL BIOPSY  W/ LOOP ELECTRODE EXCISION  10/2012   for persistent low-grade dysplasia  . CESAREAN SECTION  1993   W/ TUBAL LIGATION, BILATERAL  . CYSTOSCOPY N/A 05/28/2013   Procedure: CYSTOSCOPY;  Surgeon: Anastasio Auerbach, MD;  Location: [facility];  Service: Gynecology;  Laterality: N/A;  . esophogeal dilatation    . FOOT SURGERY  2012   left heel spur removed  . HYSTEROSCOPY W/D&C  06/08/2012   Procedure: DILATATION AND CURETTAGE /HYSTEROSCOPY;  Surgeon: Anastasio Auerbach, MD;  Location: [facility];  Service: Gynecology;  Laterality: N/A;  WITH RESECTION OF MYOMA  . NEPHRECTOMY  2005   RIGHT KIDNEY CANCER  . ORIF TOE FRACTURE Right 08/15/2018   Procedure: RIGHT FIFTH TOE OSTEOTOMY AND PINNING;  Surgeon: Marybelle Killings, MD;  Location: [facility];  Service: Orthopedics;  Laterality: Right;  . ROTATOR CUFF REPAIR  2010   RIGHT  . s/p Right nephrectomy Right   . SALPINGOOPHORECTOMY Bilateral 05/28/2013   Procedure: BILATERAL  SALPINGO OOPHORECTOMY;  Surgeon: Anastasio Auerbach, MD;  Location: [facility];  Service: Gynecology;  Laterality: Bilateral;  . TRANSTHORACIC ECHOCARDIOGRAM  04-10-2012  DR BRIEN CRENSHAW   LVSF MILDLY REDUCED/ EF 45-50%  . TUBAL LIGATION     Social History   Occupational History  . Occupation: [employer]    Employer: [employer]  Tobacco Use  . Smoking status: Never Smoker  . Smokeless tobacco: Never Used  Substance and Sexual Activity  . Alcohol use: Not Currently    Alcohol/week: 0.0 standard drinks    Comment: occasional wine  . Drug use: No  . Sexual activity: Yes    Partners: Male    Birth control/protection: Post-menopausal    Comment: INTERCOURSE AGE 48, SEXUAL PARTNERS LESS THAN 5

## 2018-08-29 ENCOUNTER — Telehealth (INDEPENDENT_AMBULATORY_CARE_PROVIDER_SITE_OTHER): Payer: Self-pay

## 2018-08-29 NOTE — Telephone Encounter (Signed)
Please advise. Would you like patient to come in on Friday afternoon?

## 2018-08-29 NOTE — Telephone Encounter (Signed)
Patient called stating that the pin that she has in her right foot is further out than before and the pin is twisting.  Stated that her right foot has been irritated.  Would like to know if she needs to make an appointment to be seen.  Cb# is [phone].  Please advise.  Thank You

## 2018-08-29 NOTE — Telephone Encounter (Signed)
Yes thanks 

## 2018-08-30 NOTE — Telephone Encounter (Signed)
I left voicemail for patient to return my call--will work her in to the afternoon schedule tomorrow to see Dr. Lorin Mercy.

## 2018-08-30 NOTE — Telephone Encounter (Signed)
Appointment made for patient tomorrow afternoon.

## 2018-08-31 ENCOUNTER — Ambulatory Visit (INDEPENDENT_AMBULATORY_CARE_PROVIDER_SITE_OTHER): Payer: BLUE CROSS/BLUE SHIELD | Admitting: Orthopaedic Surgery

## 2018-08-31 ENCOUNTER — Ambulatory Visit (INDEPENDENT_AMBULATORY_CARE_PROVIDER_SITE_OTHER): Payer: Self-pay

## 2018-08-31 ENCOUNTER — Encounter (INDEPENDENT_AMBULATORY_CARE_PROVIDER_SITE_OTHER): Payer: Self-pay | Admitting: Orthopaedic Surgery

## 2018-08-31 VITALS — BP 168/81 | HR 63

## 2018-08-31 DIAGNOSIS — S92501D Displaced unspecified fracture of right lesser toe(s), subsequent encounter for fracture with routine healing: Secondary | ICD-10-CM

## 2018-08-31 NOTE — Progress Notes (Signed)
Post-Op Visit Note   Patient: Cynthia Berger           Date of Birth: 09-18-1956           MRN: [id number] Visit Date: 08/31/2018 PCP: Martinique, Betty G, MD   Assessment & Plan: Return in 2 weeks as planned.  No x-[place] needed on return.  Chief Complaint:  Chief Complaint  Patient presents with  . Right 5th Toe - Fracture    08/15/18 Right 5th toe osteotomy and pinning   Visit Diagnoses:  1. Closed displaced fracture of phalanx of lesser toe of right foot with routine healing, unspecified phalanx, subsequent encounter     Plan: Return in 2 weeks x-rays not needed on return.  We from the dorsum of her toe when she comes back and consider pin removal. Follow-Up Instructions: Return in about 2 weeks (around 09/14/2018).   Orders:  Orders Placed This Encounter  Procedures  . XR Toe 5th Right   No orders of the defined types were placed in this encounter.   Imaging: No results found.  PMFS History: Patient Active Problem List   Diagnosis Date Noted  . Displaced fracture of proximal phalanx of right great toe with malunion 08/08/2018  . CKD (chronic kidney disease), stage III ([place]) 10/24/2017  . Hyperkalemia 03/24/2016  . Dizziness and giddiness 03/06/2015  . GERD (gastroesophageal reflux disease) 01/08/2014  . Cardiomyopathy 05/16/2012  . Neck pain on right side 04/20/2012  . Multinodular goiter 04/18/2012  . 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 Palpitations 03/07/2012  . Abdominal bruit 03/07/2012  . Hypertension 02/23/2012  . History of kidney cancer   . AC (acromioclavicular) joint bone spurs   . Death of family member   . Vitamin D deficiency 11/19/2007   Past Medical History:  Diagnosis Date  . AC (acromioclavicular) joint bone spurs    HEEL BONE SPURS-SURGERY 12/2010 LEFT FOOT  . Antral gastritis   . Cancer ([place])   . 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 DDD (degenerative disc disease), cervical   . Esophageal dysmotility   . GERD  (gastroesophageal reflux disease)   . Heart palpitations   . Hiatal hernia   . Hypertension   . LGSIL (low grade squamous intraepithelial dysplasia) 2013    C&B Inadequate ECC--positive LGSIL --  . Multinodular goiter    RIGHT LOBE-- PER BX NEGATIVE  . Positive H. pylori test   . Vitamin D deficiency 11/2007   LOW AT 18    Family History  Problem Relation Age of Onset  . Hypertension Mother   . Hypertension Sister   . Hypertension Son   . Colon cancer Neg Hx   . Esophageal cancer Neg Hx   . Rectal cancer Neg Hx   . Stomach cancer Neg Hx   . Breast cancer Neg Hx     Past Surgical History:  Procedure Laterality Date  . ABDOMINAL HYSTERECTOMY N/A 05/28/2013   Procedure: HYSTERECTOMY ABDOMINAL;  Surgeon: Anastasio Auerbach, MD;  Location: [facility];  Service: Gynecology;  Laterality: N/A;  . BIOPSY THYROID Right 2014   ENLARGED THYROID NO MEDS BENIGN  . CERVICAL BIOPSY  W/ LOOP ELECTRODE EXCISION  10/2012   for persistent low-grade dysplasia  . CESAREAN SECTION  1993   W/ TUBAL LIGATION, BILATERAL  . CYSTOSCOPY N/A 05/28/2013   Procedure: CYSTOSCOPY;  Surgeon: Anastasio Auerbach, MD;  Location: [facility];  Service: Gynecology;  Laterality: N/A;  .  esophogeal dilatation    . FOOT SURGERY  2012   left heel spur removed  . HYSTEROSCOPY W/D&C  06/08/2012   Procedure: DILATATION AND CURETTAGE /HYSTEROSCOPY;  Surgeon: Anastasio Auerbach, MD;  Location: [facility];  Service: Gynecology;  Laterality: N/A;  WITH RESECTION OF MYOMA  . NEPHRECTOMY  2005   RIGHT KIDNEY CANCER  . ORIF TOE FRACTURE Right 08/15/2018   Procedure: RIGHT FIFTH TOE OSTEOTOMY AND PINNING;  Surgeon: Marybelle Killings, MD;  Location: [facility];  Service: Orthopedics;  Laterality: Right;  . ROTATOR CUFF REPAIR  2010   RIGHT  . s/p Right nephrectomy Right   . SALPINGOOPHORECTOMY Bilateral 05/28/2013   Procedure: BILATERAL SALPINGO OOPHORECTOMY;  Surgeon: Anastasio Auerbach, MD;  Location: [facility];   Service: Gynecology;  Laterality: Bilateral;  . TRANSTHORACIC ECHOCARDIOGRAM  04-10-2012  DR BRIEN CRENSHAW   LVSF MILDLY REDUCED/ EF 45-50%  . TUBAL LIGATION     Social History   Occupational History  . Occupation: [employer]    Employer: [employer]  Tobacco Use  . Smoking status: Never Smoker  . Smokeless tobacco: Never Used  Substance and Sexual Activity  . Alcohol use: Not Currently    Alcohol/week: 0.0 standard drinks    Comment: occasional wine  . Drug use: No  . Sexual activity: Yes    Partners: Male    Birth control/protection: Post-menopausal    Comment: INTERCOURSE AGE 2, SEXUAL PARTNERS LESS THAN 5

## 2018-09-14 ENCOUNTER — Encounter (INDEPENDENT_AMBULATORY_CARE_PROVIDER_SITE_OTHER): Payer: Self-pay | Admitting: Orthopaedic Surgery

## 2018-09-14 ENCOUNTER — Ambulatory Visit (INDEPENDENT_AMBULATORY_CARE_PROVIDER_SITE_OTHER): Payer: BLUE CROSS/BLUE SHIELD | Admitting: Orthopaedic Surgery

## 2018-09-14 VITALS — BP 135/81 | HR 59 | Ht 63.0 in | Wt 170.0 lb

## 2018-09-14 DIAGNOSIS — S92411P Displaced fracture of proximal phalanx of right great toe, subsequent encounter for fracture with malunion: Secondary | ICD-10-CM

## 2018-09-14 NOTE — Progress Notes (Signed)
Post-Op Visit Note   Patient: Cynthia Berger           Date of Birth: 09-01-1956           MRN: [id number] Visit Date: 09/14/2018 PCP: [place], Betty G, MD   Assessment & Plan: Pain and migrated in further and will rotate easily.  Pin was removed.  Sutures were harvested.  She is had some swelling in her toe.  Alignment looks good.  Return in 1 month for recheck no x-Renteria needed at that time.  Chief Complaint:  Chief Complaint  Patient presents with  . Right 5th Toe - Follow-up    08/15/18  Right 5th toe osteotomy and pinning   Visit Diagnoses:  1. Closed displaced fracture of proximal phalanx of right great toe with malunion, subsequent encounter     Plan: Pin removed sutures harvested returning follow-up exam in 1 month.  Follow-Up Instructions: No follow-ups on file.   Orders:  No orders of the defined types were placed in this encounter.  No orders of the defined types were placed in this encounter.   Imaging: No results found.  PMFS History: Patient Active Problem List   Diagnosis Date Noted  . Displaced fracture of proximal phalanx of right great toe with malunion 08/08/2018  . CKD (chronic kidney disease), stage III ([place]) 10/24/2017  . Hyperkalemia 03/24/2016  . Dizziness and giddiness 03/06/2015  . GERD (gastroesophageal reflux disease) 01/08/2014  . Cardiomyopathy 05/16/2012  . Neck pain on right side 04/20/2012  . Multinodular goiter 04/18/2012  . 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 Palpitations 03/07/2012  . Abdominal bruit 03/07/2012  . Hypertension 02/23/2012  . History of kidney cancer   . AC (acromioclavicular) joint bone spurs   . Death of family member   . Vitamin D deficiency 11/19/2007   Past Medical History:  Diagnosis Date  . AC (acromioclavicular) joint bone spurs    HEEL BONE SPURS-SURGERY 12/2010 LEFT FOOT  . Antral gastritis   . Cancer ([place])   . 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 DDD (degenerative disc  disease), cervical   . Esophageal dysmotility   . GERD (gastroesophageal reflux disease)   . Heart palpitations   . Hiatal hernia   . Hypertension   . LGSIL (low grade squamous intraepithelial dysplasia) 2013    C&B Inadequate ECC--positive LGSIL --  . Multinodular goiter    RIGHT LOBE-- PER BX NEGATIVE  . Positive H. pylori test   . Vitamin D deficiency 11/2007   LOW AT 18    Family History  Problem Relation Age of Onset  . Hypertension Mother   . Hypertension Sister   . Hypertension Son   . Colon cancer Neg Hx   . Esophageal cancer Neg Hx   . Rectal cancer Neg Hx   . Stomach cancer Neg Hx   . Breast cancer Neg Hx     Past Surgical History:  Procedure Laterality Date  . ABDOMINAL HYSTERECTOMY N/A 05/28/2013   Procedure: HYSTERECTOMY ABDOMINAL;  Surgeon: Anastasio Auerbach, MD;  Location: [facility];  Service: Gynecology;  Laterality: N/A;  . BIOPSY THYROID Right 2014   ENLARGED THYROID NO MEDS BENIGN  . CERVICAL BIOPSY  W/ LOOP ELECTRODE EXCISION  10/2012   for persistent low-grade dysplasia  . CESAREAN SECTION  1993   W/ TUBAL LIGATION, BILATERAL  . CYSTOSCOPY N/A 05/28/2013   Procedure: CYSTOSCOPY;  Surgeon: Anastasio Auerbach, MD;  Location:  [facility];  Service: Gynecology;  Laterality: N/A;  . esophogeal dilatation    . FOOT SURGERY  2012   left heel spur removed  . HYSTEROSCOPY W/D&C  06/08/2012   Procedure: DILATATION AND CURETTAGE /HYSTEROSCOPY;  Surgeon: Anastasio Auerbach, MD;  Location: [facility];  Service: Gynecology;  Laterality: N/A;  WITH RESECTION OF MYOMA  . NEPHRECTOMY  2005   RIGHT KIDNEY CANCER  . ORIF TOE FRACTURE Right 08/15/2018   Procedure: RIGHT FIFTH TOE OSTEOTOMY AND PINNING;  Surgeon: Marybelle Killings, MD;  Location: [facility];  Service: Orthopedics;  Laterality: Right;  . ROTATOR CUFF REPAIR  2010   RIGHT  . s/p Right nephrectomy Right   . SALPINGOOPHORECTOMY Bilateral 05/28/2013   Procedure: BILATERAL SALPINGO  OOPHORECTOMY;  Surgeon: Anastasio Auerbach, MD;  Location: [facility];  Service: Gynecology;  Laterality: Bilateral;  . TRANSTHORACIC ECHOCARDIOGRAM  04-10-2012  DR BRIEN CRENSHAW   LVSF MILDLY REDUCED/ EF 45-50%  . TUBAL LIGATION     Social History   Occupational History  . Occupation: [employer]    Employer: [employer]  Tobacco Use  . Smoking status: Never Smoker  . Smokeless tobacco: Never Used  Substance and Sexual Activity  . Alcohol use: Not Currently    Alcohol/week: 0.0 standard drinks    Comment: occasional wine  . Drug use: No  . Sexual activity: Yes    Partners: Male    Birth control/protection: Post-menopausal    Comment: INTERCOURSE AGE 62, SEXUAL PARTNERS LESS THAN 5

## 2018-10-12 ENCOUNTER — Ambulatory Visit (INDEPENDENT_AMBULATORY_CARE_PROVIDER_SITE_OTHER): Payer: BLUE CROSS/BLUE SHIELD | Admitting: Orthopaedic Surgery

## 2018-10-23 ENCOUNTER — Ambulatory Visit: Payer: BLUE CROSS/BLUE SHIELD | Admitting: Endocrinology

## 2018-10-23 DIAGNOSIS — Z0289 Encounter for other administrative examinations: Secondary | ICD-10-CM

## 2018-11-26 ENCOUNTER — Other Ambulatory Visit: Payer: Self-pay | Admitting: Women's Health

## 2018-11-26 DIAGNOSIS — Z1231 Encounter for screening mammogram for malignant neoplasm of breast: Secondary | ICD-10-CM

## 2018-11-30 ENCOUNTER — Ambulatory Visit: Payer: BLUE CROSS/BLUE SHIELD

## 2018-11-30 ENCOUNTER — Ambulatory Visit: Payer: BLUE CROSS/BLUE SHIELD | Admitting: Endocrinology

## 2018-12-03 ENCOUNTER — Other Ambulatory Visit: Payer: Self-pay | Admitting: Family Medicine

## 2018-12-03 DIAGNOSIS — I10 Essential (primary) hypertension: Secondary | ICD-10-CM

## 2018-12-13 DIAGNOSIS — C641 Malignant neoplasm of right kidney, except renal pelvis: Secondary | ICD-10-CM | POA: Diagnosis not present

## 2018-12-17 ENCOUNTER — Ambulatory Visit: Payer: BLUE CROSS/BLUE SHIELD | Admitting: Women's Health

## 2018-12-17 ENCOUNTER — Encounter: Payer: Self-pay | Admitting: Women's Health

## 2018-12-17 VITALS — BP 118/80 | Ht 63.0 in | Wt 182.0 lb

## 2018-12-17 DIAGNOSIS — Z01419 Encounter for gynecological examination (general) (routine) without abnormal findings: Secondary | ICD-10-CM | POA: Diagnosis not present

## 2018-12-17 NOTE — Patient Instructions (Signed)
Health Maintenance for Postmenopausal Women Menopause is a normal process in which your reproductive ability comes to an end. This process happens gradually over a span of months to years, usually between the ages of 62 and 89. Menopause is complete when you have missed 12 consecutive menstrual periods. It is important to talk with your health care provider about some of the most common conditions that affect postmenopausal women, such as heart disease, cancer, and bone loss (osteoporosis). Adopting a healthy lifestyle and getting preventive care can help to promote your health and wellness. Those actions can also lower your chances of developing some of these common conditions. What should I know about menopause? During menopause, you may experience a number of symptoms, such as:  Moderate-to-severe hot flashes.  Night sweats.  Decrease in sex drive.  Mood swings.  Headaches.  Tiredness.  Irritability.  Memory problems.  Insomnia. Choosing to treat or not to treat menopausal changes is an individual decision that you make with your health care provider. What should I know about hormone replacement therapy and supplements? Hormone therapy products are effective for treating symptoms that are associated with menopause, such as hot flashes and night sweats. Hormone replacement carries certain risks, especially as you become older. If you are thinking about using estrogen or estrogen with progestin treatments, discuss the benefits and risks with your health care provider. What should I know about heart disease and stroke? Heart disease, heart attack, and stroke become more likely as you age. This may be due, in part, to the hormonal changes that your body experiences during menopause. These can affect how your body processes dietary fats, triglycerides, and cholesterol. Heart attack and stroke are both medical emergencies. There are many things that you can do to help prevent heart disease  and stroke:  Have your blood pressure checked at least every 1-2 years. High blood pressure causes heart disease and increases the risk of stroke.  If you are 79-72 years old, ask your health care provider if you should take aspirin to prevent a heart attack or a stroke.  Do not use any tobacco products, including cigarettes, chewing tobacco, or electronic cigarettes. If you need help quitting, ask your health care provider.  It is important to eat a healthy diet and maintain a healthy weight. ? Be sure to include plenty of vegetables, fruits, low-fat dairy products, and lean protein. ? Avoid eating foods that are high in solid fats, added sugars, or salt (sodium).  Get regular exercise. This is one of the most important things that you can do for your health. ? Try to exercise for at least 150 minutes each week. The type of exercise that you do should increase your heart rate and make you sweat. This is known as moderate-intensity exercise. ? Try to do strengthening exercises at least twice each week. Do these in addition to the moderate-intensity exercise.  Know your numbers.Ask your health care provider to check your cholesterol and your blood glucose. Continue to have your blood tested as directed by your health care provider.  What should I know about cancer screening? There are several types of cancer. Take the following steps to reduce your risk and to catch any cancer development as early as possible. Breast Cancer  Practice breast self-awareness. ? This means understanding how your breasts normally appear and feel. ? It also means doing regular breast self-exams. Let your health care provider know about any changes, no matter how small.  If you are 40 or  older, have a clinician do a breast exam (clinical breast exam or CBE) every year. Depending on your age, family history, and medical history, it may be recommended that you also have a yearly breast X-Dosher (mammogram).  If you  have a family history of breast cancer, talk with your health care provider about genetic screening.  If you are at high risk for breast cancer, talk with your health care provider about having an MRI and a mammogram every year.  Breast cancer (BRCA) gene test is recommended for women who have family members with BRCA-related cancers. Results of the assessment will determine the need for genetic counseling and BRCA1 and for BRCA2 testing. BRCA-related cancers include these types: ? Breast. This occurs in males or females. ? Ovarian. ? Tubal. This may also be called fallopian tube cancer. ? Cancer of the abdominal or pelvic lining (peritoneal cancer). ? Prostate. ? Pancreatic. Cervical, Uterine, and Ovarian Cancer Your health care provider may recommend that you be screened regularly for cancer of the pelvic organs. These include your ovaries, uterus, and vagina. This screening involves a pelvic exam, which includes checking for microscopic changes to the surface of your cervix (Pap test).  For women ages 21-65, health care providers may recommend a pelvic exam and a Pap test every three years. For women ages 39-65, they may recommend the Pap test and pelvic exam, combined with testing for human papilloma virus (HPV), every five years. Some types of HPV increase your risk of cervical cancer. Testing for HPV may also be done on women of any age who have unclear Pap test results.  Other health care providers may not recommend any screening for nonpregnant women who are considered low risk for pelvic cancer and have no symptoms. Ask your health care provider if a screening pelvic exam is right for you.  If you have had past treatment for cervical cancer or a condition that could lead to cancer, you need Pap tests and screening for cancer for at least 20 years after your treatment. If Pap tests have been discontinued for you, your risk factors (such as having a new sexual partner) need to be reassessed  to determine if you should start having screenings again. Some women have medical problems that increase the chance of getting cervical cancer. In these cases, your health care provider may recommend that you have screening and Pap tests more often.  If you have a family history of uterine cancer or ovarian cancer, talk with your health care provider about genetic screening.  If you have vaginal bleeding after reaching menopause, tell your health care provider.  There are currently no reliable tests available to screen for ovarian cancer. Lung Cancer Lung cancer screening is recommended for adults 57-50 years old who are at high risk for lung cancer because of a history of smoking. A yearly low-dose CT scan of the lungs is recommended if you:  Currently smoke.  Have a history of at least 30 pack-years of smoking and you currently smoke or have quit within the past 15 years. A pack-year is smoking an average of one pack of cigarettes per day for one year. Yearly screening should:  Continue until it has been 15 years since you quit.  Stop if you develop a health problem that would prevent you from having lung cancer treatment. Colorectal Cancer  This type of cancer can be detected and can often be prevented.  Routine colorectal cancer screening usually begins at age 12 and continues through  age 75.  If you have risk factors for colon cancer, your health care provider may recommend that you be screened at an earlier age.  If you have a family history of colorectal cancer, talk with your health care provider about genetic screening.  Your health care provider may also recommend using home test kits to check for hidden blood in your stool.  A small camera at the end of a tube can be used to examine your colon directly (sigmoidoscopy or colonoscopy). This is done to check for the earliest forms of colorectal cancer.  Direct examination of the colon should be repeated every 5-10 years until  age 75. However, if early forms of precancerous polyps or small growths are found or if you have a family history or genetic risk for colorectal cancer, you may need to be screened more often. Skin Cancer  Check your skin from head to toe regularly.  Monitor any moles. Be sure to tell your health care provider: ? About any new moles or changes in moles, especially if there is a change in a mole's shape or color. ? If you have a mole that is larger than the size of a pencil eraser.  If any of your family members has a history of skin cancer, especially at a Nyomi Howser age, talk with your health care provider about genetic screening.  Always use sunscreen. Apply sunscreen liberally and repeatedly throughout the day.  Whenever you are outside, protect yourself by wearing long sleeves, pants, a wide-brimmed hat, and sunglasses. What should I know about osteoporosis? Osteoporosis is a condition in which bone destruction happens more quickly than new bone creation. After menopause, you may be at an increased risk for osteoporosis. To help prevent osteoporosis or the bone fractures that can happen because of osteoporosis, the following is recommended:  If you are 19-50 years old, get at least 1,000 mg of calcium and at least 600 mg of vitamin D per day.  If you are older than age 50 but younger than age 70, get at least 1,200 mg of calcium and at least 600 mg of vitamin D per day.  If you are older than age 70, get at least 1,200 mg of calcium and at least 800 mg of vitamin D per day. Smoking and excessive alcohol intake increase the risk of osteoporosis. Eat foods that are rich in calcium and vitamin D, and do weight-bearing exercises several times each week as directed by your health care provider. What should I know about how menopause affects my mental health? Depression may occur at any age, but it is more common as you become older. Common symptoms of depression include:  Low or sad  mood.  Changes in sleep patterns.  Changes in appetite or eating patterns.  Feeling an overall lack of motivation or enjoyment of activities that you previously enjoyed.  Frequent crying spells. Talk with your health care provider if you think that you are experiencing depression. What should I know about immunizations? It is important that you get and maintain your immunizations. These include:  Tetanus, diphtheria, and pertussis (Tdap) booster vaccine.  Influenza every year before the flu season begins.  Pneumonia vaccine.  Shingles vaccine. Your health care provider may also recommend other immunizations. This information is not intended to replace advice given to you by your health care provider. Make sure you discuss any questions you have with your health care provider. Document Released: 01/27/2006 Document Revised: 06/24/2016 Document Reviewed: 09/08/2015 Elsevier Interactive Patient Education    2019 [place].

## 2018-12-17 NOTE — Progress Notes (Signed)
Cynthia Berger Endo 14-Feb-1956 [id number]    History: 62 yo, MBF, G6P4, postmenopausal, with no bleeding or spotting, on no HRT presents for annual exam.  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 Normal mammogram history. 2015 normal DEXA. 2005 kidney cancer. Hypertension, GERD primary care manages. Goiter, endocrinologist manages. Completed doses of Shingrex. Weight is up 10 pounds from last year. Broken right toe, 05/2018, surgical repair with pin. Sexually active.   Past medical history, past surgical history, family history and social history were all reviewed and documented in the EPIC chart. Working part-time in [employer]. Going on a cruise to [place] and [place] in February. Exercises twice a week, aerobics videos. 4 children and 7 grandchildren, sees them frequently.  ROS:  A ROS was performed and pertinent positives and negatives are included.  Exam:  Vitals:   12/17/18 1602  BP: 118/80  Weight: 182 lb (82.6 kg)  Height: 5\' 3"  (1.6 m)   Body mass index is 32.24 kg/m.   General appearance:  Normal Thyroid:  Symmetrical, normal in size, without palpable masses or nodularity. Respiratory  Auscultation:  Clear without wheezing or rhonchi Cardiovascular  Auscultation:  Regular rate, without rubs, murmurs or gallops  Edema/varicosities:  Not grossly evident Abdominal  Soft,nontender, without masses, guarding or rebound.  Liver/spleen:  No organomegaly noted  Hernia:  None appreciated  Skin  Inspection:  Grossly normal   Breasts: Examined lying and sitting.     Right: Without masses, retractions, discharge or axillary adenopathy.     Left: Without masses, retractions, discharge or axillary adenopathy. Gentitourinary   Inguinal/mons:  Normal without inguinal adenopathy  External genitalia:  Normal  BUS/Urethra/Skene's glands:  Normal  Vagina:  Normal  Cervix:  Absent  Adnexa/parametria:     Rt: Without masses or tenderness.   Lt: Without  masses or tenderness.  Anus and perineum: Normal    Assessment:  62 y.o. MBF, G6P4,  for annual exam with no complaints.  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 Reviewed importance of continuing regular exercise, weightbearing, fall prevention discussed.  Pap.  Has gained 10 pounds in the past year, reviewed importance of decreasing calorie/carbs.   Huel Cote [facility], 4:41 PM 12/17/2018

## 2018-12-18 LAB — PAP IG W/ RFLX HPV ASCU

## 2018-12-19 HISTORY — PX: TOE SURGERY: SHX1073

## 2019-01-03 ENCOUNTER — Ambulatory Visit: Payer: 59 | Admitting: Adult Health

## 2019-01-03 ENCOUNTER — Encounter: Payer: Self-pay | Admitting: Adult Health

## 2019-01-03 VITALS — BP 130/78 | Temp 97.8°F | Wt 183.0 lb

## 2019-01-03 DIAGNOSIS — Z23 Encounter for immunization: Secondary | ICD-10-CM

## 2019-01-03 DIAGNOSIS — T148XXA Other injury of unspecified body region, initial encounter: Secondary | ICD-10-CM

## 2019-01-03 MED ORDER — CYCLOBENZAPRINE HCL 10 MG PO TABS: 10.0000 mg | ORAL_TABLET | Freq: Every day | ORAL | 0 refills | Status: AC

## 2019-01-03 NOTE — Progress Notes (Signed)
Subjective:    Patient ID: Cynthia Berger, female    DOB: 01/05/1956, 63 y.o.   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 pylori test, and Vitamin D deficiency (11/2007).  She presents to the office today for one week of " soreness" in her left neck, shoulder, and bicep. She reports that the aggravating factor was possibly picking up some heavy household items. She has been using warm compresses and noticed improvement. Denies chest pain, shortness of breath or feeling acutely ill.   She would also like her flu shot   Review of Systems See HPI   Past Medical History:  Diagnosis Date  . AC (acromioclavicular) joint bone spurs    HEEL BONE SPURS-SURGERY 12/2010 LEFT FOOT  . Antral gastritis   . Cancer ([place])   . 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 DDD (degenerative disc disease), cervical   . Esophageal dysmotility   . GERD (gastroesophageal reflux disease)   . Heart palpitations   . Hiatal hernia   . Hypertension   . LGSIL (low grade squamous intraepithelial dysplasia) 2013    C&B Inadequate ECC--positive LGSIL --  . Multinodular goiter    RIGHT LOBE-- PER BX NEGATIVE  . Positive H. pylori test   . Vitamin D deficiency 11/2007   LOW AT 18    Social History   Socioeconomic History  . Marital status: Married    Spouse name: Not on file  . Number of children: 4  . Years of education: Not on file  . Highest education level: Not on file  Occupational History  . Occupation: [employer]    Employer: [employer]  .  Financial resource strain: Not on file  . Food insecurity:    Worry: Not on file    Inability: Not on file  . Transportation needs:    Medical: Not on file    Non-medical: Not on file  Tobacco Use  . Smoking status: Never Smoker  . Smokeless tobacco: Never Used  Substance and Sexual Activity  . Alcohol use: Yes    Alcohol/week: 0.0 standard drinks    Comment: occasional wine  . Drug use: No  . Sexual activity: Yes    Partners: Male    Birth control/protection: Post-menopausal    Comment: INTERCOURSE AGE 31, SEXUAL PARTNERS LESS THAN 5  Lifestyle  . Physical activity:    Days per week: Not on file    Minutes per session: Not on file  . Stress: Not on file  Relationships  . Social connections:    Talks on phone: Not on file    Gets together: Not on file    Attends religious service: Not on file    Active member of club or organization: Not on file    Attends meetings of clubs or organizations: Not on file    Relationship status: Not on file  . Intimate partner violence:    Fear of current or ex partner: Not on file    Emotionally abused: Not on file    Physically abused: Not on file    Forced sexual activity:  Not on file  Other Topics Concern  . Not on file  Social History Narrative  . Not on file    Past Surgical History:  Procedure Laterality Date  . ABDOMINAL HYSTERECTOMY N/A 05/28/2013   Procedure: HYSTERECTOMY ABDOMINAL;  Surgeon: Anastasio Auerbach, MD;  Location: [facility];  Service: Gynecology;  Laterality: N/A;  . BIOPSY THYROID Right 2014   ENLARGED THYROID NO MEDS BENIGN  . CERVICAL BIOPSY  W/ LOOP ELECTRODE EXCISION  10/2012   for persistent low-grade dysplasia  . CESAREAN SECTION  1993   W/ TUBAL LIGATION, BILATERAL  . CYSTOSCOPY N/A 05/28/2013   Procedure: CYSTOSCOPY;  Surgeon: Anastasio Auerbach, MD;  Location: [facility];  Service: Gynecology;  Laterality: N/A;  . esophogeal dilatation    . FOOT SURGERY  2012   left heel spur removed  . HYSTEROSCOPY  W/D&C  06/08/2012   Procedure: DILATATION AND CURETTAGE /HYSTEROSCOPY;  Surgeon: Anastasio Auerbach, MD;  Location: [facility];  Service: Gynecology;  Laterality: N/A;  WITH RESECTION OF MYOMA  . NEPHRECTOMY  2005   RIGHT KIDNEY CANCER  . ORIF TOE FRACTURE Right 08/15/2018   Procedure: RIGHT FIFTH TOE OSTEOTOMY AND PINNING;  Surgeon: Marybelle Killings, MD;  Location: [facility];  Service: Orthopedics;  Laterality: Right;  . ROTATOR CUFF REPAIR  2010   RIGHT  . s/p Right nephrectomy Right   . SALPINGOOPHORECTOMY Bilateral 05/28/2013   Procedure: BILATERAL SALPINGO OOPHORECTOMY;  Surgeon: Anastasio Auerbach, MD;  Location: [facility];  Service: Gynecology;  Laterality: Bilateral;  . TRANSTHORACIC ECHOCARDIOGRAM  04-10-2012  DR BRIEN CRENSHAW   LVSF MILDLY REDUCED/ EF 45-50%  . TUBAL LIGATION      Family History  Problem Relation Age of Onset  . Hypertension Mother   . Hypertension Sister   . Hypertension Son   . Colon cancer Neg Hx   . Esophageal cancer Neg Hx   . Rectal cancer Neg Hx   . Stomach cancer Neg Hx   . Breast cancer Neg Hx     Allergies  Allergen Reactions  . Penicillins Rash    Current Outpatient Medications on File Prior to Visit  Medication Sig Dispense Refill  . amLODipine (NORVASC) 5 MG tablet TAKE 1 TABLET BY MOUTH ONCE DAILY 30 tablet 0  . cholecalciferol (VITAMIN D) 1000 UNITS tablet Take 1,000 Units by mouth daily.    . metoprolol succinate (TOPROL-XL) 25 MG 24 hr tablet TAKE 1 TABLET BY MOUTH ONCE DAILY 30 tablet 0  . Multiple Vitamins-Minerals (MULTIVITAMIN PO) Take 1 tablet by mouth daily.     Marland Kitchen OVER THE COUNTER MEDICATION Probiotic capsule. One capsule daily.    . [DISCONTINUED] dicyclomine (BENTYL) 20 MG tablet Take 1 tablet (20 mg total) by mouth 2 (two) times daily. 40 tablet 0   No current facility-administered medications on file prior to visit.     BP 130/78   Temp 97.8 F (36.6 C)   Wt 183 lb (83 kg)   LMP 08/23/2006    BMI 32.42 kg/m       Objective:   Physical Exam Vitals signs and nursing note reviewed.  Constitutional:      Appearance: Normal appearance.  Cardiovascular:     Rate and Rhythm: Normal rate and regular rhythm.     Pulses: Normal pulses.     Heart sounds: Normal heart sounds.  Pulmonary:     Effort: Pulmonary effort is normal.     Breath sounds: Normal breath  sounds.  Musculoskeletal: Normal range of motion.        General: No swelling, tenderness, deformity or signs of injury.     Right lower leg: No edema.     Left lower leg: No edema.     Comments: No tenderness with palpation but has discomfort with rotating head towards the right   Skin:    General: Skin is warm and dry.  Neurological:     General: No focal deficit present.     Mental Status: She is alert.       Assessment & Plan:  1. Muscle strain - Continue with heating pad as this seems to be working. She cannot take Nsaids due to having a single kidney.  - Will prescribe short course of flexeril QHS. Advised that this would likely make her sleepy.  - cyclobenzaprine (FLEXERIL) 10 MG tablet; Take 1 tablet (10 mg total) by mouth at bedtime for 5 days.  Dispense: 5 tablet; Refill: 0 - Follow up if not resolved   2. Need for influenza vaccination  - Flu Vaccine QUAD 6+ mos PF IM (Fluarix Quad PF)  Dorothyann Peng, NP

## 2019-01-07 ENCOUNTER — Ambulatory Visit: Payer: BLUE CROSS/BLUE SHIELD | Admitting: Endocrinology

## 2019-01-07 ENCOUNTER — Telehealth: Payer: Self-pay | Admitting: Endocrinology

## 2019-01-07 NOTE — Telephone Encounter (Signed)
Please schedule f/u appt for next available appointment  

## 2019-01-07 NOTE — Telephone Encounter (Signed)
Please refer to Dr. Ellison's response 

## 2019-01-07 NOTE — Telephone Encounter (Signed)
Pt is rescheduled for wednesday

## 2019-01-07 NOTE — Telephone Encounter (Signed)
Patient no showed today's appt. Please advise on how to follow up. °A. No follow up necessary. °B. Follow up urgent. Contact patient immediately. °C. Follow up necessary. Contact patient and schedule visit in ___ days. °D. Follow up advised. Contact patient and schedule visit in ____weeks. ° °Would you like the NS fee to be applied to this visit? ° °

## 2019-01-09 ENCOUNTER — Encounter: Payer: Self-pay | Admitting: Endocrinology

## 2019-01-09 ENCOUNTER — Ambulatory Visit (INDEPENDENT_AMBULATORY_CARE_PROVIDER_SITE_OTHER): Payer: 59 | Admitting: Endocrinology

## 2019-01-09 VITALS — BP 132/70 | HR 65 | Ht 63.0 in | Wt 183.6 lb

## 2019-01-09 DIAGNOSIS — E042 Nontoxic multinodular goiter: Secondary | ICD-10-CM | POA: Diagnosis not present

## 2019-01-09 LAB — TSH: TSH: 0.91 u[IU]/mL (ref 0.35–4.50)

## 2019-01-09 LAB — T4, FREE: Free T4: 0.88 ng/dL (ref 0.60–1.60)

## 2019-01-09 NOTE — Progress Notes (Signed)
Subjective:    Patient ID: Cynthia Berger, female    DOB: 07/07/1956, 63 y.o.   MRN: [id number]  HPI Pt returns for f/u of small multinodular goiter (dx'ed 2013; bx was low-risk; she has been euthyroid off rx; f/u US in 2017 was unchanged, and did not meet criteria for bx; TSH is low-normal).  She does not notice the goiter.  pt states she feels well in general.   Past Medical History:  Diagnosis Date  . AC (acromioclavicular) joint bone spurs    HEEL BONE SPURS-SURGERY 12/2010 LEFT FOOT  . Antral gastritis   . Cancer ([place])   . 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 DDD (degenerative disc disease), cervical   . Esophageal dysmotility   . GERD (gastroesophageal reflux disease)   . Heart palpitations   . Hiatal hernia   . Hypertension   . LGSIL (low grade squamous intraepithelial dysplasia) 2013    C&B Inadequate ECC--positive LGSIL --  . Multinodular goiter    RIGHT LOBE-- PER BX NEGATIVE  . Positive H. pylori test   . Vitamin D deficiency 11/2007   LOW AT 18    Past Surgical History:  Procedure Laterality Date  . ABDOMINAL HYSTERECTOMY N/A 05/28/2013   Procedure: HYSTERECTOMY ABDOMINAL;  Surgeon: Anastasio Auerbach, MD;  Location: [facility];  Service: Gynecology;  Laterality: N/A;  . BIOPSY THYROID Right 2014   ENLARGED THYROID NO MEDS BENIGN  . CERVICAL BIOPSY  W/ LOOP ELECTRODE EXCISION  10/2012   for persistent low-grade dysplasia  . CESAREAN SECTION  1993   W/ TUBAL LIGATION, BILATERAL  . CYSTOSCOPY N/A 05/28/2013   Procedure: CYSTOSCOPY;  Surgeon: Anastasio Auerbach, MD;  Location: [facility];  Service: Gynecology;  Laterality: N/A;  . esophogeal dilatation    . FOOT SURGERY  2012   left heel spur removed  . HYSTEROSCOPY W/D&C  06/08/2012   Procedure: DILATATION AND CURETTAGE /HYSTEROSCOPY;  Surgeon: Anastasio Auerbach, MD;  Location: [facility];  Service: Gynecology;  Laterality: N/A;  WITH RESECTION OF MYOMA  . NEPHRECTOMY  2005   RIGHT KIDNEY CANCER  . ORIF TOE FRACTURE Right 08/15/2018   Procedure: RIGHT FIFTH TOE OSTEOTOMY AND PINNING;  Surgeon: Marybelle Killings, MD;  Location: [facility];  Service: Orthopedics;  Laterality: Right;  . ROTATOR CUFF REPAIR  2010   RIGHT  . s/p Right nephrectomy Right   . SALPINGOOPHORECTOMY Bilateral 05/28/2013   Procedure: BILATERAL SALPINGO OOPHORECTOMY;  Surgeon: Anastasio Auerbach, MD;  Location: [facility];  Service: Gynecology;  Laterality: Bilateral;  . TRANSTHORACIC ECHOCARDIOGRAM  04-10-2012  DR BRIEN CRENSHAW   LVSF MILDLY REDUCED/ EF 45-50%  . TUBAL LIGATION      Social History   Socioeconomic History  . Marital status: Married    Spouse name: Not on file  . Number of children: 4  . Years of education: Not on file  . Highest education level: Not on file  Occupational History  . Occupation: [employer]    Employer: [employer]  . Financial resource strain: Not on file  . Food insecurity:    Worry: Not on file    Inability: Not on file  . Transportation needs:    Medical: Not on file    Non-medical: Not on file  Tobacco Use  . Smoking status: Never Smoker  . Smokeless tobacco: Never Used  Substance and Sexual Activity  .  Alcohol use: Yes    Alcohol/week: 0.0 standard drinks    Comment: occasional wine  . Drug use: No  . Sexual activity: Yes    Partners: Male    Birth control/protection: Post-menopausal    Comment: INTERCOURSE AGE 27, SEXUAL PARTNERS LESS THAN 5  Lifestyle  . Physical activity:    Days per week: Not on file    Minutes per session: Not on file  . Stress: Not on file  Relationships  . Social connections:    Talks on phone: Not on file    Gets together: Not on file    Attends religious service: Not on file    Active member of club or organization: Not on file    Attends meetings of clubs or organizations: Not on file    Relationship status: Not on file  . Intimate partner violence:    Fear of current or  ex partner: Not on file    Emotionally abused: Not on file    Physically abused: Not on file    Forced sexual activity: Not on file  Other Topics Concern  . Not on file  Social History Narrative  . Not on file    Current Outpatient Medications on File Prior to Visit  Medication Sig Dispense Refill  . amLODipine (NORVASC) 5 MG tablet TAKE 1 TABLET BY MOUTH ONCE DAILY 30 tablet 0  . cholecalciferol (VITAMIN D) 1000 UNITS tablet Take 1,000 Units by mouth daily.    . metoprolol succinate (TOPROL-XL) 25 MG 24 hr tablet TAKE 1 TABLET BY MOUTH ONCE DAILY 30 tablet 0  . Multiple Vitamins-Minerals (MULTIVITAMIN PO) Take 1 tablet by mouth daily.     Marland Kitchen OVER THE COUNTER MEDICATION Probiotic capsule. One capsule daily.    . [DISCONTINUED] dicyclomine (BENTYL) 20 MG tablet Take 1 tablet (20 mg total) by mouth 2 (two) times daily. 40 tablet 0   No current facility-administered medications on file prior to visit.     Allergies  Allergen Reactions  . Penicillins Rash    Family History  Problem Relation Age of Onset  . Hypertension Mother   . Hypertension Sister   . Hypertension Son   . Colon cancer Neg Hx   . Esophageal cancer Neg Hx   . Rectal cancer Neg Hx   . Stomach cancer Neg Hx   . Breast cancer Neg Hx     BP 132/70 (BP Location: Left Arm, Patient Position: Sitting, Cuff Size: Large)   Pulse 65   Ht 5\' 3"  (1.6 m)   Wt 183 lb 9.6 oz (83.3 kg)   LMP 08/23/2006   SpO2 98%   BMI 32.52 kg/m    Review of Systems Denies sob    Objective:   Physical Exam VITAL SIGNS:  See vs page GENERAL: no distress NECK: multinodular goiter, including the right nodule (appprox 4 cm), is again easily palpable.     Lab Results  Component Value Date   TSH 0.91 01/09/2019      Assessment & Plan:  Multinodular goiter, due for recheck Low-normal TSH: this reduces risk of malignancy.  Patient Instructions  blood tests are requested for you today.  We'll let you know about the results.    Let's recheck the ultrasound.  you will receive a phone call, about a day and time for an appointment. Please return in 1 year.   most of the time, a "lumpy thyroid" will eventually become overactive.  this is usually a slow process, happening over the  span of many years.

## 2019-01-09 NOTE — Patient Instructions (Addendum)
blood tests are requested for you today.  We'll let you know about the results.  Let's recheck the ultrasound.  you will receive a phone call, about a day and time for an appointment. Please return in 1 year.   most of the time, a "lumpy thyroid" will eventually become overactive.  this is usually a slow process, happening over the span of many years.

## 2019-01-22 ENCOUNTER — Ambulatory Visit
Admission: RE | Admit: 2019-01-22 | Discharge: 2019-01-22 | Disposition: A | Discharge status: Home / Self Care | Payer: 59 | Source: Ambulatory Visit | Attending: Endocrinology | Admitting: Endocrinology

## 2019-01-22 DIAGNOSIS — E042 Nontoxic multinodular goiter: Secondary | ICD-10-CM

## 2019-01-25 ENCOUNTER — Other Ambulatory Visit: Payer: Self-pay | Admitting: Family Medicine

## 2019-01-25 DIAGNOSIS — I10 Essential (primary) hypertension: Secondary | ICD-10-CM

## 2019-01-30 ENCOUNTER — Ambulatory Visit (INDEPENDENT_AMBULATORY_CARE_PROVIDER_SITE_OTHER): Payer: 59

## 2019-01-30 ENCOUNTER — Ambulatory Visit (INDEPENDENT_AMBULATORY_CARE_PROVIDER_SITE_OTHER): Payer: 59 | Admitting: Family Medicine

## 2019-01-30 ENCOUNTER — Encounter: Payer: Self-pay | Admitting: Family Medicine

## 2019-01-30 VITALS — BP 126/72 | HR 71 | Temp 98.5°F | Resp 12 | Ht 63.0 in | Wt 185.5 lb

## 2019-01-30 DIAGNOSIS — Z0000 Encounter for general adult medical examination without abnormal findings: Secondary | ICD-10-CM

## 2019-01-30 DIAGNOSIS — E559 Vitamin D deficiency, unspecified: Secondary | ICD-10-CM

## 2019-01-30 DIAGNOSIS — R079 Chest pain, unspecified: Secondary | ICD-10-CM

## 2019-01-30 DIAGNOSIS — Z130 Encounter for screening for diseases of the blood and blood-forming organs and certain disorders involving the immune mechanism: Secondary | ICD-10-CM | POA: Diagnosis not present

## 2019-01-30 DIAGNOSIS — Z1329 Encounter for screening for other suspected endocrine disorder: Secondary | ICD-10-CM

## 2019-01-30 DIAGNOSIS — Z13220 Encounter for screening for lipoid disorders: Secondary | ICD-10-CM | POA: Diagnosis not present

## 2019-01-30 DIAGNOSIS — I10 Essential (primary) hypertension: Secondary | ICD-10-CM | POA: Diagnosis not present

## 2019-01-30 DIAGNOSIS — N183 Chronic kidney disease, stage 3 unspecified: Secondary | ICD-10-CM

## 2019-01-30 DIAGNOSIS — Z13228 Encounter for screening for other metabolic disorders: Secondary | ICD-10-CM

## 2019-01-30 LAB — LIPID PANEL
Cholesterol: 174 mg/dL (ref 0–200)
HDL: 50.8 mg/dL (ref >39.00)
LDL Cholesterol: 109 mg/dL — ABNORMAL HIGH (ref 0–99)
NonHDL: 123.01
TRIGLYCERIDES: 69 mg/dL (ref 0.0–149.0)
Total CHOL/HDL Ratio: 3
VLDL: 13.8 mg/dL (ref 0.0–40.0)

## 2019-01-30 LAB — VITAMIN D 25 HYDROXY (VIT D DEFICIENCY, FRACTURES): VITD: 25.18 ng/mL — ABNORMAL LOW (ref 30.00–100.00)

## 2019-01-30 LAB — BASIC METABOLIC PANEL
BUN: 21 mg/dL (ref 6–23)
CALCIUM: 9.4 mg/dL (ref 8.4–10.5)
CO2: 28 meq/L (ref 19–32)
Chloride: 105 mEq/L (ref 96–112)
Creatinine, Ser: 1.21 mg/dL — ABNORMAL HIGH (ref 0.40–1.20)
GFR: 54.61 mL/min — ABNORMAL LOW (ref >60.00)
Glucose, Bld: 78 mg/dL (ref 70–99)
POTASSIUM: 4.9 meq/L (ref 3.5–5.1)
Sodium: 140 mEq/L (ref 135–145)

## 2019-01-30 MED ORDER — AMLODIPINE BESYLATE 5 MG PO TABS: ORAL_TABLET | ORAL | 3 refills | Status: DC

## 2019-01-30 MED ORDER — DICLOFENAC SODIUM 1 % TD GEL: 4.0000 g | Freq: Four times a day (QID) | TRANSDERMAL | 3 refills | Status: DC

## 2019-01-30 MED ORDER — METOPROLOL SUCCINATE ER 25 MG PO TB24: 25.0000 mg | ORAL_TABLET | Freq: Every day | ORAL | 3 refills | Status: DC

## 2019-01-30 NOTE — Assessment & Plan Note (Signed)
Adequate hydration and low salt diet. Continue avoiding NSAIDs. Adequate BP control.

## 2019-01-30 NOTE — Assessment & Plan Note (Signed)
Adequately controlled. Continue metoprolol succinate 25 mg daily and amlodipine 5 mg daily. Recommend monitoring BP periodically. Continue low-salt diet. We will continue following annually, before if needed.

## 2019-01-30 NOTE — Progress Notes (Signed)
HPI:   Cynthia Berger is a 63 y.o. female, who is here today for her routine physical.  Last CPE: 10/24/2017.  Regular exercise 3 or more time per week: She is active at work, walks daily. Following a healthful diet: No She lives with her husband.  Chronic medical problems: Hypertension, multinodular goiter (follows with Dr. Loanne Drilling), vitamin D deficiency, status post right nephrectomy (renal cancer), and OSA among some. She follows with urologist every 3 months.  Pap smear:11/2018, she sees Dr Eliane Decree. S/P hysterectomy.  Immunization History  Administered Date(s) Administered  . Influenza,inj,Quad PF,6+ Mos 10/24/2017, 01/03/2019  . Influenza-Unspecified 09/10/2013  . Pneumococcal Polysaccharide-23 10/24/2017  . Tdap 06/09/2014    Mammogram: 10/2017, she has an appointment tomorrow. Colonoscopy: 12/25/2017. DEXA: 01/2014, normal. Vitamin D deficiency, she takes multivitamins with calcium and vitamin D. Last 25 OH vitamin D done in 10/2017, normal at 32.8.   Hep C screening: 10/2017, NR  Hypertension: Currently she is on amlodipine 5 mg daily and metoprolol succinate 25 mg daily. She does not check BP regularly.  Lab Results  Component Value Date   CREATININE 1.13 10/24/2017   BUN 10 10/24/2017   NA 139 10/24/2017   K 4.4 10/24/2017   CL 105 10/24/2017   CO2 27 10/24/2017    Today she is concerned about intermittent "inflammation in chest." She was seen on 01/03/2019 due to left sided chest pain. Pain happened at rest, it is not exacerbated by exertion. Pain may last a few hours. Pain has improved, it is intermittent, 5/10 soreness sensation. Pain is exacerbated by movement and alleviated by rest and local heat. She denies associated fever, chills, cough, dyspnea, wheezing, or palpitations.  She has not had any pain for the past 2 days.   Review of Systems  Constitutional: Negative for appetite change, fatigue and fever.  HENT: Negative for dental  problem, hearing loss, mouth sores, sore throat, trouble swallowing and voice change.   Eyes: Negative for redness and visual disturbance.  Respiratory: Negative for cough, shortness of breath and wheezing.   Cardiovascular: Negative for cardiac like chest pain and leg swelling.  Left-sided chest wall pain. Gastrointestinal: Negative for abdominal pain, nausea and vomiting.       No changes in bowel habits.  Endocrine: Negative for cold intolerance, heat intolerance, polydipsia, polyphagia and polyuria.  Genitourinary: Negative for decreased urine volume, dysuria, hematuria, vaginal bleeding and vaginal discharge.  Musculoskeletal: Negative for arthralgias, gait problem and myalgias.  Skin: Negative for color change and rash.  Allergic/Immunologic: Negative for environmental allergies.  Neurological: Negative for syncope, weakness and headaches.  Hematological: Negative for adenopathy. Does not bruise/bleed easily.  Psychiatric/Behavioral: Negative for confusion and sleep disturbance. The patient is not nervous/anxious.   All other systems reviewed and are negative.     Current Outpatient Medications on File Prior to Visit  Medication Sig Dispense Refill  . cholecalciferol (VITAMIN D) 1000 UNITS tablet Take 1,000 Units by mouth daily.    . Multiple Vitamins-Minerals (MULTIVITAMIN PO) Take 1 tablet by mouth daily.     Marland Kitchen OVER THE COUNTER MEDICATION Probiotic capsule. One capsule daily.    . [DISCONTINUED] dicyclomine (BENTYL) 20 MG tablet Take 1 tablet (20 mg total) by mouth 2 (two) times daily. 40 tablet 0   No current facility-administered medications on file prior to visit.      Past Medical History:  Diagnosis Date  . AC (acromioclavicular) joint bone spurs    HEEL BONE SPURS-SURGERY 12/2010  LEFT FOOT  . Antral gastritis   . Cancer ([place])   . 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 DDD (degenerative disc disease), cervical   . Esophageal dysmotility    . GERD (gastroesophageal reflux disease)   . Heart palpitations   . Hiatal hernia   . Hypertension   . LGSIL (low grade squamous intraepithelial dysplasia) 2013    C&B Inadequate ECC--positive LGSIL --  . Multinodular goiter    RIGHT LOBE-- PER BX NEGATIVE  . Positive H. pylori test   . Vitamin D deficiency 11/2007   LOW AT 18    Past Surgical History:  Procedure Laterality Date  . ABDOMINAL HYSTERECTOMY N/A 05/28/2013   Procedure: HYSTERECTOMY ABDOMINAL;  Surgeon: Anastasio Auerbach, MD;  Location: [facility];  Service: Gynecology;  Laterality: N/A;  . BIOPSY THYROID Right 2014   ENLARGED THYROID NO MEDS BENIGN  . CERVICAL BIOPSY  W/ LOOP ELECTRODE EXCISION  10/2012   for persistent low-grade dysplasia  . CESAREAN SECTION  1993   W/ TUBAL LIGATION, BILATERAL  . CYSTOSCOPY N/A 05/28/2013   Procedure: CYSTOSCOPY;  Surgeon: Anastasio Auerbach, MD;  Location: [facility];  Service: Gynecology;  Laterality: N/A;  . esophogeal dilatation    . FOOT SURGERY  2012   left heel spur removed  . HYSTEROSCOPY W/D&C  06/08/2012   Procedure: DILATATION AND CURETTAGE /HYSTEROSCOPY;  Surgeon: Anastasio Auerbach, MD;  Location: [facility];  Service: Gynecology;  Laterality: N/A;  WITH RESECTION OF MYOMA  . NEPHRECTOMY  2005   RIGHT KIDNEY CANCER  . ORIF TOE FRACTURE Right 08/15/2018   Procedure: RIGHT FIFTH TOE OSTEOTOMY AND PINNING;  Surgeon: Marybelle Killings, MD;  Location: [facility];  Service: Orthopedics;  Laterality: Right;  . ROTATOR CUFF REPAIR  2010   RIGHT  . s/p Right nephrectomy Right   . SALPINGOOPHORECTOMY Bilateral 05/28/2013   Procedure: BILATERAL SALPINGO OOPHORECTOMY;  Surgeon: Anastasio Auerbach, MD;  Location: [facility];  Service: Gynecology;  Laterality: Bilateral;  . TRANSTHORACIC ECHOCARDIOGRAM  04-10-2012  DR BRIEN CRENSHAW   LVSF MILDLY REDUCED/ EF 45-50%  . TUBAL LIGATION      Allergies  Allergen Reactions  . Penicillins Rash    Family History    Problem Relation Age of Onset  . Hypertension Mother   . Hypertension Sister   . Hypertension Son   . Colon cancer Neg Hx   . Esophageal cancer Neg Hx   . Rectal cancer Neg Hx   . Stomach cancer Neg Hx   . Breast cancer Neg Hx     Social History   Socioeconomic History  . Marital status: Married    Spouse name: Not on file  . Number of children: 4  . Years of education: Not on file  . Highest education level: Not on file  Occupational History  . Occupation: [employer]    Employer: [employer]  . Financial resource strain: Not on file  . Food insecurity:    Worry: Not on file    Inability: Not on file  . Transportation needs:    Medical: Not on file    Non-medical: Not on file  Tobacco Use  . Smoking status: Never Smoker  . Smokeless tobacco: Never Used  Substance and Sexual Activity  . Alcohol use: Yes    Alcohol/week: 0.0 standard drinks    Comment: occasional wine  . Drug  use: No  . Sexual activity: Yes    Partners: Male    Birth control/protection: Post-menopausal    Comment: INTERCOURSE AGE 88, SEXUAL PARTNERS LESS THAN 5  Lifestyle  . Physical activity:    Days per week: Not on file    Minutes per session: Not on file  . Stress: Not on file  Relationships  . Social connections:    Talks on phone: Not on file    Gets together: Not on file    Attends religious service: Not on file    Active member of club or organization: Not on file    Attends meetings of clubs or organizations: Not on file    Relationship status: Not on file  Other Topics Concern  . Not on file  Social History Narrative  . Not on file     Vitals:   01/30/19 0932  BP: 126/72  Pulse: 71  Resp: 12  Temp: 98.5 F (36.9 C)  SpO2: 100%   Body mass index is 32.86 kg/m.   Wt Readings from Last 3 Encounters:  01/30/19 185 lb 8 oz (84.1 kg)  01/09/19 183 lb 9.6 oz (83.3 kg)  01/03/19 183 lb (83 kg)     Physical Exam  Nursing note and vitals  reviewed. Constitutional: She is oriented to person, place, and time. She appears well-developed. No distress.  HENT:  Head: Normocephalic and atraumatic.  Right Ear: Hearing, tympanic membrane, external ear and ear canal normal.  Left Ear: Hearing, tympanic membrane, external ear and ear canal normal.  Mouth/Throat: Uvula is midline, oropharynx is clear and moist and mucous membranes are normal.  Eyes: Pupils are equal, round, and reactive to light. Conjunctivae and EOM are normal.  Neck: No tracheal deviation present. No thyromegaly present.  Cardiovascular: Normal rate and regular rhythm.  No murmur heard. Pulses:      Dorsalis pedis pulses are 2+ on the right side, and 2+ on the left side.  Respiratory: Effort normal and breath sounds normal. No respiratory distress.  GI: Soft. She exhibits no mass. There is no hepatomegaly. There is no tenderness.  Genitourinary:Comments: Deferred to gyn.  Musculoskeletal: She exhibits no edema.  No tenderness upon palpation of chest wall or chondrocostal joints.  There is not tenderness upon palpation up cervical paraspinal muscles or trapezium bilateral. Left shoulder abduction elicits left-sided chest pain. Shoulder range of motion otherwise normal, bilateral. No major deformity or signs of synovitis appreciated.  Lymphadenopathy:    She has no cervical adenopathy.       Right: No supraclavicular adenopathy present.       Left: No supraclavicular adenopathy present.  Neurological: She is alert and oriented to person, place, and time. She has normal strength. No cranial nerve deficit. Coordination and gait normal.  Reflex Scores:      Bicep reflexes are 2+ on the right side and 2+ on the left side.      Patellar reflexes are 2+ on the right side and 2+ on the left side. Skin: Skin is warm. No rash noted. No erythema.  Psychiatric: She has a normal mood and affect. Cognitive function grossly intact. Well groomed, good eye contact.      ASSESSMENT AND PLAN:  Cynthia Berger was here today annual physical examination.  Orders Placed This Encounter  Procedures  . DG Chest 2 View  . Basic metabolic panel  . Lipid panel  . VITAMIN D 25 Hydroxy (Vit-D Deficiency, Fractures)    Lab Results  Component  Value Date   CREATININE 1.21 (H) 01/30/2019   BUN 21 01/30/2019   NA 140 01/30/2019   K 4.9 01/30/2019   CL 105 01/30/2019   CO2 28 01/30/2019   Lab Results  Component Value Date   CHOL 174 01/30/2019   HDL 50.80 01/30/2019   LDLCALC 109 (H) 01/30/2019   TRIG 69.0 01/30/2019   CHOLHDL 3 01/30/2019     Routine general medical examination at a health care facility We discussed the importance of regular physical activity and healthy diet for prevention of chronic illness and/or complications. Preventive guidelines reviewed. Vaccination up-to-date. She will continue following with gynecologist for her female preventive care.  Ca++ and vit D supplementation recommended. Next CPE in a year.  The 10-year ASCVD risk score Mikey Bussing DC Brooke Bonito., et al., 2013) is: 6.4%   Values used to calculate the score:     Age: 46 years     Sex: Female     Is Non-Hispanic African American: Yes     Diabetic: No     Tobacco smoker: No     Systolic Blood Pressure: 656 mmHg     Is BP treated: Yes     HDL Cholesterol: 50.8 mg/dL     Total Cholesterol: 174 mg/dL  Chest pain, unspecified type We discussed possible etiologies. History and clinical findings suggest musculoskeletal chest pain, costochondritis. Also reviewing records, she had a CXR in 09/2014 due to left-sided chest wall pain. Because it has been recurrent,CXR was ordered today.  -     DG Chest 2 View; Future -     diclofenac sodium (VOLTAREN) 1 % GEL; Apply 4 g topically 4 (four) times daily.   Screening for lipoid disorders -     Lipid panel  Screening for endocrine, metabolic and immunity disorder -     Basic metabolic panel  Vitamin D deficiency For now  she will continue multivitamins with vitamin D (400 units). Further recommendation will be given according to 25 OH vitamin D results.  Hypertension Adequately controlled. Continue metoprolol succinate 25 mg daily and amlodipine 5 mg daily. Recommend monitoring BP periodically. Continue low-salt diet. We will continue following annually, before if needed.  CKD (chronic kidney disease), stage III (HCC) Adequate hydration and low salt diet. Continue avoiding NSAIDs. Adequate BP control.   Vitamin D deficiency For now she will continue multivitamins with vitamin D (400 units). Further recommendation will be given according to 25 OH vitamin D results.  Hypertension Adequately controlled. Continue metoprolol succinate 25 mg daily and amlodipine 5 mg daily. Recommend monitoring BP periodically. Continue low-salt diet. We will continue following annually, before if needed.  CKD (chronic kidney disease), stage III (HCC) Adequate hydration and low salt diet. Continue avoiding NSAIDs. Adequate BP control.    Return in 1 year (on 01/31/2020) for CPE and f/u.       G. Martinique, MD  [facility]. [place] office.

## 2019-01-30 NOTE — Patient Instructions (Addendum)
A few things to remember from today's visit:   Routine general medical examination at a health care facility  Essential hypertension  Chest pain, unspecified type - Plan: DG Chest 2 View  Vitamin D deficiency - Plan: VITAMIN D 25 Hydroxy (Vit-D Deficiency, Fractures)  CKD (chronic kidney disease), stage III ([place]) - Plan: Basic metabolic panel, VITAMIN D 25 Hydroxy (Vit-D Deficiency, Fractures)  Screening for lipoid disorders - Plan: Lipid panel  Screening for endocrine, metabolic and immunity disorder - Plan: Basic metabolic panel  Topical Voltaren may help with chest pain. No changes or new blood pressure medications.  Today you have you routine preventive visit.  At least 150 minutes of moderate exercise per week, daily brisk walking for 15-30 min is a good exercise option. Healthy diet low in saturated (animal) fats and sweets and consisting of fresh fruits and vegetables, lean meats such as fish and white chicken and whole grains.  These are some of recommendations for screening depending of age and risk factors:   - Vaccines:  Tdap vaccine every 10 years.  Shingles vaccine recommended at age 55, could be given after 62 years of age but not sure about insurance coverage.   Pneumonia vaccines:  Prevnar 13 at 65 and Pneumovax at 71. Sometimes Pneumovax is giving earlier if history of smoking, lung disease,diabetes,kidney disease among some.    Screening for diabetes at age 30 and every 3 years.  Cervical cancer prevention:  Pap smear starts at 62 years of age and continues periodically until 62 years old in low risk women. Pap smear every 3 years between 40 and 74 years old. Pap smear every 3-5 years between women 2 and older if pap smear negative and HPV screening negative.   -Breast cancer: Mammogram: There is disagreement between experts about when to start screening in low risk asymptomatic female but recent recommendations are to start screening at 58 and not  later than 62 years old , every 1-2 years and after 62 yo q 2 years. Screening is recommended until 62 years old but some women can continue screening depending of healthy issues.   Colon cancer screening: starts at 61 years old until 62 years old.  Cholesterol disorder screening at age 59 and every 3 years.  Also recommended:  1. Dental visit- Brush and floss your teeth twice daily; visit your dentist twice a year. 2. Eye doctor- Get an eye exam at least every 2 years. 3. Helmet use- Always wear a helmet when riding a bicycle, motorcycle, rollerblading or skateboarding. 4. Safe sex- If you may be exposed to sexually transmitted infections, use a condom. 5. Seat belts- Seat belts can save your live; always wear one. 6. Smoke/Carbon Monoxide detectors- These detectors need to be installed on the appropriate level of your home. Replace batteries at least once a year. 7. Skin cancer- When out in the sun please cover up and use sunscreen 15 SPF or higher. 8. Violence- If anyone is threatening or hurting you, please tell your healthcare provider.  9. Drink alcohol in moderation- Limit alcohol intake to one drink or less per day. Never drink and drive.  Please be sure medication list is accurate. If a new problem present, please set up appointment sooner than planned today.

## 2019-01-30 NOTE — Assessment & Plan Note (Signed)
For now she will continue multivitamins with vitamin D (400 units). Further recommendation will be given according to 25 OH vitamin D results.

## 2019-01-31 ENCOUNTER — Ambulatory Visit
Admission: RE | Admit: 2019-01-31 | Discharge: 2019-01-31 | Disposition: A | Discharge status: Home / Self Care | Payer: 59 | Source: Ambulatory Visit | Attending: Women's Health | Admitting: Women's Health

## 2019-01-31 DIAGNOSIS — Z1231 Encounter for screening mammogram for malignant neoplasm of breast: Secondary | ICD-10-CM

## 2019-02-01 ENCOUNTER — Encounter: Payer: Self-pay | Admitting: Family Medicine

## 2019-02-02 ENCOUNTER — Encounter: Payer: Self-pay | Admitting: Family Medicine

## 2019-07-24 ENCOUNTER — Telehealth: Payer: Self-pay | Admitting: Family Medicine

## 2019-07-24 NOTE — Telephone Encounter (Signed)
Left detailed message informing patient that she can go get tested at one of the community testing sites. Advised to call office with any questions.

## 2019-07-24 NOTE — Telephone Encounter (Signed)
Pt says that she was exposed to her grandson that has tested positive for covid.   Pt says that she has a cough and a stuffy nose. Pt says that she feels fine but would like to be advised by her PCP, if she should be tested?   Please advise.  CB:[phone]

## 2019-07-24 NOTE — Telephone Encounter (Signed)
See note

## 2019-12-19 ENCOUNTER — Other Ambulatory Visit: Payer: Self-pay

## 2019-12-23 ENCOUNTER — Encounter: Payer: Self-pay | Admitting: Women's Health

## 2019-12-23 ENCOUNTER — Other Ambulatory Visit: Payer: Self-pay

## 2019-12-23 ENCOUNTER — Ambulatory Visit (INDEPENDENT_AMBULATORY_CARE_PROVIDER_SITE_OTHER): Payer: BC Managed Care – PPO | Admitting: Women's Health

## 2019-12-23 VITALS — BP 126/82 | Ht 62.5 in | Wt 186.4 lb

## 2019-12-23 DIAGNOSIS — Z01419 Encounter for gynecological examination (general) (routine) without abnormal findings: Secondary | ICD-10-CM | POA: Diagnosis not present

## 2019-12-23 DIAGNOSIS — Z13820 Encounter for screening for osteoporosis: Secondary | ICD-10-CM

## 2019-12-23 NOTE — Addendum Note (Signed)
Addended by: Gae Gallop T on: 12/23/2019 03:39 PM   Modules accepted: Orders

## 2019-12-23 NOTE — Patient Instructions (Signed)
It was good to see you today! Vit D 2000 iu daily Health Maintenance After Age 63 After age 9, you are at a higher risk for certain long-term diseases and infections as well as injuries from falls. Falls are a major cause of broken bones and head injuries in people who are older than age 67. Getting regular preventive care can help to keep you healthy and well. Preventive care includes getting regular testing and making lifestyle changes as recommended by your health care provider. Talk with your health care provider about:  Which screenings and tests you should have. A screening is a test that checks for a disease when you have no symptoms.  A diet and exercise plan that is right for you. What should I know about screenings and tests to prevent falls? Screening and testing are the best ways to find a health problem early. Early diagnosis and treatment give you the best chance of managing medical conditions that are common after age 66. Certain conditions and lifestyle choices may make you more likely to have a fall. Your health care provider may recommend:  Regular vision checks. Poor vision and conditions such as cataracts can make you more likely to have a fall. If you wear glasses, make sure to get your prescription updated if your vision changes.  Medicine review. Work with your health care provider to regularly review all of the medicines you are taking, including over-the-counter medicines. Ask your health care provider about any side effects that may make you more likely to have a fall. Tell your health care provider if any medicines that you take make you feel dizzy or sleepy.  Osteoporosis screening. Osteoporosis is a condition that causes the bones to get weaker. This can make the bones weak and cause them to break more easily.  Blood pressure screening. Blood pressure changes and medicines to control blood pressure can make you feel dizzy.  Strength and balance checks. Your health  care provider may recommend certain tests to check your strength and balance while standing, walking, or changing positions.  Foot health exam. Foot pain and numbness, as well as not wearing proper footwear, can make you more likely to have a fall.  Depression screening. You may be more likely to have a fall if you have a fear of falling, feel emotionally low, or feel unable to do activities that you used to do.  Alcohol use screening. Using too much alcohol can affect your balance and may make you more likely to have a fall. What actions can I take to lower my risk of falls? General instructions  Talk with your health care provider about your risks for falling. Tell your health care provider if: ? You fall. Be sure to tell your health care provider about all falls, even ones that seem minor. ? You feel dizzy, sleepy, or off-balance.  Take over-the-counter and prescription medicines only as told by your health care provider. These include any supplements.  Eat a healthy diet and maintain a healthy weight. A healthy diet includes low-fat dairy products, low-fat (lean) meats, and fiber from whole grains, beans, and lots of fruits and vegetables. Home safety  Remove any tripping hazards, such as rugs, cords, and clutter.  Install safety equipment such as grab bars in bathrooms and safety rails on stairs.  Keep rooms and walkways well-lit. Activity   Follow a regular exercise program to stay fit. This will help you maintain your balance. Ask your health care provider what types of  exercise are appropriate for you.  If you need a cane or walker, use it as recommended by your health care provider.  Wear supportive shoes that have nonskid soles. Lifestyle  Do not drink alcohol if your health care provider tells you not to drink.  If you drink alcohol, limit how much you have: ? 0-1 drink a day for women. ? 0-2 drinks a day for men.  Be aware of how much alcohol is in your drink. In  the [place] one drink equals one typical bottle of beer (12 oz), one-half glass of wine (5 oz), or one shot of hard liquor (1 oz).  Do not use any products that contain nicotine or tobacco, such as cigarettes and e-cigarettes. If you need help quitting, ask your health care provider. Summary  Having a healthy lifestyle and getting preventive care can help to protect your health and wellness after age 17.  Screening and testing are the best way to find a health problem early and help you avoid having a fall. Early diagnosis and treatment give you the best chance for managing medical conditions that are more common for people who are older than age 85.  Falls are a major cause of broken bones and head injuries in people who are older than age 34. Take precautions to prevent a fall at home.  Work with your health care provider to learn what changes you can make to improve your health and wellness and to prevent falls. This information is not intended to replace advice given to you by your health care provider. Make sure you discuss any questions you have with your health care provider. Document Revised: 03/28/2019 Document Reviewed: 10/18/2017 Elsevier Patient Education  2020 [employer].

## 2019-12-23 NOTE — Progress Notes (Signed)
Cynthia Berger 07-16-1956 [id number]    History:    Presents for annual exam.  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  Normal mammogram history.  Has had Shingrix.  2015 normal DEXA.  12/2017 - colonoscopy.  2005 right kidney cancer/nephrectomy symptoms.  Hypertension primary care manages.  GERD/reflux.  Past medical history, past surgical history, family history and social history were all reviewed and documented in the EPIC chart.  Retired, 4 children and many grandchildren all doing well.  ROS:  A ROS was performed and pertinent positives and negatives are included.  Exam:  Vitals:   12/23/19 1356  BP: 126/82  Weight: 186 lb 6.4 oz (84.6 kg)  Height: 5' 2.5" (1.588 m)   Body mass index is 33.55 kg/m.   General appearance:  Normal Thyroid:  Symmetrical, normal in size, without palpable masses or nodularity. Respiratory  Auscultation:  Clear without wheezing or rhonchi Cardiovascular  Auscultation:  Regular rate, without rubs, murmurs or gallops  Edema/varicosities:  Not grossly evident Abdominal  Soft,nontender, without masses, guarding or rebound.  Liver/spleen:  No organomegaly noted  Hernia:  None appreciated  Skin  Inspection:  Grossly normal   Breasts: Examined lying and sitting.     Right: Without masses, retractions, discharge or axillary adenopathy.     Left: Without masses, retractions, discharge or axillary adenopathy. Gentitourinary   Inguinal/mons:  Normal without inguinal adenopathy  External genitalia:  Normal  BUS/Urethra/Skene's glands:  Normal  Vagina:  Normal  Cervix: And uterus absent and GERD  Adnexa/parametria:     Rt: Without masses or tenderness.   Lt: Without masses or tenderness.  Anus and perineum: Normal  Digital rectal exam: Normal sphincter tone without palpated masses or tenderness  Assessment/Plan:  64 y.o.  MBF G6P4  for annual exam with no complaints of vaginal discharge, urinary symptoms, abdominal pain or  fever..  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  Reviewed importance of increasing regular exercise, getting back to exercise videos.  Decrease calorie/carbs.  Schedule DEXA.  Pap.    Huel Cote [facility], 2:21 PM 12/23/2019

## 2019-12-24 LAB — PAP IG W/ RFLX HPV ASCU

## 2019-12-31 ENCOUNTER — Other Ambulatory Visit: Payer: Self-pay | Admitting: Women's Health

## 2019-12-31 DIAGNOSIS — Z1231 Encounter for screening mammogram for malignant neoplasm of breast: Secondary | ICD-10-CM

## 2020-01-07 ENCOUNTER — Other Ambulatory Visit: Payer: Self-pay | Admitting: Family Medicine

## 2020-01-07 DIAGNOSIS — I10 Essential (primary) hypertension: Secondary | ICD-10-CM

## 2020-01-08 ENCOUNTER — Other Ambulatory Visit: Payer: Self-pay | Admitting: Family Medicine

## 2020-01-08 ENCOUNTER — Other Ambulatory Visit: Payer: Self-pay

## 2020-01-08 DIAGNOSIS — I10 Essential (primary) hypertension: Secondary | ICD-10-CM

## 2020-01-09 ENCOUNTER — Ambulatory Visit (INDEPENDENT_AMBULATORY_CARE_PROVIDER_SITE_OTHER): Payer: BC Managed Care – PPO

## 2020-01-09 ENCOUNTER — Other Ambulatory Visit: Payer: Self-pay | Admitting: Women's Health

## 2020-01-09 ENCOUNTER — Other Ambulatory Visit: Payer: Self-pay | Admitting: *Deleted

## 2020-01-09 ENCOUNTER — Other Ambulatory Visit: Payer: Self-pay | Admitting: Obstetrics & Gynecology

## 2020-01-09 DIAGNOSIS — Z780 Asymptomatic menopausal state: Secondary | ICD-10-CM

## 2020-01-09 DIAGNOSIS — Z13820 Encounter for screening for osteoporosis: Secondary | ICD-10-CM

## 2020-01-10 ENCOUNTER — Ambulatory Visit (INDEPENDENT_AMBULATORY_CARE_PROVIDER_SITE_OTHER): Payer: BC Managed Care – PPO | Admitting: Endocrinology

## 2020-01-10 ENCOUNTER — Encounter: Payer: Self-pay | Admitting: Endocrinology

## 2020-01-10 ENCOUNTER — Other Ambulatory Visit: Payer: Self-pay

## 2020-01-10 VITALS — BP 118/78 | HR 79 | Ht 62.5 in | Wt 189.0 lb

## 2020-01-10 DIAGNOSIS — E042 Nontoxic multinodular goiter: Secondary | ICD-10-CM | POA: Diagnosis not present

## 2020-01-10 LAB — TSH: TSH: 0.99 u[IU]/mL (ref 0.35–4.50)

## 2020-01-10 NOTE — Patient Instructions (Signed)
Blood tests are requested for you today.  We'll let you know about the results.  Let's recheck the ultrasound.  you will receive a phone call, about a day and time for an appointment.   most of the time, a "lumpy thyroid" will eventually become overactive.  this is usually a slow process, happening over the span of many years.   Please come back for a follow-up appointment in 1 year.  

## 2020-01-10 NOTE — Progress Notes (Addendum)
Subjective:    Patient ID: Cynthia Berger, female    DOB: 12/19/1956, 64 y.o.   MRN: [id number]  HPI Pt returns for f/u of small multinodular goiter (dx'ed 2013; bx of [place] nodule #2) was low-risk; she has been euthyroid off rx; f/u US in 2020 was unchanged, and did not meet criteria for bx; TSH is low-normal).  She does not notice any change in the size of the goiter.  pt states she feels well in general.   Past Medical History:  Diagnosis Date  . AC (acromioclavicular) joint bone spurs    HEEL BONE SPURS-SURGERY 12/2010 LEFT FOOT  . Antral gastritis   . Cancer ([place])   . 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 DDD (degenerative disc disease), cervical   . Esophageal dysmotility   . GERD (gastroesophageal reflux disease)   . Heart palpitations   . Hiatal hernia   . Hypertension   . LGSIL (low grade squamous intraepithelial dysplasia) 2013    C&B Inadequate ECC--positive LGSIL --  . Multinodular goiter    RIGHT LOBE-- PER BX NEGATIVE  . Positive H. pylori test   . Vitamin D deficiency 11/2007   LOW AT 18    Past Surgical History:  Procedure Laterality Date  . ABDOMINAL HYSTERECTOMY N/A 05/28/2013   Procedure: HYSTERECTOMY ABDOMINAL;  Surgeon: Anastasio Auerbach, MD;  Location: [facility];  Service: Gynecology;  Laterality: N/A;  . BIOPSY THYROID Right 2014   ENLARGED THYROID NO MEDS BENIGN  . CERVICAL BIOPSY  W/ LOOP ELECTRODE EXCISION  10/2012   for persistent low-grade dysplasia  . CESAREAN SECTION  1993   W/ TUBAL LIGATION, BILATERAL  . CYSTOSCOPY N/A 05/28/2013   Procedure: CYSTOSCOPY;  Surgeon: Anastasio Auerbach, MD;  Location: [facility];  Service: Gynecology;  Laterality: N/A;  . esophogeal dilatation    . FOOT SURGERY  2012   left heel spur removed  . HYSTEROSCOPY WITH D & C  06/08/2012   Procedure: DILATATION AND CURETTAGE /HYSTEROSCOPY;  Surgeon: Anastasio Auerbach, MD;  Location: [facility];  Service: Gynecology;  Laterality: N/A;   WITH RESECTION OF MYOMA  . NEPHRECTOMY  2005   RIGHT KIDNEY CANCER  . ORIF TOE FRACTURE Right 08/15/2018   Procedure: RIGHT FIFTH TOE OSTEOTOMY AND PINNING;  Surgeon: Marybelle Killings, MD;  Location: [facility];  Service: Orthopedics;  Laterality: Right;  . ROTATOR CUFF REPAIR  2010   RIGHT  . s/p Right nephrectomy Right   . SALPINGOOPHORECTOMY Bilateral 05/28/2013   Procedure: BILATERAL SALPINGO OOPHORECTOMY;  Surgeon: Anastasio Auerbach, MD;  Location: [facility];  Service: Gynecology;  Laterality: Bilateral;  . TOE SURGERY  2020   baby toe on right foot  . TRANSTHORACIC ECHOCARDIOGRAM  04-10-2012  DR BRIEN CRENSHAW   LVSF MILDLY REDUCED/ EF 45-50%  . TUBAL LIGATION      Social History   Socioeconomic History  . Marital status: Married    Spouse name: Not on file  . Number of children: 4  . Years of education: Not on file  . Highest education level: Not on file  Occupational History  . Occupation: [employer]    Employer: [employer]  Tobacco Use  . Smoking status: Never Smoker  . Smokeless tobacco: Never Used  Substance and Sexual Activity  . Alcohol use: Not Currently    Alcohol/week: 0.0 standard drinks    Comment: occasional wine  .  Drug use: No  . Sexual activity: Yes    Partners: Male    Birth control/protection: Post-menopausal    Comment: INTERCOURSE AGE 85, SEXUAL PARTNERS LESS THAN 5  Other Topics Concern  . Not on file  Social History Narrative  . Not on file   Social Determinants of Health   Financial Resource Strain:   . Difficulty of Paying Living Expenses: Not on file  Food Insecurity:   . Worried About Charity fundraiser in the Last Year: Not on file  . Ran Out of Food in the Last Year: Not on file  Transportation Needs:   . Lack of Transportation (Medical): Not on file  . Lack of Transportation (Non-Medical): Not on file  Physical Activity:   . Days of Exercise per Week: Not on file  . Minutes of Exercise per Session: Not on  file  Stress:   . Feeling of Stress : Not on file  Social Connections:   . Frequency of Communication with Friends and Family: Not on file  . Frequency of Social Gatherings with Friends and Family: Not on file  . Attends Religious Services: Not on file  . Active Member of Clubs or Organizations: Not on file  . Attends Archivist Meetings: Not on file  . Marital Status: Not on file  Intimate Partner Violence:   . Fear of Current or Ex-Partner: Not on file  . Emotionally Abused: Not on file  . Physically Abused: Not on file  . Sexually Abused: Not on file    Current Outpatient Medications on File Prior to Visit  Medication Sig Dispense Refill  . amLODipine (NORVASC) 5 MG tablet TAKE 1 TABLET BY MOUTH EVERY DAY 90 tablet 0  . cholecalciferol (VITAMIN D) 1000 UNITS tablet Take 1,000 Units by mouth daily.    . diclofenac sodium (VOLTAREN) 1 % GEL Apply 4 g topically 4 (four) times daily. 4 Tube 3  . esomeprazole (NEXIUM) 20 MG packet Take 20 mg by mouth daily before breakfast.    . metoprolol succinate (TOPROL-XL) 25 MG 24 hr tablet TAKE 1 TABLET BY MOUTH EVERY DAY 90 tablet 0  . Multiple Vitamins-Minerals (MULTIVITAMIN PO) Take 1 tablet by mouth daily.     Marland Kitchen OVER THE COUNTER MEDICATION Probiotic capsule. One capsule daily.    . [DISCONTINUED] dicyclomine (BENTYL) 20 MG tablet Take 1 tablet (20 mg total) by mouth 2 (two) times daily. 40 tablet 0   No current facility-administered medications on file prior to visit.    Allergies  Allergen Reactions  . Penicillins Rash    Family History  Problem Relation Age of Onset  . Hypertension Mother   . Hypertension Sister   . Hypertension Son   . Colon cancer Neg Hx   . Esophageal cancer Neg Hx   . Rectal cancer Neg Hx   . Stomach cancer Neg Hx   . Breast cancer Neg Hx     BP 118/78 (BP Location: Left Arm, Patient Position: Sitting, Cuff Size: Large)   Pulse 79   Ht 5' 2.5" (1.588 m)   Wt 189 lb (85.7 kg)   LMP  08/23/2006   SpO2 98%   BMI 34.02 kg/m    Review of Systems Denies neck pain and sob.     Objective:   Physical Exam VITAL SIGNS:  See vs page GENERAL: no distress NECK: MNG (R>L), is again noted.        Assessment & Plan:  MNG: due for recheck  Patient Instructions  Blood tests are requested for you today.  We'll let you know about the results.  Let's recheck the ultrasound.  you will receive a phone call, about a day and time for an appointment. most of the time, a "lumpy thyroid" will eventually become overactive.  this is usually a slow process, happening over the span of many years.   Please come back for a follow-up appointment in 1 year.

## 2020-01-29 ENCOUNTER — Ambulatory Visit: Payer: BC Managed Care – PPO | Admitting: Family Medicine

## 2020-01-31 ENCOUNTER — Ambulatory Visit
Admission: RE | Admit: 2020-01-31 | Discharge: 2020-01-31 | Disposition: A | Discharge status: Home / Self Care | Payer: BC Managed Care – PPO | Source: Ambulatory Visit | Attending: Endocrinology | Admitting: Endocrinology

## 2020-01-31 DIAGNOSIS — E041 Nontoxic single thyroid nodule: Secondary | ICD-10-CM | POA: Diagnosis not present

## 2020-01-31 DIAGNOSIS — E042 Nontoxic multinodular goiter: Secondary | ICD-10-CM

## 2020-02-03 ENCOUNTER — Other Ambulatory Visit: Payer: Self-pay

## 2020-02-03 ENCOUNTER — Ambulatory Visit (INDEPENDENT_AMBULATORY_CARE_PROVIDER_SITE_OTHER): Payer: BC Managed Care – PPO | Admitting: Family Medicine

## 2020-02-03 ENCOUNTER — Encounter: Payer: Self-pay | Admitting: Family Medicine

## 2020-02-03 VITALS — BP 128/80 | HR 71 | Temp 96.2°F | Resp 12 | Ht 62.5 in | Wt 189.0 lb

## 2020-02-03 DIAGNOSIS — Z130 Encounter for screening for diseases of the blood and blood-forming organs and certain disorders involving the immune mechanism: Secondary | ICD-10-CM

## 2020-02-03 DIAGNOSIS — Z0000 Encounter for general adult medical examination without abnormal findings: Secondary | ICD-10-CM

## 2020-02-03 DIAGNOSIS — Z1329 Encounter for screening for other suspected endocrine disorder: Secondary | ICD-10-CM | POA: Diagnosis not present

## 2020-02-03 DIAGNOSIS — R0782 Intercostal pain: Secondary | ICD-10-CM

## 2020-02-03 DIAGNOSIS — Z13228 Encounter for screening for other metabolic disorders: Secondary | ICD-10-CM | POA: Diagnosis not present

## 2020-02-03 DIAGNOSIS — N1831 Chronic kidney disease, stage 3a: Secondary | ICD-10-CM | POA: Diagnosis not present

## 2020-02-03 DIAGNOSIS — I10 Essential (primary) hypertension: Secondary | ICD-10-CM | POA: Diagnosis not present

## 2020-02-03 DIAGNOSIS — E559 Vitamin D deficiency, unspecified: Secondary | ICD-10-CM | POA: Diagnosis not present

## 2020-02-03 DIAGNOSIS — Z23 Encounter for immunization: Secondary | ICD-10-CM

## 2020-02-03 DIAGNOSIS — K219 Gastro-esophageal reflux disease without esophagitis: Secondary | ICD-10-CM

## 2020-02-03 LAB — POCT GLYCOSYLATED HEMOGLOBIN (HGB A1C): Hemoglobin A1C: 5 % (ref 4.0–5.6)

## 2020-02-03 NOTE — Patient Instructions (Signed)
Today you have you routine preventive visit.  Routine general medical examination at a health care facility  Essential hypertension  Vitamin D deficiency - Plan: VITAMIN D 25 Hydroxy (Vit-D Deficiency, Fractures)  Stage 3a chronic kidney disease - Plan: Microalbumin / creatinine urine ratio, Basic metabolic panel  Screening for endocrine, metabolic and immunity disorder  Gastroesophageal reflux disease, unspecified whether esophagitis present  Intercostal pain - Plan: CT Chest Wo Contrast   At least 150 minutes of moderate exercise per week, daily brisk walking for 15-30 min is a good exercise option. Healthy diet low in saturated (animal) fats and sweets and consisting of fresh fruits and vegetables, lean meats such as fish and white chicken and whole grains.  These are some of recommendations for screening depending of age and risk factors:   - Vaccines:  Tdap vaccine every 10 years.  Shingles vaccine recommended at age 75, could be given after 63 years of age but not sure about insurance coverage.   Pneumonia vaccines:  Prevnar 13 at 65 and Pneumovax at 58. Sometimes Pneumovax is giving earlier if history of smoking, lung disease,diabetes,kidney disease among some.    Screening for diabetes at age 1 and every 3 years.  Cervical cancer prevention:  Pap smear starts at 63 years of age and continues periodically until 63 years old in low risk women. Pap smear every 3 years between 57 and 2 years old. Pap smear every 3-5 years between women 74 and older if pap smear negative and HPV screening negative.   -Breast cancer: Mammogram: There is disagreement between experts about when to start screening in low risk asymptomatic female but recent recommendations are to start screening at 69 and not later than 63 years old , every 1-2 years and after 63 yo q 2 years. Screening is recommended until 63 years old but some women can continue screening depending of healthy  issues.   Colon cancer screening: starts at 63 years old until 63 years old.  Cholesterol disorder screening at age 56 and every 3 years.  Also recommended:  1. Dental visit- Brush and floss your teeth twice daily; visit your dentist twice a year. 2. Eye doctor- Get an eye exam at least every 2 years. 3. Helmet use- Always wear a helmet when riding a bicycle, motorcycle, rollerblading or skateboarding. 4. Safe sex- If you may be exposed to sexually transmitted infections, use a condom. 5. Seat belts- Seat belts can save your live; always wear one. 6. Smoke/Carbon Monoxide detectors- These detectors need to be installed on the appropriate level of your home. Replace batteries at least once a year. 7. Skin cancer- When out in the sun please cover up and use sunscreen 15 SPF or higher. 8. Violence- If anyone is threatening or hurting you, please tell your healthcare provider.  9. Drink alcohol in moderation- Limit alcohol intake to one drink or less per day. Never drink and drive.

## 2020-02-03 NOTE — Progress Notes (Signed)
HPI:  Cynthia Berger is a 64 y.o. female, who is here today for her routine physical.  Last CPE: 01/30/19 No new problems sine her last visit.  Regular exercise 3 or more time per week: Not consistently, once per week walking or walking in placed. Following a healthy diet: She is trying to eat better because she was having GERD symptoms (food stuck in thoat). She is on Omeprazole 20 mg and it is helping. She lives with her husband.  Chronic medical problems: HTN,vit D deficiency,OSA,multinodular goiter,and hx of renal cancer s/p right nephrectomy among some. She follows with Dr Loanne Drilling, endocrinologist and sees urologists q 3 months.  Pap smear: S/p hysterectomy. She follows with her gyn regularly, last seen 12/23/19.  Immunization History  Administered Date(s) Administered  . Influenza,inj,Quad PF,6+ Mos 10/24/2017, 01/03/2019, 02/03/2020  . Influenza-Unspecified 09/10/2013  . Pneumococcal Polysaccharide-23 10/24/2017  . Tdap 06/09/2014    Mammogram: 01/31/19. She received letter and planning on scheduling appt. Colonoscopy: 12/25/17. DEXA: 01/09/20, ordered by her gyn.  Hep C screening: 10/2017.  Concerns today: Left side pain, post rib cage, started in 11/2016. Sharp pain,interminttent, exacerbated by some movements and by a tight bra. Problem is stable. She has not identified alleviating factors. Negative for night sweats or abnormal wt loss.  No associated cough,wheezing,or SOB.  Rib X [place] in 11/2016: Negative.  Vitamin D deficiency: Currently she is on vitamin D 2000 units.  Hypertension and CKD III:She is on Amlodipine 5 mg daily and Metoprolol Succinate 25 mg daily. Negative for frequent/severe headache.  Echo in 03/2012 LVEF 45-50%, diffuse hypokinesis.  Lab Results  Component Value Date   CREATININE 1.21 (H) 01/30/2019   BUN 21 01/30/2019   NA 140 01/30/2019   K 4.9 01/30/2019   CL 105 01/30/2019   CO2 28 01/30/2019   Lab Results  Component Value  Date   MICROALBUR <0.7 10/24/2017   MICROALBUR <0.7 03/24/2016   She has not noted gross hematuria,foam in urine,or decreased urine output.   Review of Systems  Constitutional: Negative for appetite change, fatigue and fever.  HENT: Positive for congestion and rhinorrhea. Negative for hearing loss, mouth sores and sore throat.   Eyes: Negative for redness and visual disturbance.  Cardiovascular: Negative for chest pain and leg swelling.  Gastrointestinal: Negative for abdominal pain, nausea and vomiting.       No changes in bowel habits.  Endocrine: Negative for cold intolerance, heat intolerance, polydipsia, polyphagia and polyuria.  Genitourinary: Negative for dysuria, vaginal bleeding and vaginal discharge.  Musculoskeletal: Negative for gait problem and myalgias.  Skin: Negative for color change and rash.  Allergic/Immunologic: Positive for environmental allergies.  Neurological: Negative for syncope, weakness and headaches.  Hematological: Negative for adenopathy. Does not bruise/bleed easily.  Psychiatric/Behavioral: Negative for confusion.  All other systems reviewed and are negative.   Current Outpatient Medications on File Prior to Visit  Medication Sig Dispense Refill  . amLODipine (NORVASC) 5 MG tablet TAKE 1 TABLET BY MOUTH EVERY DAY 90 tablet 0  . cholecalciferol (VITAMIN D) 1000 UNITS tablet Take 1,000 Units by mouth daily.    . diclofenac sodium (VOLTAREN) 1 % GEL Apply 4 g topically 4 (four) times daily. 4 Tube 3  . esomeprazole (NEXIUM) 20 MG packet Take 20 mg by mouth daily before breakfast.    . metoprolol succinate (TOPROL-XL) 25 MG 24 hr tablet TAKE 1 TABLET BY MOUTH EVERY DAY 90 tablet 0  . Multiple Vitamins-Minerals (MULTIVITAMIN PO) Take 1  tablet by mouth daily.     Marland Kitchen OVER THE COUNTER MEDICATION Probiotic capsule. One capsule daily.    . [DISCONTINUED] dicyclomine (BENTYL) 20 MG tablet Take 1 tablet (20 mg total) by mouth 2 (two) times daily. 40 tablet 0    No current facility-administered medications on file prior to visit.     Past Medical History:  Diagnosis Date  . AC (acromioclavicular) joint bone spurs    HEEL BONE SPURS-SURGERY 12/2010 LEFT FOOT  . Antral gastritis   . Cancer ([place])   . 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 DDD (degenerative disc disease), cervical   . Esophageal dysmotility   . GERD (gastroesophageal reflux disease)   . Heart palpitations   . Hiatal hernia   . Hypertension   . LGSIL (low grade squamous intraepithelial dysplasia) 2013    C&B Inadequate ECC--positive LGSIL --  . Multinodular goiter    RIGHT LOBE-- PER BX NEGATIVE  . Positive H. pylori test   . Vitamin D deficiency 11/2007   LOW AT 18    Past Surgical History:  Procedure Laterality Date  . ABDOMINAL HYSTERECTOMY N/A 05/28/2013   Procedure: HYSTERECTOMY ABDOMINAL;  Surgeon: Anastasio Auerbach, MD;  Location: [facility];  Service: Gynecology;  Laterality: N/A;  . BIOPSY THYROID Right 2014   ENLARGED THYROID NO MEDS BENIGN  . CERVICAL BIOPSY  W/ LOOP ELECTRODE EXCISION  10/2012   for persistent low-grade dysplasia  . CESAREAN SECTION  1993   W/ TUBAL LIGATION, BILATERAL  . CYSTOSCOPY N/A 05/28/2013   Procedure: CYSTOSCOPY;  Surgeon: Anastasio Auerbach, MD;  Location: [facility];  Service: Gynecology;  Laterality: N/A;  . esophogeal dilatation    . FOOT SURGERY  2012   left heel spur removed  . HYSTEROSCOPY WITH D & C  06/08/2012   Procedure: DILATATION AND CURETTAGE /HYSTEROSCOPY;  Surgeon: Anastasio Auerbach, MD;  Location: [facility];  Service: Gynecology;  Laterality: N/A;  WITH RESECTION OF MYOMA  . NEPHRECTOMY  2005   RIGHT KIDNEY CANCER  . ORIF TOE FRACTURE Right 08/15/2018   Procedure: RIGHT FIFTH TOE OSTEOTOMY AND PINNING;  Surgeon: Marybelle Killings, MD;  Location: [facility];  Service: Orthopedics;  Laterality: Right;  . ROTATOR CUFF REPAIR  2010   RIGHT  . s/p Right nephrectomy  Right   . SALPINGOOPHORECTOMY Bilateral 05/28/2013   Procedure: BILATERAL SALPINGO OOPHORECTOMY;  Surgeon: Anastasio Auerbach, MD;  Location: [facility];  Service: Gynecology;  Laterality: Bilateral;  . TOE SURGERY  2020   baby toe on right foot  . TRANSTHORACIC ECHOCARDIOGRAM  04-10-2012  DR BRIEN CRENSHAW   LVSF MILDLY REDUCED/ EF 45-50%  . TUBAL LIGATION      Allergies  Allergen Reactions  . Penicillins Rash    Family History  Problem Relation Age of Onset  . Hypertension Mother   . Hypertension Sister   . Hypertension Son   . Colon cancer Neg Hx   . Esophageal cancer Neg Hx   . Rectal cancer Neg Hx   . Stomach cancer Neg Hx   . Breast cancer Neg Hx     Social History   Socioeconomic History  . Marital status: Married    Spouse name: Not on file  . Number of children: 4  . Years of education: Not on file  . Highest education level: Not on file  Occupational History  . Occupation: [employer]  Employer: [employer]  Tobacco Use  . Smoking status: Never Smoker  . Smokeless tobacco: Never Used  Substance and Sexual Activity  . Alcohol use: Not Currently    Alcohol/week: 0.0 standard drinks    Comment: occasional wine  . Drug use: No  . Sexual activity: Yes    Partners: Male    Birth control/protection: Post-menopausal    Comment: INTERCOURSE AGE 86, SEXUAL PARTNERS LESS THAN 5  Other Topics Concern  . Not on file  Social History Narrative  . Not on file   Social Determinants of Health   Financial Resource Strain:   . Difficulty of Paying Living Expenses: Not on file  Food Insecurity:   . Worried About Charity fundraiser in the Last Year: Not on file  . Ran Out of Food in the Last Year: Not on file  Transportation Needs:   . Lack of Transportation (Medical): Not on file  . Lack of Transportation (Non-Medical): Not on file  Physical Activity:   . Days of Exercise per Week: Not on file  . Minutes of Exercise per Session: Not on file  Stress:   .  Feeling of Stress : Not on file  Social Connections:   . Frequency of Communication with Friends and Family: Not on file  . Frequency of Social Gatherings with Friends and Family: Not on file  . Attends Religious Services: Not on file  . Active Member of Clubs or Organizations: Not on file  . Attends Archivist Meetings: Not on file  . Marital Status: Not on file    Vitals:   02/03/20 0910  BP: 128/80  Pulse: 71  Resp: 12  Temp: (!) 96.2 F (35.7 C)  SpO2: 99%   Body mass index is 34.02 kg/m.   Wt Readings from Last 3 Encounters:  02/03/20 189 lb (85.7 kg)  01/10/20 189 lb (85.7 kg)  12/23/19 186 lb 6.4 oz (84.6 kg)    Physical Exam  Nursing note and vitals reviewed. Constitutional: She is oriented to person, place, and time. She appears well-developed. No distress.  HENT:  Head: Normocephalic and atraumatic.  Right Ear: Hearing, tympanic membrane, external ear and ear canal normal.  Left Ear: Hearing, tympanic membrane, external ear and ear canal normal.  Mouth/Throat: Uvula is midline, oropharynx is clear and moist and mucous membranes are normal.  Eyes: Pupils are equal, round, and reactive to light. Conjunctivae and EOM are normal.  Neck: No tracheal deviation present. No thyromegaly present.  Cardiovascular: Normal rate and regular rhythm.  No murmur heard. Pulses:      Dorsalis pedis pulses are 2+ on the right side and 2+ on the left side.  Respiratory: Effort normal and breath sounds normal. No respiratory distress. She exhibits no tenderness.  GI: Soft. She exhibits no mass. There is no hepatomegaly. There is no abdominal tenderness.  Genitourinary:    Genitourinary Comments: Deferred to gyn.   Musculoskeletal:        General: No edema.     Thoracic back: No tenderness or bony tenderness.     Comments: No major deformity or signs of synovitis appreciated.  Lymphadenopathy:    She has no cervical adenopathy.       Right: No supraclavicular  adenopathy present.       Left: No supraclavicular adenopathy present.  Neurological: She is alert and oriented to person, place, and time. She has normal strength. No cranial nerve deficit. Coordination and gait normal.  Reflex  Scores:      Bicep reflexes are 2+ on the right side and 2+ on the left side.      Patellar reflexes are 2+ on the right side and 2+ on the left side. Skin: Skin is warm. No rash noted. No erythema.  Psychiatric: She has a normal mood and affect. Cognition and memory are normal.  Well groomed, good eye contact.    ASSESSMENT AND PLAN:  Ms. Suri Buchanan was here today annual physical examination.   Orders Placed This Encounter  Procedures  . CT Chest Wo Contrast  . Flu Vaccine QUAD 36+ mos IM  . Microalbumin / creatinine urine ratio  . VITAMIN D 25 Hydroxy (Vit-D Deficiency, Fractures)  . Basic metabolic panel  . POC HgB A1c    Lab Results  Component Value Date   HGBA1C 5.0 02/03/2020    Routine general medical examination at a health care facility We discussed the importance of regular physical activity and healthy diet for prevention of chronic illness and/or complications. Preventive guidelines reviewed. Vaccination up to date.  Ca++ and vit D supplementation to continue. Next CPE in a year.  Essential hypertension BP adequately controlled. No changes in current management. Continue low salt diet. Possible complications of elevated BP discussed.  Vitamin D deficiency No changes in current management. Further recommendations according to 25 OH vit D results.  Stage 3a chronic kidney disease Problem has been stable. Low salt diet and adequate hydration. Avoid NSAID's. Adequate BP controlled.  Screening for endocrine, metabolic and immunity disorder -     POC HgB A1c  Gastroesophageal reflux disease, unspecified whether esophagitis present Problem is well controlled. Continue Omeprazole 20 mg daily prn.  Intercostal pain Pain is  not reproducible on examination. Pain since 2017 + hx of renal cancer, so chest CT will be arranged.  -     CT Chest Wo Contrast; Future  Need for influenza vaccination -     Flu Vaccine QUAD 36+ mos IM   She will come back later this week for labs, we do not have lab availability.   Return in 1 year (on 02/02/2021) for cpe and f/u.    Zareth Rippetoe G. Martinique, MD  [facility]. [place] office.

## 2020-02-05 ENCOUNTER — Encounter: Payer: Self-pay | Admitting: Family Medicine

## 2020-02-05 ENCOUNTER — Other Ambulatory Visit: Payer: Self-pay

## 2020-02-06 ENCOUNTER — Other Ambulatory Visit: Payer: BC Managed Care – PPO

## 2020-02-11 ENCOUNTER — Ambulatory Visit
Admission: RE | Admit: 2020-02-11 | Discharge: 2020-02-11 | Disposition: A | Discharge status: Home / Self Care | Payer: BC Managed Care – PPO | Source: Ambulatory Visit | Attending: Women's Health | Admitting: Women's Health

## 2020-02-11 ENCOUNTER — Other Ambulatory Visit: Payer: Self-pay

## 2020-02-11 DIAGNOSIS — Z1231 Encounter for screening mammogram for malignant neoplasm of breast: Secondary | ICD-10-CM | POA: Diagnosis not present

## 2020-02-12 ENCOUNTER — Other Ambulatory Visit: Payer: Self-pay

## 2020-02-13 ENCOUNTER — Other Ambulatory Visit: Payer: BC Managed Care – PPO

## 2020-04-06 DIAGNOSIS — C641 Malignant neoplasm of right kidney, except renal pelvis: Secondary | ICD-10-CM | POA: Diagnosis not present

## 2020-04-13 ENCOUNTER — Other Ambulatory Visit: Payer: Self-pay | Admitting: Family Medicine

## 2020-04-13 DIAGNOSIS — I10 Essential (primary) hypertension: Secondary | ICD-10-CM

## 2020-04-27 ENCOUNTER — Other Ambulatory Visit: Payer: Self-pay | Admitting: Family Medicine

## 2020-04-27 DIAGNOSIS — I10 Essential (primary) hypertension: Secondary | ICD-10-CM

## 2020-07-09 ENCOUNTER — Other Ambulatory Visit: Payer: Self-pay | Admitting: Family Medicine

## 2020-07-09 DIAGNOSIS — I10 Essential (primary) hypertension: Secondary | ICD-10-CM

## 2020-07-20 ENCOUNTER — Other Ambulatory Visit: Payer: Self-pay | Admitting: Family Medicine

## 2020-07-20 DIAGNOSIS — I10 Essential (primary) hypertension: Secondary | ICD-10-CM

## 2020-07-20 DIAGNOSIS — C641 Malignant neoplasm of right kidney, except renal pelvis: Secondary | ICD-10-CM | POA: Diagnosis not present

## 2020-08-14 ENCOUNTER — Other Ambulatory Visit: Payer: Self-pay

## 2020-08-14 ENCOUNTER — Ambulatory Visit (INDEPENDENT_AMBULATORY_CARE_PROVIDER_SITE_OTHER): Payer: BC Managed Care – PPO | Admitting: Family Medicine

## 2020-08-14 ENCOUNTER — Encounter: Payer: Self-pay | Admitting: Family Medicine

## 2020-08-14 VITALS — BP 130/64 | HR 80 | Temp 99.0°F | Wt 203.8 lb

## 2020-08-14 DIAGNOSIS — G5602 Carpal tunnel syndrome, left upper limb: Secondary | ICD-10-CM | POA: Diagnosis not present

## 2020-08-14 NOTE — Progress Notes (Signed)
   Subjective:    Patient ID: Cynthia Berger, female    DOB: 17-Jul-1956, 64 y.o.   MRN: [id number]  HPI Here for one month of tingling and numbness in the entire left hand. There is no pain. 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    Review of Systems  Constitutional: Negative.   Respiratory: Negative.   Cardiovascular: Negative.   Neurological: Positive for numbness.       Objective:   Physical Exam Constitutional:      Appearance: Normal appearance. She is not ill-appearing.  Cardiovascular:     Rate and Rhythm: Normal rate and regular rhythm.     Pulses: Normal pulses.     Heart sounds: Normal heart sounds.  Pulmonary:     Effort: Pulmonary effort is normal.     Breath sounds: Normal breath sounds.  Musculoskeletal:     Comments: The left hand appears normal, skin is warm, pulses are full. Grip strength is normal, no tenderness   Neurological:     Mental Status: She is alert.           Assessment & Plan:  This is likely due to carpal tunnel syndrome. She will begin wearing a wrist splint 24 hours a day. She will follow up with Dr. Martinique on Monday for a previously scheduled visit.  Alysia Penna, MD

## 2020-08-17 ENCOUNTER — Ambulatory Visit: Payer: BC Managed Care – PPO | Admitting: Family Medicine

## 2020-08-17 ENCOUNTER — Other Ambulatory Visit: Payer: Self-pay

## 2020-08-17 ENCOUNTER — Ambulatory Visit (INDEPENDENT_AMBULATORY_CARE_PROVIDER_SITE_OTHER): Payer: BC Managed Care – PPO | Admitting: Family Medicine

## 2020-08-17 ENCOUNTER — Encounter: Payer: Self-pay | Admitting: Family Medicine

## 2020-08-17 VITALS — BP 130/80 | HR 100 | Resp 16 | Ht 62.5 in | Wt 202.0 lb

## 2020-08-17 DIAGNOSIS — R0782 Intercostal pain: Secondary | ICD-10-CM | POA: Diagnosis not present

## 2020-08-17 DIAGNOSIS — M549 Dorsalgia, unspecified: Secondary | ICD-10-CM

## 2020-08-17 DIAGNOSIS — G5602 Carpal tunnel syndrome, left upper limb: Secondary | ICD-10-CM

## 2020-08-17 DIAGNOSIS — R109 Unspecified abdominal pain: Secondary | ICD-10-CM | POA: Diagnosis not present

## 2020-08-17 MED ORDER — TIZANIDINE HCL 4 MG PO TABS: 2.0000 mg | ORAL_TABLET | Freq: Every day | ORAL | 0 refills | Status: AC

## 2020-08-17 NOTE — Patient Instructions (Addendum)
A few things to remember from today's visit:  Seems to be stable since 2017, which is reassuring. We will try to find out what happened with CT. If left hand still not better after wearing splint for 4-6 weeks, please ,let me now.  Zanaflex may help with upper back pain.  If you need refills please call your pharmacy. Do not use My Chart to request refills or for acute issues that need immediate attention.    Please be sure medication list is accurate. If a new problem present, please set up appointment sooner than planned today.

## 2020-08-17 NOTE — Progress Notes (Signed)
Chief Complaint  Patient presents with  . Back Pain   HPI:  [name] is a 64 y.o. female with history of hypertension, GERD, and CKD here today complaining of persistent left-sided back pain, posterior rib cage. She was seen for same problem in 11/2016 and on 02/03/2020. Last visit a chest CT was ordered, she had not received an appointment. Plain rib x-[place] done on 11/28/2016 was negative for bone abnormality. History of right renal cell carcinoma in 2009, s/p nephrectomy.  She has not noted abnormal weight loss, fever, or night sweats.  Pain is mild, dull and sometimes sharp, no radiated. It seems to be happening more frequent. She has not identified exacerbating or alleviating factors. She has not noted a skin rash, erythema, or edema. Negative for cough, wheezing, dyspnea, and CP.  For the past few days she has had some burning-like sensation left-sided upper back.  Negative for recent injury or unusual level of activity.  She is also complaining about left hand numbness. She was seen recently, 08/14/2020, diagnosed with carpal tunnel syndrome. A wrist splint was recommended, she just got it. She has not noted joint edema/erythema. Sometimes she feels like left upper extremity is weak, alleviated by shoulder range of motion exercises.  There is no limitation of range of motion.  Review of Systems  Constitutional: Negative for activity change, appetite change and fatigue.  HENT: Negative for mouth sores and sore throat.   Respiratory: Negative for cough and wheezing.   Cardiovascular: Negative for leg swelling.  Gastrointestinal: Negative for abdominal pain, nausea and vomiting.  Genitourinary: Negative for decreased urine volume, dysuria and hematuria.  Musculoskeletal: Positive for arthralgias. Negative for gait problem.  Neurological: Negative for syncope, weakness and headaches.  Hematological: Negative for adenopathy. Does not bruise/bleed easily.  Rest see  pertinent positives and negatives per HPI.  Current Outpatient Medications on File Prior to Visit  Medication Sig Dispense Refill  . amLODipine (NORVASC) 5 MG tablet TAKE 1 TABLET BY MOUTH EVERY DAY 90 tablet 3  . cholecalciferol (VITAMIN D) 1000 UNITS tablet Take 1,000 Units by mouth daily.    . diclofenac sodium (VOLTAREN) 1 % GEL Apply 4 g topically 4 (four) times daily. 4 Tube 3  . esomeprazole (NEXIUM) 20 MG packet Take 20 mg by mouth daily before breakfast.    . metoprolol succinate (TOPROL-XL) 25 MG 24 hr tablet TAKE 1 TABLET BY MOUTH EVERY DAY 90 tablet 2  . Multiple Vitamins-Minerals (MULTIVITAMIN PO) Take 1 tablet by mouth daily.     Marland Kitchen OVER THE COUNTER MEDICATION Probiotic capsule. One capsule daily.    . [DISCONTINUED] dicyclomine (BENTYL) 20 MG tablet Take 1 tablet (20 mg total) by mouth 2 (two) times daily. 40 tablet 0   No current facility-administered medications on file prior to visit.   Past Medical History:  Diagnosis Date  . AC (acromioclavicular) joint bone spurs    HEEL BONE SPURS-SURGERY 12/2010 LEFT FOOT  . Antral gastritis   . Cancer ([place])   . 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 DDD (degenerative disc disease), cervical   . Esophageal dysmotility   . GERD (gastroesophageal reflux disease)   . Heart palpitations   . Hiatal hernia   . Hypertension   . LGSIL (low grade squamous intraepithelial dysplasia) 2013    C&B Inadequate ECC--positive LGSIL --  . Multinodular goiter    RIGHT LOBE-- PER BX NEGATIVE  . Positive H. pylori test   .  Vitamin D deficiency 11/2007   LOW AT 18   Allergies  Allergen Reactions  . Penicillins Rash   Social History   Socioeconomic History  . Marital status: Married    Spouse name: Not on file  . Number of children: 4  . Years of education: Not on file  . Highest education level: Not on file  Occupational History  . Occupation: [employer]    Employer: [employer]  Tobacco Use  .  Smoking status: Never Smoker  . Smokeless tobacco: Never Used  Vaping Use  . Vaping Use: Never used  Substance and Sexual Activity  . Alcohol use: Not Currently    Alcohol/week: 0.0 standard drinks    Comment: occasional wine  . Drug use: No  . Sexual activity: Yes    Partners: Male    Birth control/protection: Post-menopausal    Comment: INTERCOURSE AGE 45, SEXUAL PARTNERS LESS THAN 5  Other Topics Concern  . Not on file  Social History Narrative  . Not on file   Social Determinants of Health   Financial Resource Strain:   . Difficulty of Paying Living Expenses: Not on file  Food Insecurity:   . Worried About Charity fundraiser in the Last Year: Not on file  . Ran Out of Food in the Last Year: Not on file  Transportation Needs:   . Lack of Transportation (Medical): Not on file  . Lack of Transportation (Non-Medical): Not on file  Physical Activity:   . Days of Exercise per Week: Not on file  . Minutes of Exercise per Session: Not on file  Stress:   . Feeling of Stress : Not on file  Social Connections:   . Frequency of Communication with Friends and Family: Not on file  . Frequency of Social Gatherings with Friends and Family: Not on file  . Attends Religious Services: Not on file  . Active Member of Clubs or Organizations: Not on file  . Attends Archivist Meetings: Not on file  . Marital Status: Not on file    Vitals:   08/17/20 0758  BP: 130/80  Pulse: 100  Resp: 16  SpO2: 96%   Body mass index is 36.36 kg/m.  Physical Exam Vitals and nursing note reviewed.  Constitutional:      General: She is not in acute distress.    Appearance: She is well-developed. She is not ill-appearing.  HENT:     Head: Atraumatic.  Eyes:     Conjunctiva/sclera: Conjunctivae normal.     Pupils: Pupils are equal, round, and reactive to light.  Cardiovascular:     Rate and Rhythm: Normal rate and regular rhythm.     Heart sounds: Murmur (? Soft SEM RUSB) heard.    Pulmonary:     Effort: Pulmonary effort is normal. No respiratory distress.     Breath sounds: Normal breath sounds.  Abdominal:     Palpations: Abdomen is soft. There is no hepatomegaly or mass.     Tenderness: There is no abdominal tenderness.  Musculoskeletal:     Thoracic back: No spasms, tenderness or bony tenderness.     Lumbar back: No tenderness or bony tenderness.       Back:     Comments: No significant deformity appreciated.  Pain is not elicited with movement on exam table during examination. No local edema or erythema appreciated, no suspicious lesions.    Lymphadenopathy:     Cervical: No cervical adenopathy.  Skin:  General: Skin is warm.     Findings: No erythema or rash.  Neurological:     General: No focal deficit present.     Mental Status: She is alert and oriented to person, place, and time.     Coordination: Coordination normal.     Gait: Gait normal.  Psychiatric:        Mood and Affect: Affect normal.     Comments: Well groomed, good eye contact.   ASSESSMENT AND PLAN:  Ms.Anael was seen today for back pain.  Diagnoses and all orders for this visit: Orders Placed This Encounter  Procedures  . Urinalysis, Routine w reflex microscopic    Intercostal pain Problem is chronic and overall stable in regard to intensity but becoming more frequent. Explained that most likely it is musculoskeletal pain, which may never resolve. Because history of renal carcinoma, I think is appropriate to have imaging done. We will try to find out why appointment for chest CT was not arranged. Topical IcyHot or Aspercreme may help. Instructed about warning signs.  Carpal tunnel syndrome of left wrist Recommend waiting for wrist splint at night and if not improvement in 4 to 6 weeks, sports medicine appointment can be arranged to discuss other treatment options.  Upper back pain on left side No history of trauma. I will think imaging needed at this time. She  agrees with trial of muscle relaxant, some side effect discussed.  -     tiZANidine (ZANAFLEX) 4 MG tablet; Take 0.5-1 tablets (2-4 mg total) by mouth at bedtime for 21 days.  Return if symptoms worsen or fail to improve.   Thea Holshouser G. Martinique, MD  [facility]. [place] office.  A few things to remember from today's visit:  Seems to be stable since 2017, which is reassuring. We will try to find out what happened with CT. If left hand still not better after wearing splint for 4-6 weeks, please ,let me now.  Zanaflex may help with upper back pain.  If you need refills please call your pharmacy. Do not use My Chart to request refills or for acute issues that need immediate attention.    Please be sure medication list is accurate. If a new problem present, please set up appointment sooner than planned today.

## 2020-08-18 ENCOUNTER — Telehealth: Payer: Self-pay | Admitting: Family Medicine

## 2020-08-18 DIAGNOSIS — R0782 Intercostal pain: Secondary | ICD-10-CM

## 2020-08-18 LAB — URINALYSIS, ROUTINE W REFLEX MICROSCOPIC
Bacteria, UA: NONE SEEN /HPF
Bilirubin Urine: NEGATIVE
Glucose, UA: NEGATIVE
Hyaline Cast: NONE SEEN /LPF
Ketones, ur: NEGATIVE
Leukocytes,Ua: NEGATIVE
Nitrite: NEGATIVE
Protein, ur: NEGATIVE
Specific Gravity, Urine: 1.022 (ref 1.001–1.03)
WBC, UA: NONE SEEN /HPF (ref 0–5)
pH: 5.5 (ref 5.0–8.0)

## 2020-08-18 NOTE — Telephone Encounter (Signed)
Okay to add order for CT of abdomen? I don't see one that will get both her chest & side.

## 2020-08-18 NOTE — Addendum Note (Signed)
Addended by: Rodrigo Ran on: 08/18/2020 04:33 PM   Modules accepted: Orders

## 2020-08-18 NOTE — Telephone Encounter (Signed)
New  CT order placed

## 2020-08-18 NOTE — Telephone Encounter (Signed)
The patient contacted [facility] and they told her that the chest CT is not going to show her side. She is needing a new order placed for Side CT.  Please advise

## 2020-08-18 NOTE — Telephone Encounter (Signed)
It is ok to replace for abdominal CT. Her pain in left posterior rib cage,thoracic, I think it can also be seen on abdominal CT. Thanks, BJ

## 2020-09-02 ENCOUNTER — Ambulatory Visit
Admission: RE | Admit: 2020-09-02 | Discharge: 2020-09-02 | Disposition: A | Discharge status: Home / Self Care | Payer: BC Managed Care – PPO | Source: Ambulatory Visit | Attending: Family Medicine | Admitting: Family Medicine

## 2020-09-02 ENCOUNTER — Other Ambulatory Visit: Payer: Self-pay

## 2020-09-02 DIAGNOSIS — R0782 Intercostal pain: Secondary | ICD-10-CM

## 2020-09-02 DIAGNOSIS — R109 Unspecified abdominal pain: Secondary | ICD-10-CM | POA: Diagnosis not present

## 2020-09-02 DIAGNOSIS — Z905 Acquired absence of kidney: Secondary | ICD-10-CM | POA: Diagnosis not present

## 2020-09-02 DIAGNOSIS — N289 Disorder of kidney and ureter, unspecified: Secondary | ICD-10-CM | POA: Diagnosis not present

## 2020-10-19 DIAGNOSIS — C641 Malignant neoplasm of right kidney, except renal pelvis: Secondary | ICD-10-CM | POA: Diagnosis not present

## 2020-11-19 DIAGNOSIS — M67371 Transient synovitis, right ankle and foot: Secondary | ICD-10-CM | POA: Diagnosis not present

## 2020-11-19 DIAGNOSIS — M19071 Primary osteoarthritis, right ankle and foot: Secondary | ICD-10-CM | POA: Diagnosis not present

## 2020-11-26 DIAGNOSIS — M67371 Transient synovitis, right ankle and foot: Secondary | ICD-10-CM | POA: Diagnosis not present

## 2020-12-03 DIAGNOSIS — M67371 Transient synovitis, right ankle and foot: Secondary | ICD-10-CM | POA: Diagnosis not present

## 2020-12-10 DIAGNOSIS — M67371 Transient synovitis, right ankle and foot: Secondary | ICD-10-CM | POA: Diagnosis not present

## 2020-12-10 DIAGNOSIS — M7751 Other enthesopathy of right foot: Secondary | ICD-10-CM | POA: Diagnosis not present

## 2020-12-12 ENCOUNTER — Emergency Department (HOSPITAL_BASED_OUTPATIENT_CLINIC_OR_DEPARTMENT_OTHER)
Admission: EM | Admit: 2020-12-12 | Discharge: 2020-12-12 | Disposition: A | Discharge status: Home / Self Care | Payer: BC Managed Care – PPO | Attending: Emergency Medicine | Admitting: Emergency Medicine

## 2020-12-12 ENCOUNTER — Encounter (HOSPITAL_BASED_OUTPATIENT_CLINIC_OR_DEPARTMENT_OTHER): Payer: Self-pay | Admitting: *Deleted

## 2020-12-12 ENCOUNTER — Other Ambulatory Visit: Payer: Self-pay

## 2020-12-12 ENCOUNTER — Emergency Department (HOSPITAL_BASED_OUTPATIENT_CLINIC_OR_DEPARTMENT_OTHER): Payer: BC Managed Care – PPO

## 2020-12-12 DIAGNOSIS — Z85828 Personal history of other malignant neoplasm of skin: Secondary | ICD-10-CM | POA: Diagnosis not present

## 2020-12-12 DIAGNOSIS — K219 Gastro-esophageal reflux disease without esophagitis: Secondary | ICD-10-CM | POA: Insufficient documentation

## 2020-12-12 DIAGNOSIS — N183 Chronic kidney disease, stage 3 unspecified: Secondary | ICD-10-CM | POA: Diagnosis not present

## 2020-12-12 DIAGNOSIS — Z79899 Other long term (current) drug therapy: Secondary | ICD-10-CM | POA: Insufficient documentation

## 2020-12-12 DIAGNOSIS — I129 Hypertensive chronic kidney disease with stage 1 through stage 4 chronic kidney disease, or unspecified chronic kidney disease: Secondary | ICD-10-CM | POA: Insufficient documentation

## 2020-12-12 DIAGNOSIS — I10 Essential (primary) hypertension: Secondary | ICD-10-CM

## 2020-12-12 DIAGNOSIS — R109 Unspecified abdominal pain: Secondary | ICD-10-CM | POA: Diagnosis not present

## 2020-12-12 LAB — URINALYSIS, ROUTINE W REFLEX MICROSCOPIC
Bilirubin Urine: NEGATIVE
Glucose, UA: NEGATIVE mg/dL
Hgb urine dipstick: NEGATIVE
Ketones, ur: NEGATIVE mg/dL
Leukocytes,Ua: NEGATIVE
Nitrite: NEGATIVE
Protein, ur: NEGATIVE mg/dL
Specific Gravity, Urine: 1.01 (ref 1.005–1.030)
pH: 6 (ref 5.0–8.0)

## 2020-12-12 MED ORDER — HYDROCODONE-ACETAMINOPHEN 5-325 MG PO TABS: 1.0000 | ORAL_TABLET | Freq: Four times a day (QID) | ORAL | 0 refills | Status: DC | PRN

## 2020-12-12 MED ORDER — CYCLOBENZAPRINE HCL 10 MG PO TABS: 10.0000 mg | ORAL_TABLET | Freq: Two times a day (BID) | ORAL | 0 refills | Status: DC | PRN

## 2020-12-12 MED ORDER — NAPROXEN 375 MG PO TABS: 375.0000 mg | ORAL_TABLET | Freq: Two times a day (BID) | ORAL | 0 refills | Status: DC

## 2020-12-12 NOTE — ED Triage Notes (Addendum)
Left flank pain between Thursday or Friday.   No pain unless she turns or twist her body a certain way.

## 2020-12-12 NOTE — ED Provider Notes (Signed)
[facility] EMERGENCY DEPARTMENT Provider Note   CSN: [id number] Arrival date & time: 12/12/20  1104     History Chief Complaint  Patient presents with  . Flank Pain    Cynthia Berger is a 64 y.o. female.  HPI   Pt started having left flank pain a few days ago.  The pain is in the middle lower side area.  It increases with certain movements.  No shortness of breath. No fever or cough.  No trouble urinating. No abd pain.  No stomach pain.  Past Medical History:  Diagnosis Date  . AC (acromioclavicular) joint bone spurs    HEEL BONE SPURS-SURGERY 12/2010 LEFT FOOT  . Antral gastritis   . Cancer ([place])   . 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 DDD (degenerative disc disease), cervical   . Esophageal dysmotility   . GERD (gastroesophageal reflux disease)   . Heart palpitations   . Hiatal hernia   . Hypertension   . LGSIL (low grade squamous intraepithelial dysplasia) 2013    C&B Inadequate ECC--positive LGSIL --  . Multinodular goiter    RIGHT LOBE-- PER BX NEGATIVE  . Positive H. pylori test   . Vitamin D deficiency 11/2007   LOW AT 18    Patient Active Problem List   Diagnosis Date Noted  . Displaced fracture of proximal phalanx of right great toe with malunion 08/08/2018  . CKD (chronic kidney disease), stage III ([place]) 10/24/2017  . Hyperkalemia 03/24/2016  . Dizziness and giddiness 03/06/2015  . GERD (gastroesophageal reflux disease) 01/08/2014  . Cardiomyopathy 05/16/2012  . Neck pain on right side 04/20/2012  . Multinodular goiter 04/18/2012  . 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 Palpitations 03/07/2012  . Abdominal bruit 03/07/2012  . Hypertension 02/23/2012  . History of kidney cancer   . AC (acromioclavicular) joint bone spurs   . Death of family member   . Vitamin D deficiency 11/19/2007    Past Surgical History:  Procedure Laterality Date  . ABDOMINAL HYSTERECTOMY N/A 05/28/2013   Procedure: HYSTERECTOMY ABDOMINAL;   Surgeon: Anastasio Auerbach, MD;  Location: [facility];  Service: Gynecology;  Laterality: N/A;  . BIOPSY THYROID Right 2014   ENLARGED THYROID NO MEDS BENIGN  . CERVICAL BIOPSY  W/ LOOP ELECTRODE EXCISION  10/2012   for persistent low-grade dysplasia  . CESAREAN SECTION  1993   W/ TUBAL LIGATION, BILATERAL  . CYSTOSCOPY N/A 05/28/2013   Procedure: CYSTOSCOPY;  Surgeon: Anastasio Auerbach, MD;  Location: [facility];  Service: Gynecology;  Laterality: N/A;  . esophogeal dilatation    . FOOT SURGERY  2012   left heel spur removed  . HYSTEROSCOPY WITH D & C  06/08/2012   Procedure: DILATATION AND CURETTAGE /HYSTEROSCOPY;  Surgeon: Anastasio Auerbach, MD;  Location: [facility];  Service: Gynecology;  Laterality: N/A;  WITH RESECTION OF MYOMA  . NEPHRECTOMY  2005   RIGHT KIDNEY CANCER  . ORIF TOE FRACTURE Right 08/15/2018   Procedure: RIGHT FIFTH TOE OSTEOTOMY AND PINNING;  Surgeon: Marybelle Killings, MD;  Location: [facility];  Service: Orthopedics;  Laterality: Right;  . ROTATOR CUFF REPAIR  2010   RIGHT  . s/p Right nephrectomy Right   . SALPINGOOPHORECTOMY Bilateral 05/28/2013   Procedure: BILATERAL SALPINGO OOPHORECTOMY;  Surgeon: Anastasio Auerbach, MD;  Location: [facility];  Service: Gynecology;  Laterality: Bilateral;  . TOE SURGERY  2020   baby toe on  right foot  . TRANSTHORACIC ECHOCARDIOGRAM  04-10-2012  DR BRIEN CRENSHAW   LVSF MILDLY REDUCED/ EF 45-50%  . TUBAL LIGATION       OB History    Gravida  6   Para  4   Term      Preterm      AB  2   Living  4     SAB  2   IAB      Ectopic      Multiple      Live Births              Family History  Problem Relation Age of Onset  . Hypertension Mother   . Hypertension Sister   . Hypertension Son   . Colon cancer Neg Hx   . Esophageal cancer Neg Hx   . Rectal cancer Neg Hx   . Stomach cancer Neg Hx   . Breast cancer Neg Hx     Social History   Tobacco Use  . Smoking status: Never  Smoker  . Smokeless tobacco: Never Used  Vaping Use  . Vaping Use: Never used  Substance Use Topics  . Alcohol use: Not Currently    Alcohol/week: 0.0 standard drinks    Comment: occasional wine  . Drug use: No    Home Medications Prior to Admission medications   Medication Sig Start Date End Date Taking? Authorizing Provider  amLODipine (NORVASC) 5 MG tablet TAKE 1 TABLET BY MOUTH EVERY DAY 07/20/20  Yes Martinique, Betty G, MD  metoprolol succinate (TOPROL-XL) 25 MG 24 hr tablet TAKE 1 TABLET BY MOUTH EVERY DAY 07/10/20  Yes [place], Betty G, MD  Multiple Vitamins-Minerals (MULTIVITAMIN PO) Take 1 tablet by mouth daily.    Yes [provider]  cholecalciferol (VITAMIN D) 1000 UNITS tablet Take 1,000 Units by mouth daily.    [provider]  cyclobenzaprine (FLEXERIL) 10 MG tablet Take 1 tablet (10 mg total) by mouth 2 (two) times daily as needed for muscle spasms. 12/12/20   Dorie Rank, MD  diclofenac sodium (VOLTAREN) 1 % GEL Apply 4 g topically 4 (four) times daily. 01/30/19   [place], Betty G, MD  esomeprazole (NEXIUM) 20 MG packet Take 20 mg by mouth daily before breakfast.    [provider]  HYDROcodone-acetaminophen (NORCO/VICODIN) 5-325 MG tablet Take 1 tablet by mouth every 6 (six) hours as needed for severe pain. 12/12/20   Dorie Rank, MD  naproxen (NAPROSYN) 375 MG tablet Take 1 tablet (375 mg total) by mouth 2 (two) times daily. 12/12/20   Dorie Rank, MD  OVER THE COUNTER MEDICATION Probiotic capsule. One capsule daily.    [provider]  dicyclomine (BENTYL) 20 MG tablet Take 1 tablet (20 mg total) by mouth 2 (two) times daily. 10/21/11 03/07/12  Lafayette Dragon, MD    Allergies    Penicillins  Review of Systems   Review of Systems  All other systems reviewed and are negative.   Physical Exam Updated Vital Signs BP (!) 175/99 (BP Location: Right Arm)   Pulse 96   Temp (!) 97.4 F (36.3 C) (Oral)   Resp 16   Ht 1.6 m (5\' 3" )   Wt 81.6  kg   LMP 08/23/2006   SpO2 100%   BMI 31.89 kg/m   Physical Exam Vitals and nursing note reviewed.  Constitutional:      General: She is not in acute distress.    Appearance: She is well-developed and well-nourished.  HENT:     Head: Normocephalic and atraumatic.     Right Ear: External ear normal.     Left Ear: External ear normal.  Eyes:     General: No scleral icterus.       Right eye: No discharge.        Left eye: No discharge.     Conjunctiva/sclera: Conjunctivae normal.  Neck:     Trachea: No tracheal deviation.  Cardiovascular:     Rate and Rhythm: Normal rate and regular rhythm.     Pulses: Intact distal pulses.  Pulmonary:     Effort: Pulmonary effort is normal. No respiratory distress.     Breath sounds: Normal breath sounds. No stridor. No wheezing or rales.  Abdominal:     General: Bowel sounds are normal. There is no distension.     Palpations: Abdomen is soft.     Tenderness: There is no abdominal tenderness. There is no guarding or rebound.  Musculoskeletal:        General: No tenderness or edema.     Cervical back: Neck supple.  Skin:    General: Skin is warm and dry.     Findings: No lesion or rash.  Neurological:     Mental Status: She is alert.     Cranial Nerves: No cranial nerve deficit (no facial droop, extraocular movements intact, no slurred speech).     Sensory: No sensory deficit.     Motor: No abnormal muscle tone or seizure activity.     Coordination: Coordination normal.     Deep Tendon Reflexes: Strength normal.  Psychiatric:        Mood and Affect: Mood and affect normal.     ED Results / Procedures / Treatments   Labs (all labs ordered are listed, but only abnormal results are displayed) Labs Reviewed  URINALYSIS, ROUTINE W REFLEX MICROSCOPIC    EKG None  Radiology DG Ribs Unilateral W/Chest Left  Result Date: 12/12/2020 CLINICAL DATA:  Left rib pain EXAM: LEFT RIBS AND CHEST - 3+ VIEW COMPARISON:  11/28/2016 FINDINGS:  No fracture or other bone lesions are seen involving the ribs. There is no evidence of pneumothorax or pleural effusion. Both lungs are clear. Heart size and mediastinal contours are within normal limits. IMPRESSION: Negative. Electronically Signed   By: Davina Poke D.O.   On: 12/12/2020 12:22    Procedures Procedures (including critical care time)  Medications Ordered in ED Medications - No data to display  ED Course  I have reviewed the triage vital signs and the nursing notes.  Pertinent labs & imaging results that were available during my care of the patient were reviewed by me and considered in my medical decision making (see chart for details).  Clinical Course as of 12/12/20 1327  Sat Dec 12, 2020  1308 Urinalysis is negative.  Chest x-Vizzini is negative, ribs are negative [JK]    Clinical Course User Index [JK] Dorie Rank, MD   MDM Rules/Calculators/A&P                          Patient presented with complaints of left flank pain.  She is not having any breathing difficulty.  Doubt pulmonary etiology.  Chest x-Luhrs and rib x-rays are unremarkable.  Patient is not having any urinary discomfort or abdominal pain.  Urinalysis is negative.  Doubt ureteral colic pyelonephritis or urinary tract infection.  No rash on exam to suggest shingles.  Patient's pain is  positional.  I suspect musculoskeletal etiology.  Will discharge home with medications for pain.  Outpatient follow-up.  Pressure also elevated today.  Recommend follow-up with her primary care doctor. Final Clinical Impression(s) / ED Diagnoses Final diagnoses:  Flank pain  Hypertension, unspecified type    Rx / DC Orders ED Discharge Orders         Ordered    naproxen (NAPROSYN) 375 MG tablet  2 times daily        12/12/20 1323    cyclobenzaprine (FLEXERIL) 10 MG tablet  2 times daily PRN        12/12/20 1323    HYDROcodone-acetaminophen (NORCO/VICODIN) 5-325 MG tablet  Every 6 hours PRN        12/12/20 1323            Dorie Rank, MD 12/12/20 1327

## 2020-12-12 NOTE — Discharge Instructions (Signed)
Take the medications as needed for pain.  Follow-up with your doctor if the symptoms persist.  Your blood pressure was elevated today.  Follow-up with your doctor to have that rechecked.

## 2020-12-23 ENCOUNTER — Encounter: Payer: Self-pay | Admitting: Nurse Practitioner

## 2020-12-23 ENCOUNTER — Other Ambulatory Visit: Payer: Self-pay

## 2020-12-23 ENCOUNTER — Ambulatory Visit (INDEPENDENT_AMBULATORY_CARE_PROVIDER_SITE_OTHER): Payer: 59 | Admitting: Nurse Practitioner

## 2020-12-23 VITALS — BP 122/83 | Ht 62.0 in | Wt 199.6 lb

## 2020-12-23 DIAGNOSIS — Z90710 Acquired absence of both cervix and uterus: Secondary | ICD-10-CM

## 2020-12-23 DIAGNOSIS — Z01419 Encounter for gynecological examination (general) (routine) without abnormal findings: Secondary | ICD-10-CM | POA: Diagnosis not present

## 2020-12-23 DIAGNOSIS — Z90722 Acquired absence of ovaries, bilateral: Secondary | ICD-10-CM

## 2020-12-23 DIAGNOSIS — Z9079 Acquired absence of other genital organ(s): Secondary | ICD-10-CM | POA: Diagnosis not present

## 2020-12-23 NOTE — Patient Instructions (Signed)

## 2020-12-23 NOTE — Progress Notes (Signed)
   Cynthia Berger 1956/11/11 [id number]   History:  65 y.o. [id number] presents for annual exam without GYN complaints. 2014 TAH BSO for persistent LGSIL/CIN-1/HR HPV, endometriosis and leiomyoma. No HRT. Normal mammogram history. 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   Gynecologic History Patient's last menstrual period was 08/23/2006.   Contraception: status post hysterectomy Last Pap: 12/2019. Results were: normal Last mammogram: 02/11/2020. Results were: normal Last colonoscopy: 12/25/2017. Results were: normal, 10 year recall Last Dexa: 01/09/2020. Results were: normal, 5-year recall  Past medical history, past surgical history, family history and social history were all reviewed and documented in the EPIC chart.  ROS:  A ROS was performed and pertinent positives and negatives are included.  Exam:  Vitals:   12/23/20 1405  BP: 122/83  Weight: 199 lb 9.6 oz (90.5 kg)  Height: 5\' 2"  (1.575 m)   Body mass index is 36.51 kg/m.  General appearance:  Normal Thyroid:  Symmetrical, normal in size, without palpable masses or nodularity. Respiratory  Auscultation:  Clear without wheezing or rhonchi Cardiovascular  Auscultation:  Regular rate, without rubs, murmurs or gallops  Edema/varicosities:  Not grossly evident Abdominal  Soft,nontender, without masses, guarding or rebound.  Liver/spleen:  No organomegaly noted  Hernia:  None appreciated  Skin  Inspection:  Grossly normal   Breasts: Examined lying and sitting.   Right: Without masses, retractions, discharge or axillary adenopathy.   Left: Without masses, retractions, discharge or axillary adenopathy. Gentitourinary   Inguinal/mons:  Normal without inguinal adenopathy  External genitalia:  Normal  BUS/Urethra/Skene's glands:  Normal  Vagina:  Normal  Cervix:  Absent  Uterus:  Absent  Adnexa/parametria:     Rt: Without masses or tenderness.   Lt: Without masses or tenderness.  Anus and perineum: Normal  Digital rectal  exam: Normal sphincter tone without palpated masses or tenderness  Assessment/Plan:  65 y.o. 65 for annual exam.   Well woman exam with routine gynecological exam - Education provided on SBEs, importance of preventative screenings, current guidelines, high calcium diet, regular exercise, and multivitamin daily. Labs with PCP.   History of total abdominal hysterectomy and bilateral salpingo-oophorectomy - 2014 for endometriosis, leiomyoma, and persistent LGSIL. No HRT.   Screening for cervical cancer - History of persistent LGSIL/CIN-1. Normal paps since hysterectomy. Discussed current recommendations to stop screening. She would like one more at 3-year interval and then stop.   Screening for breast cancer - Normal mammogram history.  Continue annual screenings.  Normal breast exam today.  Screening for colon cancer - 2019 colonoscopy. Will repeat at GI's recommended interval.   Follow up in 1 year for annual.     2020 [facility], 2:11 PM 12/23/2020

## 2021-01-07 ENCOUNTER — Other Ambulatory Visit: Payer: Self-pay

## 2021-01-11 ENCOUNTER — Other Ambulatory Visit: Payer: Self-pay

## 2021-01-11 ENCOUNTER — Ambulatory Visit (INDEPENDENT_AMBULATORY_CARE_PROVIDER_SITE_OTHER): Payer: BC Managed Care – PPO | Admitting: Endocrinology

## 2021-01-11 VITALS — BP 150/84 | HR 64 | Ht 63.0 in | Wt 204.6 lb

## 2021-01-11 DIAGNOSIS — E042 Nontoxic multinodular goiter: Secondary | ICD-10-CM

## 2021-01-11 LAB — TSH: TSH: 0.15 u[IU]/mL — ABNORMAL LOW (ref 0.35–4.50)

## 2021-01-11 LAB — T4, FREE: Free T4: 0.87 ng/dL (ref 0.60–1.60)

## 2021-01-11 MED ORDER — METHIMAZOLE 5 MG PO TABS: 2.5000 mg | ORAL_TABLET | Freq: Every day | ORAL | 3 refills | Status: DC

## 2021-01-11 NOTE — Patient Instructions (Addendum)
Your blood pressure is high today.  Please see your primary care provider soon, to have it rechecked Blood tests are requested for you today.  We'll let you know about the results.  most of the time, a "lumpy thyroid" will eventually become overactive.  this is usually a slow process, happening over the span of many years.   Please come back for a follow-up appointment in 1 year.

## 2021-01-11 NOTE — Progress Notes (Signed)
Subjective:    Patient ID: Cynthia Berger, female    DOB: 1956-12-06, 65 y.o.   MRN: [id number]  HPI Pt returns for f/u of small multinodular goiter (dx'ed 2013; bx of [place] nodule #2) was low-risk; she has been euthyroid off rx; f/u US in 2021 was unchanged, and did not meet criteria for bx; TSH is low-normal).  She does not notice any change in the size of the goiter.  pt states she feels well in general.   Past Medical History:  Diagnosis Date  . AC (acromioclavicular) joint bone spurs    HEEL BONE SPURS-SURGERY 12/2010 LEFT FOOT  . Antral gastritis   . Cancer ([place])   . 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 DDD (degenerative disc disease), cervical   . Esophageal dysmotility   . GERD (gastroesophageal reflux disease)   . Heart palpitations   . Hiatal hernia   . Hypertension   . LGSIL (low grade squamous intraepithelial dysplasia) 2013    C&B Inadequate ECC--positive LGSIL --  . Multinodular goiter    RIGHT LOBE-- PER BX NEGATIVE  . Positive H. pylori test   . Vitamin D deficiency 11/2007   LOW AT 18    Past Surgical History:  Procedure Laterality Date  . ABDOMINAL HYSTERECTOMY N/A 05/28/2013   Procedure: HYSTERECTOMY ABDOMINAL;  Surgeon: Anastasio Auerbach, MD;  Location: [facility];  Service: Gynecology;  Laterality: N/A;  . BIOPSY THYROID Right 2014   ENLARGED THYROID NO MEDS BENIGN  . CERVICAL BIOPSY  W/ LOOP ELECTRODE EXCISION  10/2012   for persistent low-grade dysplasia  . CESAREAN SECTION  1993   W/ TUBAL LIGATION, BILATERAL  . CYSTOSCOPY N/A 05/28/2013   Procedure: CYSTOSCOPY;  Surgeon: Anastasio Auerbach, MD;  Location: [facility];  Service: Gynecology;  Laterality: N/A;  . esophogeal dilatation    . FOOT SURGERY  2012   left heel spur removed  . HYSTEROSCOPY WITH D & C  06/08/2012   Procedure: DILATATION AND CURETTAGE /HYSTEROSCOPY;  Surgeon: Anastasio Auerbach, MD;  Location: [facility];  Service: Gynecology;  Laterality: N/A;   WITH RESECTION OF MYOMA  . NEPHRECTOMY  2005   RIGHT KIDNEY CANCER  . ORIF TOE FRACTURE Right 08/15/2018   Procedure: RIGHT FIFTH TOE OSTEOTOMY AND PINNING;  Surgeon: Marybelle Killings, MD;  Location: [facility];  Service: Orthopedics;  Laterality: Right;  . ROTATOR CUFF REPAIR  2010   RIGHT  . s/p Right nephrectomy Right   . SALPINGOOPHORECTOMY Bilateral 05/28/2013   Procedure: BILATERAL SALPINGO OOPHORECTOMY;  Surgeon: Anastasio Auerbach, MD;  Location: [facility];  Service: Gynecology;  Laterality: Bilateral;  . TOE SURGERY  2020   baby toe on right foot  . TRANSTHORACIC ECHOCARDIOGRAM  04-10-2012  DR BRIEN CRENSHAW   LVSF MILDLY REDUCED/ EF 45-50%  . TUBAL LIGATION      Social History   Socioeconomic History  . Marital status: Married    Spouse name: Not on file  . Number of children: 4  . Years of education: Not on file  . Highest education level: Not on file  Occupational History  . Occupation: [employer]    Employer: [employer]  Tobacco Use  . Smoking status: Never Smoker  . Smokeless tobacco: Never Used  Vaping Use  . Vaping Use: Never used  Substance and Sexual Activity  . Alcohol use: Not Currently    Alcohol/week: 0.0 standard  drinks    Comment: occasional wine  . Drug use: No  . Sexual activity: Yes    Partners: Male    Birth control/protection: Post-menopausal    Comment: INTERCOURSE AGE 46, SEXUAL PARTNERS LESS THAN 5  Other Topics Concern  . Not on file  Social History Narrative  . Not on file   Social Determinants of Health   Financial Resource Strain: Not on file  Food Insecurity: Not on file  Transportation Needs: Not on file  Physical Activity: Not on file  Stress: Not on file  Social Connections: Not on file  Intimate Partner Violence: Not on file    Current Outpatient Medications on File Prior to Visit  Medication Sig Dispense Refill  . amLODipine (NORVASC) 5 MG tablet TAKE 1 TABLET BY MOUTH EVERY DAY 90 tablet 3  .  cholecalciferol (VITAMIN D) 1000 UNITS tablet Take 1,000 Units by mouth daily.    . cyclobenzaprine (FLEXERIL) 10 MG tablet Take 1 tablet (10 mg total) by mouth 2 (two) times daily as needed for muscle spasms. 20 tablet 0  . diclofenac sodium (VOLTAREN) 1 % GEL Apply 4 g topically 4 (four) times daily. 4 Tube 3  . esomeprazole (NEXIUM) 20 MG packet Take 20 mg by mouth daily before breakfast.    . HYDROcodone-acetaminophen (NORCO/VICODIN) 5-325 MG tablet Take 1 tablet by mouth every 6 (six) hours as needed for severe pain. 10 tablet 0  . metoprolol succinate (TOPROL-XL) 25 MG 24 hr tablet TAKE 1 TABLET BY MOUTH EVERY DAY 90 tablet 2  . Multiple Vitamins-Minerals (MULTIVITAMIN PO) Take 1 tablet by mouth daily.     . naproxen (NAPROSYN) 375 MG tablet Take 1 tablet (375 mg total) by mouth 2 (two) times daily. 20 tablet 0  . OVER THE COUNTER MEDICATION Probiotic capsule. One capsule daily.    . [DISCONTINUED] dicyclomine (BENTYL) 20 MG tablet Take 1 tablet (20 mg total) by mouth 2 (two) times daily. 40 tablet 0   No current facility-administered medications on file prior to visit.    Allergies  Allergen Reactions  . Penicillins Rash    Family History  Problem Relation Age of Onset  . Hypertension Mother   . Hypertension Sister   . Hypertension Son   . Colon cancer Neg Hx   . Esophageal cancer Neg Hx   . Rectal cancer Neg Hx   . Stomach cancer Neg Hx   . Breast cancer Neg Hx     BP (!) 150/84 (BP Location: Right Arm, Patient Position: Sitting, Cuff Size: Large)   Pulse 64   Ht 5\' 3"  (1.6 m)   Wt 204 lb 9.6 oz (92.8 kg)   LMP 08/23/2006   SpO2 98%   BMI 36.24 kg/m    Review of Systems     Objective:   Physical Exam VITAL SIGNS:  See vs page GENERAL: no distress NECK: 4 cm right thyroid nodule is again noted   Lab Results  Component Value Date   TSH 0.15 (L) 01/11/2021      Assessment & Plan:  MNG: we discussed.  She declines to recheck the [place] this year.   Hyperthyroidism, new.  I have sent a prescription to your pharmacy, for tapazole.

## 2021-01-18 ENCOUNTER — Other Ambulatory Visit: Payer: Self-pay | Admitting: Family Medicine

## 2021-01-18 DIAGNOSIS — Z1231 Encounter for screening mammogram for malignant neoplasm of breast: Secondary | ICD-10-CM

## 2021-02-03 ENCOUNTER — Other Ambulatory Visit: Payer: Self-pay

## 2021-02-03 ENCOUNTER — Encounter: Payer: Self-pay | Admitting: Family Medicine

## 2021-02-03 ENCOUNTER — Ambulatory Visit (INDEPENDENT_AMBULATORY_CARE_PROVIDER_SITE_OTHER): Payer: BC Managed Care – PPO | Admitting: Family Medicine

## 2021-02-03 VITALS — BP 136/80 | HR 73 | Resp 16 | Ht 63.0 in | Wt 208.0 lb

## 2021-02-03 DIAGNOSIS — Z1329 Encounter for screening for other suspected endocrine disorder: Secondary | ICD-10-CM

## 2021-02-03 DIAGNOSIS — I10 Essential (primary) hypertension: Secondary | ICD-10-CM | POA: Diagnosis not present

## 2021-02-03 DIAGNOSIS — Z13228 Encounter for screening for other metabolic disorders: Secondary | ICD-10-CM

## 2021-02-03 DIAGNOSIS — G4730 Sleep apnea, unspecified: Secondary | ICD-10-CM

## 2021-02-03 DIAGNOSIS — N1831 Chronic kidney disease, stage 3a: Secondary | ICD-10-CM | POA: Diagnosis not present

## 2021-02-03 DIAGNOSIS — Z0000 Encounter for general adult medical examination without abnormal findings: Secondary | ICD-10-CM

## 2021-02-03 DIAGNOSIS — Z130 Encounter for screening for diseases of the blood and blood-forming organs and certain disorders involving the immune mechanism: Secondary | ICD-10-CM | POA: Diagnosis not present

## 2021-02-03 DIAGNOSIS — Z13220 Encounter for screening for lipoid disorders: Secondary | ICD-10-CM | POA: Diagnosis not present

## 2021-02-03 DIAGNOSIS — E559 Vitamin D deficiency, unspecified: Secondary | ICD-10-CM

## 2021-02-03 LAB — LIPID PANEL
Cholesterol: 198 mg/dL (ref 0–200)
HDL: 58.2 mg/dL (ref >39.00)
LDL Cholesterol: 119 mg/dL — ABNORMAL HIGH (ref 0–99)
NonHDL: 139.79
Total CHOL/HDL Ratio: 3
Triglycerides: 104 mg/dL (ref 0.0–149.0)
VLDL: 20.8 mg/dL (ref 0.0–40.0)

## 2021-02-03 LAB — VITAMIN D 25 HYDROXY (VIT D DEFICIENCY, FRACTURES): VITD: 36.6 ng/mL (ref 30.00–100.00)

## 2021-02-03 LAB — HEMOGLOBIN A1C: Hgb A1c MFr Bld: 5.5 % (ref 4.6–6.5)

## 2021-02-03 NOTE — Assessment & Plan Note (Signed)
Having BMP at her urologist's office q 3-6 months. Low salt diet and adequate hydration. Avoid NSAID's.

## 2021-02-03 NOTE — Assessment & Plan Note (Signed)
BP adequately controlled. Continue Metoprolol and Amlodipine. Low salt diet. He is seeing urologist q 3 months, so I will see her back in a year.

## 2021-02-03 NOTE — Assessment & Plan Note (Signed)
Continue current vit D dose on multivit. Further recommendations according to 25 [place] vit D result.

## 2021-02-03 NOTE — Progress Notes (Signed)
HPI: [name] is a 65 y.o. female, who is here today for her routine physical.  Last CPE: 02/03/2020.  Regular exercise 3 or more time per week: For about a year she has been walking and doing jumping exercises 3 times per week. Following a healthy diet: She is trying to count calories. She lives with her husband.  Chronic medical problems: Vit D def,HTN,OSA,multinodular goiter,hx of renal cancer s/p nephrectomy. She follows with endocrinologist regularly.  Pap smear: S/P hysterectomy. She is established with gyn and follows regularly.  Immunization History  Administered Date(s) Administered  . Influenza,inj,Quad PF,6+ Mos 10/24/2017, 01/03/2019, 02/03/2020  . Influenza-Unspecified 09/10/2013  . PFIZER(Purple Top)SARS-COV-2 Vaccination 10/09/2020  . Pneumococcal Polysaccharide-23 10/24/2017  . Tdap 06/09/2014   Mammogram: 02/11/20 Colonoscopy: 12/25/17. DEXA: 01/09/20.  Hep C screening: 10/24/17 NR.  Concerns today: Difficulty losing wt. Breakfast: Honey cheerios with whole milk. Beef sandwich for lunch. For dinners: Vegetables, bake chicken. She also eats fried fish sometimes. Cooks usually. Fruit and yogurt for snacks.  Vit D deficiency: She takes a daily multivitamin, which has vit D.  HTN:She is on Metoprolol succinate 25 mg daily and Amlodipine 5 mg daily.  CKD III: She has not noted gross hematuria, foam in urine,or decreased urine output. Follows with urologist q 3 months and reporting having BMP regularly. Multinodular goiter: Follows with Dr Loanne Drilling.  OSA: She is not on CPAP. States that her husband has witnessed episodes of apnea. Negative for fatigue or morning headaches.  Review of Systems  Constitutional: Negative for appetite change, chills and fever.  HENT: Negative for dental problem, hearing loss, mouth sores, sore throat, trouble swallowing and voice change.   Eyes: Negative for redness and visual disturbance.  Respiratory: Negative for  cough, shortness of breath and wheezing.   Cardiovascular: Negative for chest pain and leg swelling.  Gastrointestinal: Negative for abdominal pain, nausea and vomiting.       No changes in bowel habits.  Endocrine: Negative for cold intolerance, heat intolerance, polydipsia, polyphagia and polyuria.  Genitourinary: Negative for dysuria, vaginal bleeding and vaginal discharge.  Musculoskeletal: Negative for arthralgias, gait problem and myalgias.  Skin: Negative for color change and rash.  Allergic/Immunologic: Negative for environmental allergies.  Neurological: Negative for syncope, facial asymmetry and weakness.  Hematological: Negative for adenopathy. Does not bruise/bleed easily.  Psychiatric/Behavioral: Negative for confusion and sleep disturbance. The patient is not nervous/anxious.   All other systems reviewed and are negative.  Current Outpatient Medications on File Prior to Visit  Medication Sig Dispense Refill  . amLODipine (NORVASC) 5 MG tablet TAKE 1 TABLET BY MOUTH EVERY DAY 90 tablet 3  . cholecalciferol (VITAMIN D) 1000 UNITS tablet Take 1,000 Units by mouth daily.    . cyclobenzaprine (FLEXERIL) 10 MG tablet Take 1 tablet (10 mg total) by mouth 2 (two) times daily as needed for muscle spasms. 20 tablet 0  . diclofenac sodium (VOLTAREN) 1 % GEL Apply 4 g topically 4 (four) times daily. 4 Tube 3  . esomeprazole (NEXIUM) 20 MG packet Take 20 mg by mouth daily before breakfast.    . HYDROcodone-acetaminophen (NORCO/VICODIN) 5-325 MG tablet Take 1 tablet by mouth every 6 (six) hours as needed for severe pain. 10 tablet 0  . methimazole (TAPAZOLE) 5 MG tablet Take 0.5 tablets (2.5 mg total) by mouth daily. 45 tablet 3  . metoprolol succinate (TOPROL-XL) 25 MG 24 hr tablet TAKE 1 TABLET BY MOUTH EVERY DAY 90 tablet 2  . Multiple Vitamins-Minerals (MULTIVITAMIN PO)  Take 1 tablet by mouth daily.     Marland Kitchen OVER THE COUNTER MEDICATION Probiotic capsule. One capsule daily.    .  [DISCONTINUED] dicyclomine (BENTYL) 20 MG tablet Take 1 tablet (20 mg total) by mouth 2 (two) times daily. 40 tablet 0   No current facility-administered medications on file prior to visit.   Past Medical History:  Diagnosis Date  . AC (acromioclavicular) joint bone spurs    HEEL BONE SPURS-SURGERY 12/2010 LEFT FOOT  . Antral gastritis   . Cancer ([place])   . 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 DDD (degenerative disc disease), cervical   . Esophageal dysmotility   . GERD (gastroesophageal reflux disease)   . Heart palpitations   . Hiatal hernia   . Hypertension   . LGSIL (low grade squamous intraepithelial dysplasia) 2013    C&B Inadequate ECC--positive LGSIL --  . Multinodular goiter    RIGHT LOBE-- PER BX NEGATIVE  . Positive H. pylori test   . Vitamin D deficiency 11/2007   LOW AT 18    Past Surgical History:  Procedure Laterality Date  . ABDOMINAL HYSTERECTOMY N/A 05/28/2013   Procedure: HYSTERECTOMY ABDOMINAL;  Surgeon: Anastasio Auerbach, MD;  Location: [facility];  Service: Gynecology;  Laterality: N/A;  . BIOPSY THYROID Right 2014   ENLARGED THYROID NO MEDS BENIGN  . CERVICAL BIOPSY  W/ LOOP ELECTRODE EXCISION  10/2012   for persistent low-grade dysplasia  . CESAREAN SECTION  1993   W/ TUBAL LIGATION, BILATERAL  . CYSTOSCOPY N/A 05/28/2013   Procedure: CYSTOSCOPY;  Surgeon: Anastasio Auerbach, MD;  Location: [facility];  Service: Gynecology;  Laterality: N/A;  . esophogeal dilatation    . FOOT SURGERY  2012   left heel spur removed  . HYSTEROSCOPY WITH D & C  06/08/2012   Procedure: DILATATION AND CURETTAGE /HYSTEROSCOPY;  Surgeon: Anastasio Auerbach, MD;  Location: [facility];  Service: Gynecology;  Laterality: N/A;  WITH RESECTION OF MYOMA  . NEPHRECTOMY  2005   RIGHT KIDNEY CANCER  . ORIF TOE FRACTURE Right 08/15/2018   Procedure: RIGHT FIFTH TOE OSTEOTOMY AND PINNING;  Surgeon: Marybelle Killings, MD;  Location: [facility];  Service: Orthopedics;  Laterality: Right;  . ROTATOR CUFF REPAIR  2010   RIGHT  . s/p Right nephrectomy Right   . SALPINGOOPHORECTOMY Bilateral 05/28/2013   Procedure: BILATERAL SALPINGO OOPHORECTOMY;  Surgeon: Anastasio Auerbach, MD;  Location: [facility];  Service: Gynecology;  Laterality: Bilateral;  . TOE SURGERY  2020   baby toe on right foot  . TRANSTHORACIC ECHOCARDIOGRAM  04-10-2012  DR BRIEN CRENSHAW   LVSF MILDLY REDUCED/ EF 45-50%  . TUBAL LIGATION      Allergies  Allergen Reactions  . Penicillins Rash   Family History  Problem Relation Age of Onset  . Hypertension Mother   . Hypertension Sister   . Hypertension Son   . Colon cancer Neg Hx   . Esophageal cancer Neg Hx   . Rectal cancer Neg Hx   . Stomach cancer Neg Hx   . Breast cancer Neg Hx     Social History   Socioeconomic History  . Marital status: Married    Spouse name: Not on file  . Number of children: 4  . Years of education: Not on file  . Highest education level: Not on file  Occupational History  . Occupation: Building control surveyor:  [employer]  Tobacco Use  . Smoking status: Never Smoker  . Smokeless tobacco: Never Used  Vaping Use  . Vaping Use: Never used  Substance and Sexual Activity  . Alcohol use: Not Currently    Alcohol/week: 0.0 standard drinks    Comment: occasional wine  . Drug use: No  . Sexual activity: Yes    Partners: Male    Birth control/protection: Post-menopausal    Comment: INTERCOURSE AGE 55, SEXUAL PARTNERS LESS THAN 5  Other Topics Concern  . Not on file  Social History Narrative  . Not on file   Social Determinants of Health   Financial Resource Strain: Not on file  Food Insecurity: Not on file  Transportation Needs: Not on file  Physical Activity: Not on file  Stress: Not on file  Social Connections: Not on file   Vitals:   02/03/21 0912  BP: 136/80  Pulse: 73  Resp: 16  SpO2: 100%   Wt Readings from Last 3 Encounters:  02/03/21  208 lb (94.3 kg)  01/11/21 204 lb 9.6 oz (92.8 kg)  12/23/20 199 lb 9.6 oz (90.5 kg)   Body mass index is 36.85 kg/m.  Wt Readings from Last 3 Encounters:  02/03/21 208 lb (94.3 kg)  01/11/21 204 lb 9.6 oz (92.8 kg)  12/23/20 199 lb 9.6 oz (90.5 kg)    Physical Exam Vitals and nursing note reviewed.  Constitutional:      General: She is not in acute distress.    Appearance: She is well-developed.  HENT:     Head: Normocephalic and atraumatic.     Right Ear: Hearing, tympanic membrane, ear canal and external ear normal.     Left Ear: Hearing, tympanic membrane, ear canal and external ear normal.     Mouth/Throat:     Mouth: Oropharynx is clear and moist and mucous membranes are normal. Mucous membranes are moist.     Pharynx: Oropharynx is clear. Uvula midline.  Eyes:     Extraocular Movements: Extraocular movements intact and EOM normal.     Conjunctiva/sclera: Conjunctivae normal.     Pupils: Pupils are equal, round, and reactive to light.  Neck:     Thyroid: No thyromegaly.     Trachea: No tracheal deviation.  Cardiovascular:     Rate and Rhythm: Normal rate and regular rhythm.     Pulses:          Dorsalis pedis pulses are 2+ on the right side and 2+ on the left side.     Heart sounds: No murmur heard.   Pulmonary:     Effort: Pulmonary effort is normal. No respiratory distress.     Breath sounds: Normal breath sounds.  Chest:  Breasts:     Right: No supraclavicular adenopathy.     Left: No supraclavicular adenopathy.    Abdominal:     Palpations: Abdomen is soft. There is no hepatomegaly or mass.     Tenderness: There is no abdominal tenderness.  Genitourinary:    Comments: Deferred to gyn. Musculoskeletal:        General: No edema.     Comments: No signs of synovitis appreciated.  Lymphadenopathy:     Cervical: No cervical adenopathy.     Upper Body:     Right upper body: No supraclavicular adenopathy.     Left upper body: No supraclavicular  adenopathy.  Skin:    General: Skin is warm.     Findings: No erythema or rash.  Neurological:  General: No focal deficit present.     Mental Status: She is alert and oriented to person, place, and time.     Cranial Nerves: No cranial nerve deficit.     Coordination: Coordination normal.     Gait: Gait normal.     Deep Tendon Reflexes: Strength normal.     Reflex Scores:      Bicep reflexes are 2+ on the right side and 2+ on the left side.      Patellar reflexes are 2+ on the right side and 2+ on the left side. Psychiatric:        Mood and Affect: Mood and affect normal.        Speech: Speech normal.     Comments: Well groomed, good eye contact.   ASSESSMENT AND PLAN:  Cynthia Berger was here today annual physical examination.  Orders Placed This Encounter  Procedures  . VITAMIN D 25 Hydroxy (Vit-D Deficiency, Fractures)  . Lipid panel  . Hemoglobin A1c  . Ambulatory referral to Sleep Studies   Lab Results  Component Value Date   HGBA1C 5.5 02/03/2021   Lab Results  Component Value Date   CHOL 198 02/03/2021   HDL 58.20 02/03/2021   LDLCALC 119 (H) 02/03/2021   TRIG 104.0 02/03/2021   CHOLHDL 3 02/03/2021   Routine general medical examination at a health care facility She understands the importance of regular physical activity and healthy diet for prevention of chronic illness and/or complications. Preventive guidelines reviewed. Vaccination up to date. Continue her female preventive care with her gyn. Ca++ and vit D supplementation recommended. Next CPE in a year.  The 10-year ASCVD risk score Mikey Bussing DC Brooke Bonito., et al., 2013) is: 9.2%   Values used to calculate the score:     Age: 40 years     Sex: Female     Is Non-Hispanic African American: Yes     Diabetic: No     Tobacco smoker: No     Systolic Blood Pressure: 970 mmHg     Is BP treated: Yes     HDL Cholesterol: 58.2 mg/dL     Total Cholesterol: 198 mg/dL  Sleep apnea, unspecified type We discussed  adverse effects of not treated OSA. Sleep study will be arranged. Wt loss will also help.  Screening for lipoid disorders -     Lipid panel  Screening for endocrine, metabolic and immunity disorder -     Hemoglobin A1c  Vitamin D deficiency, unspecified Continue current vit D dose on multivit. Further recommendations according to 25 OH vit D result.  Benign essential hypertension BP adequately controlled. Continue Metoprolol and Amlodipine. Low salt diet. He is seeing urologist q 3 months, so I will see her back in a year.  CKD (chronic kidney disease), stage III ([place]) Having BMP at her urologist's office q 3-6 months. Low salt diet and adequate hydration. Avoid NSAID's.   Return in 1 year (on 02/03/2022) for cpe.   Jovon Winterhalter G. Martinique, MD  [facility]. [place] office.   Today you have you routine preventive visit. A few things to remember from today's visit:   Vitamin D deficiency, unspecified - Plan: VITAMIN D 25 Hydroxy (Vit-D Deficiency, Fractures)  Sleep apnea, unspecified type - Plan: Ambulatory referral to Sleep Studies  Routine general medical examination at a health care facility  Screening for lipoid disorders - Plan: Lipid panel  Screening for endocrine, metabolic and immunity disorder - Plan: Hemoglobin A1c  Please be sure medication  list is accurate. If a new problem present, please set up appointment sooner than planned today.  At least 150 minutes of moderate exercise per week, daily brisk walking for 15-30 min is a good exercise option. Healthy diet low in saturated (animal) fats and sweets and consisting of fresh fruits and vegetables, lean meats such as fish and white chicken and whole grains.  These are some of recommendations for screening depending of age and risk factors:  - Vaccines:  Tdap vaccine every 10 years.  Shingles vaccine recommended at age 93, could be given after 64 years of age but not sure about insurance coverage.    Pneumonia vaccines: Pneumovax at 82. Sometimes Pneumovax is giving earlier if history of smoking, lung disease,diabetes,kidney disease among some.  Screening for diabetes at age 81 and every 3 years.  Cervical cancer prevention:  Pap smear starts at 64 years of age and continues periodically until 64 years old in low risk women. Pap smear every 3 years between 76 and 33 years old. Pap smear every 3-5 years between women 65 and older if pap smear negative and HPV screening negative.  -Breast cancer: Mammogram: There is disagreement between experts about when to start screening in low risk asymptomatic female but recent recommendations are to start screening at 41 and not later than 64 years old , every 1-2 years and after 64 yo q 2 years. Screening is recommended until 64 years old but some women can continue screening depending of healthy issues.  Colon cancer screening: Has been recently changed to 64 yo. Insurance may not cover until you are 64 years old. Screening is recommended until 64 years old.  Cholesterol disorder screening at age 67 and every 3 years.  Also recommended:  1. Dental visit- Brush and floss your teeth twice daily; visit your dentist twice a year. 2. Eye doctor- Get an eye exam at least every 2 years. 3. Helmet use- Always wear a helmet when riding a bicycle, motorcycle, rollerblading or skateboarding. 4. Safe sex- If you may be exposed to sexually transmitted infections, use a condom. 5. Seat belts- Seat belts can save your live; always wear one. 6. Smoke/Carbon Monoxide detectors- These detectors need to be installed on the appropriate level of your home. Replace batteries at least once a year. 7. Skin cancer- When out in the sun please cover up and use sunscreen 15 SPF or higher. 8. Violence- If anyone is threatening or hurting you, please tell your healthcare provider.  9. Drink alcohol in moderation- Limit alcohol intake to one drink or less per day. Never  drink and drive. 10. Calcium supplementation 1000 to 1200 mg daily, ideally through your diet.  Vitamin D supplementation 800 units daily.

## 2021-02-03 NOTE — Patient Instructions (Addendum)
Today you have you routine preventive visit. A few things to remember from today's visit:   Vitamin D deficiency, unspecified - Plan: VITAMIN D 25 Hydroxy (Vit-D Deficiency, Fractures)  Sleep apnea, unspecified type - Plan: Ambulatory referral to Sleep Studies  Routine general medical examination at a health care facility  Screening for lipoid disorders - Plan: Lipid panel  Screening for endocrine, metabolic and immunity disorder - Plan: Hemoglobin A1c  Please be sure medication list is accurate. If a new problem present, please set up appointment sooner than planned today.  At least 150 minutes of moderate exercise per week, daily brisk walking for 15-30 min is a good exercise option. Healthy diet low in saturated (animal) fats and sweets and consisting of fresh fruits and vegetables, lean meats such as fish and white chicken and whole grains.  These are some of recommendations for screening depending of age and risk factors:  - Vaccines:  Tdap vaccine every 10 years.  Shingles vaccine recommended at age 81, could be given after 64 years of age but not sure about insurance coverage.   Pneumonia vaccines: Pneumovax at 47. Sometimes Pneumovax is giving earlier if history of smoking, lung disease,diabetes,kidney disease among some.  Screening for diabetes at age 46 and every 3 years.  Cervical cancer prevention:  Pap smear starts at 64 years of age and continues periodically until 64 years old in low risk women. Pap smear every 3 years between 69 and 43 years old. Pap smear every 3-5 years between women 90 and older if pap smear negative and HPV screening negative.  -Breast cancer: Mammogram: There is disagreement between experts about when to start screening in low risk asymptomatic female but recent recommendations are to start screening at 38 and not later than 64 years old , every 1-2 years and after 65 yo q 2 years. Screening is recommended until 64 years old but some women  can continue screening depending of healthy issues.  Colon cancer screening: Has been recently changed to 64 yo. Insurance may not cover until you are 64 years old. Screening is recommended until 64 years old.  Cholesterol disorder screening at age 44 and every 3 years.  Also recommended:  1. Dental visit- Brush and floss your teeth twice daily; visit your dentist twice a year. 2. Eye doctor- Get an eye exam at least every 2 years. 3. Helmet use- Always wear a helmet when riding a bicycle, motorcycle, rollerblading or skateboarding. 4. Safe sex- If you may be exposed to sexually transmitted infections, use a condom. 5. Seat belts- Seat belts can save your live; always wear one. 6. Smoke/Carbon Monoxide detectors- These detectors need to be installed on the appropriate level of your home. Replace batteries at least once a year. 7. Skin cancer- When out in the sun please cover up and use sunscreen 15 SPF or higher. 8. Violence- If anyone is threatening or hurting you, please tell your healthcare provider.  9. Drink alcohol in moderation- Limit alcohol intake to one drink or less per day. Never drink and drive. 10. Calcium supplementation 1000 to 1200 mg daily, ideally through your diet.  Vitamin D supplementation 800 units daily.

## 2021-02-10 ENCOUNTER — Telehealth: Payer: Self-pay | Admitting: Endocrinology

## 2021-02-10 DIAGNOSIS — E042 Nontoxic multinodular goiter: Secondary | ICD-10-CM

## 2021-02-10 NOTE — Telephone Encounter (Signed)
Patient called to ask about kidney function labs. 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    Please put labs and cal patient to advise at [phone]

## 2021-02-11 NOTE — Telephone Encounter (Signed)
Not needed for this medication, but no problem.  I have ordered

## 2021-02-11 NOTE — Telephone Encounter (Signed)
Spoke with pt and advised her that Loanne Drilling has placed lads for her to have done in 2 weeks of her taking the medication Advise her to contact the office to schedule a nurse visit for labs.

## 2021-02-11 NOTE — Telephone Encounter (Signed)
Advise

## 2021-02-26 ENCOUNTER — Other Ambulatory Visit: Payer: Self-pay

## 2021-02-26 ENCOUNTER — Encounter: Payer: Self-pay | Admitting: Gastroenterology

## 2021-02-26 ENCOUNTER — Ambulatory Visit (INDEPENDENT_AMBULATORY_CARE_PROVIDER_SITE_OTHER): Payer: 59 | Admitting: Gastroenterology

## 2021-02-26 VITALS — BP 148/90 | HR 72 | Ht 62.0 in | Wt 202.2 lb

## 2021-02-26 DIAGNOSIS — K219 Gastro-esophageal reflux disease without esophagitis: Secondary | ICD-10-CM | POA: Diagnosis not present

## 2021-02-26 DIAGNOSIS — Z79899 Other long term (current) drug therapy: Secondary | ICD-10-CM

## 2021-02-26 MED ORDER — FAMOTIDINE 20 MG PO TABS: 20.0000 mg | ORAL_TABLET | Freq: Two times a day (BID) | ORAL | 3 refills | Status: DC | PRN

## 2021-02-26 NOTE — Progress Notes (Signed)
HPI :  65 year old female here to reestablish care for GERD.  She was last seen here over 3 years ago, colonoscopy in January 2019.  She has a history of GERD, history of renal cancer status post nephrectomy, hypertension.  She states she has had reflux symptoms for a long time, several years.  Her symptoms include heartburn, globus, and burning in her epigastric area.  On review of her chart it appears she had a normal manometry and 24-hour pH test in 2005 with Dr. Collene Mares.  She states she has responded to PPI in the past however which can help her symptoms.  Currently she is taking Nexium 20 mg roughly once per week, as she wants to avoid medications for reflux in light of her history of renal cancer with one kidney.  Her baseline creatinine is at 1.2, she follows with nephrology.  She states the Nexium has not been helping recently and her symptoms over the past week have bothered her significantly.  She has a lot of burning epigastric discomfort, globus which she states she can get with her reflux.  In recent days she is she has started using Nexium daily.  She is not having much pyrosis right now.  She generally does not have any significant dysphagia on a day-to-day basis.  She has specific foods that are hard for her to swallow such as jellybeans and gummy bears but otherwise tolerates her meals fine without any dysphagia.  Her last EGD was in 2013 which was normal, she had empiric dilation at that time for dysphagia which she states definitely helped at the time.  We discussed her history of reflux and options.  She has not tried Pepcid or Zantac in many years.  She has been on Dexilant in the past and states that works really well for her, she ran out a while ago and has been using Nexium over-the-counter in its place.  She otherwise denies any nausea or vomiting, no abdominal pains.  No changes in her bowel habits.  No blood in her stools.   Colonoscopy 01/04/18 - The perianal and digital rectal  examinations were normal. - The colon was tortuous. - A single small angiodysplastic lesion was found in the ascending colon. - The exam was otherwise without abnormality on direct and retroflexion views. No polyps.  EGD 11/13/2012 - normal exam, empiric dilation performed    Past Medical History:  Diagnosis Date  . AC (acromioclavicular) joint bone spurs    HEEL BONE SPURS-SURGERY 12/2010 LEFT FOOT  . Antral gastritis   . Cancer of kidney ([place])   . 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 DDD (degenerative disc disease), cervical   . Esophageal dysmotility   . GERD (gastroesophageal reflux disease)   . Heart palpitations   . Hiatal hernia   . Hypertension   . Hyperthyroidism   . LGSIL (low grade squamous intraepithelial dysplasia) 2013    C&B Inadequate ECC--positive LGSIL --  . Multinodular goiter    RIGHT LOBE-- PER BX NEGATIVE  . Positive H. pylori test   . Thyroid nodule   . Vitamin D deficiency 11/2007   LOW AT 18     Past Surgical History:  Procedure Laterality Date  . ABDOMINAL HYSTERECTOMY N/A 05/28/2013   Procedure: HYSTERECTOMY ABDOMINAL;  Surgeon: Anastasio Auerbach, MD;  Location: [facility];  Service: Gynecology;  Laterality: N/A;  . BIOPSY THYROID Right 2014   ENLARGED THYROID NO MEDS BENIGN  .  CERVICAL BIOPSY  W/ LOOP ELECTRODE EXCISION  10/2012   for persistent low-grade dysplasia  . CESAREAN SECTION  1993   W/ TUBAL LIGATION, BILATERAL  . CYSTOSCOPY N/A 05/28/2013   Procedure: CYSTOSCOPY;  Surgeon: Anastasio Auerbach, MD;  Location: [facility];  Service: Gynecology;  Laterality: N/A;  . esophogeal dilatation    . FOOT SURGERY  2012   left heel spur removed  . HYSTEROSCOPY WITH D & C  06/08/2012   Procedure: DILATATION AND CURETTAGE /HYSTEROSCOPY;  Surgeon: Anastasio Auerbach, MD;  Location: [place];  Service: Gynecology;  Laterality: N/A;  WITH RESECTION OF MYOMA  . NEPHRECTOMY  2005   RIGHT KIDNEY CANCER  .  ORIF TOE FRACTURE Right 08/15/2018   Procedure: RIGHT FIFTH TOE OSTEOTOMY AND PINNING;  Surgeon: Marybelle Killings, MD;  Location: [place];  Service: Orthopedics;  Laterality: Right;  . ROTATOR CUFF REPAIR  2010   RIGHT  . s/p Right nephrectomy Right   . SALPINGOOPHORECTOMY Bilateral 05/28/2013   Procedure: BILATERAL SALPINGO OOPHORECTOMY;  Surgeon: Anastasio Auerbach, MD;  Location: [facility];  Service: Gynecology;  Laterality: Bilateral;  . TOE SURGERY  2020   baby toe on right foot  . TRANSTHORACIC ECHOCARDIOGRAM  04-10-2012  DR BRIEN CRENSHAW   LVSF MILDLY REDUCED/ EF 45-50%  . TUBAL LIGATION     Family History  Problem Relation Age of Onset  . Hypertension Mother   . Hypertension Sister   . Hypertension Son   . Colon cancer Neg Hx   . Esophageal cancer Neg Hx   . Rectal cancer Neg Hx   . Stomach cancer Neg Hx   . Breast cancer Neg Hx    Social History   Tobacco Use  . Smoking status: Never Smoker  . Smokeless tobacco: Never Used  Vaping Use  . Vaping Use: Never used  Substance Use Topics  . Alcohol use: Not Currently    Alcohol/week: 0.0 standard drinks    Comment: occasional wine  . Drug use: No   Current Outpatient Medications  Medication Sig Dispense Refill  . amLODipine (NORVASC) 5 MG tablet TAKE 1 TABLET BY MOUTH EVERY DAY 90 tablet 3  . cholecalciferol (VITAMIN D) 1000 UNITS tablet Take 1,000 Units by mouth daily.    . cyclobenzaprine (FLEXERIL) 10 MG tablet Take 1 tablet (10 mg total) by mouth 2 (two) times daily as needed for muscle spasms. (Patient taking differently: Take 10 mg by mouth as needed for muscle spasms.) 20 tablet 0  . diclofenac sodium (VOLTAREN) 1 % GEL Apply 4 g topically 4 (four) times daily. 4 Tube 3  . esomeprazole (NEXIUM) 20 MG packet Take 20 mg by mouth daily before breakfast.    . methimazole (TAPAZOLE) 5 MG tablet Take 0.5 tablets (2.5 mg total) by mouth daily. 45 tablet 3  . metoprolol succinate (TOPROL-XL) 25 MG 24 hr  tablet TAKE 1 TABLET BY MOUTH EVERY DAY 90 tablet 2  . Multiple Vitamins-Minerals (MULTIVITAMIN PO) Take 1 tablet by mouth daily.     Marland Kitchen OVER THE COUNTER MEDICATION Probiotic capsule. One capsule daily.     No current facility-administered medications for this visit.   Allergies  Allergen Reactions  . Penicillins Rash     Review of Systems: All systems reviewed and negative except where noted in HPI.   Lab Results  Component Value Date   CREATININE 1.21 (H) 01/30/2019   BUN 21 01/30/2019   NA 140 01/30/2019  K 4.9 01/30/2019   CL 105 01/30/2019   CO2 28 01/30/2019    Lab Results  Component Value Date   ALT 15 02/27/2015   AST 20 02/27/2015   ALKPHOS 85 02/27/2015   BILITOT 0.5 02/27/2015   Lab Results  Component Value Date   WBC 5.4 10/24/2017   HGB 13.1 10/24/2017   HCT 40.5 10/24/2017   MCV 90.6 10/24/2017   PLT 281.0 10/24/2017     Physical Exam: BP (!) 148/90 (BP Location: Left Arm, Patient Position: Sitting, Cuff Size: Normal)   Pulse 72   Ht 5\' 2"  (1.575 m) Comment: height measured witout shoes  Wt 202 lb 4 oz (91.7 kg)   LMP 08/23/2006   BMI 36.99 kg/m  Constitutional: Pleasant,well-developed, female in no acute distress. HEENT: Normocephalic and atraumatic. Conjunctivae are normal. No scleral icterus. Neck supple.  Cardiovascular: Normal rate, regular rhythm.  Pulmonary/chest: Effort normal, clear bilaterally Abdominal: Soft, nondistended, nontender.  There are no masses palpable.  Extremities: no edema Lymphadenopathy: No cervical adenopathy noted. Neurological: Alert and oriented to person place and time. Skin: Skin is warm and dry. No rashes noted. Psychiatric: Normal mood and affect. Behavior is normal.   ASSESSMENT AND PLAN: 65 year old female here to reestablish for the following:  GERD Long-term use of proton pump inhibitor History of nephrectomy  Discussed her history of reflux.  Typically uses PPI as needed and this typically  works okay for her however more recently has been having increased frequency of symptoms.  In light of her history of nephrectomy she is concerned about her risk for chronic kidney disease.  We discussed long-term use of chronic proton pump inhibitor therapy, risks including increased risk for chronic kidney disease, increased risk for fracture, increased risk for vitamin deficiencies, C. difficile etc.  I discussed options for treatment of reflux with her to include H2 blockers, PPIs, endoscopic therapy such as TIF, and surgery.  She has not had a trial of antihistamines in a while and if she needs to take something more frequently for her reflux, she may want to start with Pepcid given it does not have risk for chronic kidney disease.  After our discussion this is her preference, she will try Pepcid 20 mg twice daily for 2 weeks and then can use lowest dose needed to control her symptoms.  If Pepcid fails to control her symptoms, I can refill Dexilant for her to use as needed, she wants to avoid PPIs if possible.  If that is the case moving forward and she requires PPI to control symptoms, we could consider an EGD to assess her candidacy for possible TIF as an alternative way to manage her reflux and get her off PPI.  She wants to hold off on that for now await course on Pepcid, she will keep in touch with me and let me know if she wishes to have further evaluation based on her course.  She agreed with the plan, all questions answered  [place] Cellar, MD [facility]

## 2021-02-26 NOTE — Patient Instructions (Signed)
If you are age 64 or older, your body mass index should be between 23-30. Your Body mass index is 36.99 kg/m. If this is out of the aforementioned range listed, please consider follow up with your Primary Care Provider.  If you are age 42 or younger, your body mass index should be between 19-25. Your Body mass index is 36.99 kg/m. If this is out of the aformentioned range listed, please consider follow up with your Primary Care Provider.   We have sent the following medications to your pharmacy for you to pick up at your convenience: Pepcid 20 mg: Take twice a day as needed  Thank you for entrusting me with your care and for choosing [employer], Dr. Salina Cellar

## 2021-03-03 ENCOUNTER — Other Ambulatory Visit: Payer: Self-pay

## 2021-03-03 ENCOUNTER — Ambulatory Visit
Admission: RE | Admit: 2021-03-03 | Discharge: 2021-03-03 | Disposition: A | Discharge status: Home / Self Care | Payer: 59 | Source: Ambulatory Visit | Attending: Family Medicine | Admitting: Family Medicine

## 2021-03-03 DIAGNOSIS — Z1231 Encounter for screening mammogram for malignant neoplasm of breast: Secondary | ICD-10-CM

## 2021-03-08 ENCOUNTER — Telehealth: Payer: Self-pay | Admitting: Gastroenterology

## 2021-03-08 NOTE — Telephone Encounter (Signed)
Spoke with patient, she reports having diarrhea since last Tuesday or Wednesday. She states that she had a veggie smoothie for 2 days in a row and is not sure if that triggered her symptoms. She states that when she eats something heavy she will have diarrhea, yesterday she reports 8 episodes of diarrhea, describes as starting off large amounts then it was like water. She tried taking some pepto-bismol last night but was unsure if she should keep taking it. Advised that she could try some Imodium over the counter. Also reports that she saw BRB when wiping this morning. Advised that the frequent episodes of diarrhea have probably caused some irritation to the rectum. She states that she ate a banana and non-dairy yogurt this morning and feels ok right now but she is scared to eat anything else. Advised that she will need to maintain hydration with Gatorade, Pedialyte and water. She reports an intermittent burning sensation in her stomach, she states that she takes Nexium daily and will take the Pepcid as needed if she has increased reflux symptoms. Please advise, thanks

## 2021-03-08 NOTE — Telephone Encounter (Signed)
Spoke with patient, re-discussed recommendations from earlier. Advised patient that if loose stools are still persistent later this week or over the weekend she will need to let us know and we can order a stool study. Patient verbalized understanding and had no concerns at the end of the call.

## 2021-03-08 NOTE — Telephone Encounter (Signed)
Agree with your recommendations. If loose stools persist, then recommend GI pathogen panel. Thanks

## 2021-03-09 ENCOUNTER — Telehealth: Payer: Self-pay | Admitting: Gastroenterology

## 2021-03-09 NOTE — Telephone Encounter (Signed)
Spoke with patient's son, he reports that patient took Imodium last night as directed and loose stools have stopped. He states that she has a small bowel movement this morning, still taking Nexium and Pepcid as needed. He states that patient is reporting a sharp and throbbing epigastric pain this morning. Not having loose stools right now, advised that if she is not having loose stools she can hold the Imodium and take as needed. Advised that GERD may be causing the abdominal pain and she will need to continue medications as directed. She has been scheduled for a follow up with Tye Savoy, NP on Friday, 03/12/21 at 2:30 PM. Advised patient's son that if she develops severe abdominal pain or fevers she will need to go to the ED for evaluation. Son verbalized understanding and had no concerns at the end of the call.

## 2021-03-10 ENCOUNTER — Other Ambulatory Visit (HOSPITAL_COMMUNITY): Payer: Self-pay | Admitting: Emergency Medicine

## 2021-03-10 ENCOUNTER — Emergency Department (HOSPITAL_BASED_OUTPATIENT_CLINIC_OR_DEPARTMENT_OTHER)
Admission: EM | Admit: 2021-03-10 | Discharge: 2021-03-10 | Disposition: A | Discharge status: Home / Self Care | Payer: 59 | Attending: Emergency Medicine | Admitting: Emergency Medicine

## 2021-03-10 ENCOUNTER — Emergency Department (HOSPITAL_BASED_OUTPATIENT_CLINIC_OR_DEPARTMENT_OTHER): Payer: 59

## 2021-03-10 ENCOUNTER — Encounter (HOSPITAL_BASED_OUTPATIENT_CLINIC_OR_DEPARTMENT_OTHER): Payer: Self-pay | Admitting: Emergency Medicine

## 2021-03-10 ENCOUNTER — Other Ambulatory Visit: Payer: Self-pay

## 2021-03-10 DIAGNOSIS — Z79899 Other long term (current) drug therapy: Secondary | ICD-10-CM | POA: Diagnosis not present

## 2021-03-10 DIAGNOSIS — I129 Hypertensive chronic kidney disease with stage 1 through stage 4 chronic kidney disease, or unspecified chronic kidney disease: Secondary | ICD-10-CM | POA: Insufficient documentation

## 2021-03-10 DIAGNOSIS — R197 Diarrhea, unspecified: Secondary | ICD-10-CM | POA: Insufficient documentation

## 2021-03-10 DIAGNOSIS — R1084 Generalized abdominal pain: Secondary | ICD-10-CM | POA: Diagnosis not present

## 2021-03-10 DIAGNOSIS — N183 Chronic kidney disease, stage 3 unspecified: Secondary | ICD-10-CM | POA: Insufficient documentation

## 2021-03-10 DIAGNOSIS — Z85528 Personal history of other malignant neoplasm of kidney: Secondary | ICD-10-CM | POA: Insufficient documentation

## 2021-03-10 DIAGNOSIS — R109 Unspecified abdominal pain: Secondary | ICD-10-CM | POA: Diagnosis present

## 2021-03-10 LAB — CBC WITH DIFFERENTIAL/PLATELET
Abs Immature Granulocytes: 0.02 10*3/uL (ref 0.00–0.07)
Basophils Absolute: 0 10*3/uL (ref 0.0–0.1)
Basophils Relative: 0 %
Eosinophils Absolute: 0.7 10*3/uL — ABNORMAL HIGH (ref 0.0–0.5)
Eosinophils Relative: 9 %
HCT: 44.2 % (ref 36.0–46.0)
Hemoglobin: 14.7 g/dL (ref 12.0–15.0)
Immature Granulocytes: 0 %
Lymphocytes Relative: 24 %
Lymphs Abs: 1.9 10*3/uL (ref 0.7–4.0)
MCH: 29.2 pg (ref 26.0–34.0)
MCHC: 33.3 g/dL (ref 30.0–36.0)
MCV: 87.7 fL (ref 80.0–100.0)
Monocytes Absolute: 0.7 10*3/uL (ref 0.1–1.0)
Monocytes Relative: 9 %
Neutro Abs: 4.5 10*3/uL (ref 1.7–7.7)
Neutrophils Relative %: 58 %
Platelets: 301 10*3/uL (ref 150–400)
RBC: 5.04 MIL/uL (ref 3.87–5.11)
RDW: 13 % (ref 11.5–15.5)
WBC: 7.8 10*3/uL (ref 4.0–10.5)
nRBC: 0 % (ref 0.0–0.2)

## 2021-03-10 LAB — URINALYSIS, MICROSCOPIC (REFLEX)

## 2021-03-10 LAB — COMPREHENSIVE METABOLIC PANEL
ALT: 16 U/L (ref 0–44)
AST: 25 U/L (ref 15–41)
Albumin: 3.8 g/dL (ref 3.5–5.0)
Alkaline Phosphatase: 102 U/L (ref 38–126)
Anion gap: 12 (ref 5–15)
BUN: 9 mg/dL (ref 8–23)
CO2: 22 mmol/L (ref 22–32)
Calcium: 9.4 mg/dL (ref 8.9–10.3)
Chloride: 102 mmol/L (ref 98–111)
Creatinine, Ser: 1.38 mg/dL — ABNORMAL HIGH (ref 0.44–1.00)
GFR, Estimated: 43 mL/min — ABNORMAL LOW (ref >60)
Glucose, Bld: 105 mg/dL — ABNORMAL HIGH (ref 70–99)
Potassium: 4.3 mmol/L (ref 3.5–5.1)
Sodium: 136 mmol/L (ref 135–145)
Total Bilirubin: 0.5 mg/dL (ref 0.3–1.2)
Total Protein: 7.9 g/dL (ref 6.5–8.1)

## 2021-03-10 LAB — URINALYSIS, ROUTINE W REFLEX MICROSCOPIC
Bilirubin Urine: NEGATIVE
Glucose, UA: NEGATIVE mg/dL
Ketones, ur: NEGATIVE mg/dL
Leukocytes,Ua: NEGATIVE
Nitrite: NEGATIVE
Protein, ur: NEGATIVE mg/dL
Specific Gravity, Urine: 1.01 (ref 1.005–1.030)
pH: 6 (ref 5.0–8.0)

## 2021-03-10 LAB — LIPASE, BLOOD: Lipase: 29 U/L (ref 11–51)

## 2021-03-10 MED ORDER — ONDANSETRON HCL 4 MG PO TABS: 4.0000 mg | ORAL_TABLET | Freq: Four times a day (QID) | ORAL | 0 refills | Status: DC

## 2021-03-10 MED ORDER — SODIUM CHLORIDE 0.9 % IV BOLUS
1000.0000 mL | Freq: Once | INTRAVENOUS | Status: AC
  Administered 2021-03-10: 1000 mL via INTRAVENOUS

## 2021-03-10 MED ORDER — IOHEXOL 300 MG/ML  SOLN
100.0000 mL | Freq: Once | INTRAMUSCULAR | Status: AC | PRN
  Administered 2021-03-10: 80 mL via INTRAVENOUS

## 2021-03-10 MED ORDER — ONDANSETRON HCL 4 MG/2ML IJ SOLN
4.0000 mg | Freq: Once | INTRAMUSCULAR | Status: AC
  Administered 2021-03-10: 4 mg via INTRAVENOUS
  Filled 2021-03-10: qty 2

## 2021-03-10 NOTE — ED Provider Notes (Signed)
[facility] EMERGENCY DEPARTMENT Provider Note   CSN: [id number] Arrival date & time: 03/10/21  0846     History Chief Complaint  Patient presents with  . Diarrhea  . Abdominal Pain    Cynthia Berger is a 65 y.o. female.  The history is provided by the patient.  Diarrhea Quality:  Watery Severity:  Mild Onset quality:  Gradual Timing:  Constant Progression:  Unchanged Relieved by:  Nothing Worsened by:  Nothing Ineffective treatments:  None tried Associated symptoms: abdominal pain (cramps)   Associated symptoms: no arthralgias, no chills, no recent cough, no diaphoresis, no fever, no headaches, no myalgias, no URI and no vomiting   Risk factors: no recent antibiotic use and no sick contacts        Past Medical History:  Diagnosis Date  . AC (acromioclavicular) joint bone spurs    HEEL BONE SPURS-SURGERY 12/2010 LEFT FOOT  . Antral gastritis   . Cancer of kidney ([place])   . 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 DDD (degenerative disc disease), cervical   . Esophageal dysmotility   . GERD (gastroesophageal reflux disease)   . Heart palpitations   . Hiatal hernia   . Hypertension   . Hyperthyroidism   . LGSIL (low grade squamous intraepithelial dysplasia) 2013    C&B Inadequate ECC--positive LGSIL --  . Multinodular goiter    RIGHT LOBE-- PER BX NEGATIVE  . Positive H. pylori test   . Thyroid nodule   . Vitamin D deficiency 11/2007   LOW AT 18    Patient Active Problem List   Diagnosis Date Noted  . Displaced fracture of proximal phalanx of right great toe with malunion 08/08/2018  . CKD (chronic kidney disease), stage III ([place]) 10/24/2017  . Hyperkalemia 03/24/2016  . Dizziness and giddiness 03/06/2015  . GERD (gastroesophageal reflux disease) 01/08/2014  . Cardiomyopathy 05/16/2012  . Neck pain on right side 04/20/2012  . Multinodular goiter 04/18/2012  . 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 Palpitations 03/07/2012  .  Abdominal bruit 03/07/2012  . Benign essential hypertension 02/23/2012  . History of kidney cancer   . AC (acromioclavicular) joint bone spurs   . Death of family member   . Vitamin D deficiency, unspecified 11/19/2007    Past Surgical History:  Procedure Laterality Date  . ABDOMINAL HYSTERECTOMY N/A 05/28/2013   Procedure: HYSTERECTOMY ABDOMINAL;  Surgeon: Anastasio Auerbach, MD;  Location: [facility];  Service: Gynecology;  Laterality: N/A;  . BIOPSY THYROID Right 2014   ENLARGED THYROID NO MEDS BENIGN  . CERVICAL BIOPSY  W/ LOOP ELECTRODE EXCISION  10/2012   for persistent low-grade dysplasia  . CESAREAN SECTION  1993   W/ TUBAL LIGATION, BILATERAL  . CYSTOSCOPY N/A 05/28/2013   Procedure: CYSTOSCOPY;  Surgeon: Anastasio Auerbach, MD;  Location: [facility];  Service: Gynecology;  Laterality: N/A;  . esophogeal dilatation    . FOOT SURGERY  2012   left heel spur removed  . HYSTEROSCOPY WITH D & C  06/08/2012   Procedure: DILATATION AND CURETTAGE /HYSTEROSCOPY;  Surgeon: Anastasio Auerbach, MD;  Location: [facility];  Service: Gynecology;  Laterality: N/A;  WITH RESECTION OF MYOMA  . NEPHRECTOMY  2005   RIGHT KIDNEY CANCER  . ORIF TOE FRACTURE Right 08/15/2018   Procedure: RIGHT FIFTH TOE OSTEOTOMY AND PINNING;  Surgeon: Marybelle Killings, MD;  Location: [facility];  Service: Orthopedics;  Laterality: Right;  .  ROTATOR CUFF REPAIR  2010   RIGHT  . s/p Right nephrectomy Right   . SALPINGOOPHORECTOMY Bilateral 05/28/2013   Procedure: BILATERAL SALPINGO OOPHORECTOMY;  Surgeon: Anastasio Auerbach, MD;  Location: [facility];  Service: Gynecology;  Laterality: Bilateral;  . TOE SURGERY  2020   baby toe on right foot  . TRANSTHORACIC ECHOCARDIOGRAM  04-10-2012  DR BRIEN CRENSHAW   LVSF MILDLY REDUCED/ EF 45-50%  . TUBAL LIGATION       OB History    Gravida  6   Para  4   Term      Preterm      AB  2   Living  4     SAB  2   IAB      Ectopic       Multiple      Live Births              Family History  Problem Relation Age of Onset  . Hypertension Mother   . Hypertension Sister   . Hypertension Son   . Colon cancer Neg Hx   . Esophageal cancer Neg Hx   . Rectal cancer Neg Hx   . Stomach cancer Neg Hx   . Breast cancer Neg Hx     Social History   Tobacco Use  . Smoking status: Never Smoker  . Smokeless tobacco: Never Used  Vaping Use  . Vaping Use: Never used  Substance Use Topics  . Alcohol use: Not Currently    Alcohol/week: 0.0 standard drinks    Comment: occasional wine  . Drug use: No    Home Medications Prior to Admission medications   Medication Sig Start Date End Date Taking? Authorizing Provider  ondansetron (ZOFRAN) 4 MG tablet Take 1 tablet (4 mg total) by mouth every 6 (six) hours. 03/10/21  Yes Jaheem Hedgepath, DO  amLODipine (NORVASC) 5 MG tablet TAKE 1 TABLET BY MOUTH EVERY DAY 07/20/20   [place], Betty G, MD  cholecalciferol (VITAMIN D) 1000 UNITS tablet Take 1,000 Units by mouth daily.    [provider]  cyclobenzaprine (FLEXERIL) 10 MG tablet Take 1 tablet (10 mg total) by mouth 2 (two) times daily as needed for muscle spasms. Patient taking differently: Take 10 mg by mouth as needed for muscle spasms. 12/12/20   Dorie Rank, MD  diclofenac sodium (VOLTAREN) 1 % GEL Apply 4 g topically 4 (four) times daily. 01/30/19   [place], Betty G, MD  esomeprazole (NEXIUM) 20 MG packet Take 20 mg by mouth daily before breakfast.    [provider]  famotidine (PEPCID) 20 MG tablet Take 1 tablet (20 mg total) by mouth 2 (two) times daily as needed for heartburn or indigestion. 02/26/21   Armbruster, Carlota Raspberry, MD  methimazole (TAPAZOLE) 5 MG tablet Take 0.5 tablets (2.5 mg total) by mouth daily. 01/11/21   Renato Shin, MD  metoprolol succinate (TOPROL-XL) 25 MG 24 hr tablet TAKE 1 TABLET BY MOUTH EVERY DAY 07/10/20   [place], Betty G, MD  Multiple Vitamins-Minerals (MULTIVITAMIN PO) Take 1 tablet by  mouth daily.     [provider]  OVER THE COUNTER MEDICATION Probiotic capsule. One capsule daily.    [provider]  dicyclomine (BENTYL) 20 MG tablet Take 1 tablet (20 mg total) by mouth 2 (two) times daily. 10/21/11 03/07/12  Lafayette Dragon, MD    Allergies    Penicillins  Review of Systems   Review of Systems  Constitutional: Negative for chills,  diaphoresis and fever.  HENT: Negative for ear pain and sore throat.   Eyes: Negative for pain and visual disturbance.  Respiratory: Negative for cough and shortness of breath.   Cardiovascular: Negative for chest pain and palpitations.  Gastrointestinal: Positive for abdominal pain (cramps) and diarrhea. Negative for vomiting.  Genitourinary: Negative for dysuria and hematuria.  Musculoskeletal: Negative for arthralgias, back pain and myalgias.  Skin: Negative for color change and rash.  Neurological: Negative for seizures, syncope and headaches.  All other systems reviewed and are negative.   Physical Exam Updated Vital Signs  ED Triage Vitals  Enc Vitals Group     BP 03/10/21 0901 138/84     Pulse Rate 03/10/21 0901 99     Resp 03/10/21 0901 18     Temp 03/10/21 0901 98.1 F (36.7 C)     Temp Source 03/10/21 0901 Oral     SpO2 03/10/21 0901 98 %     Weight 03/10/21 0902 200 lb (90.7 kg)     Height 03/10/21 0902 5\' 3"  (1.6 m)     Head Circumference --      Peak Flow --      Pain Score 03/10/21 0902 0     Pain Loc --      Pain Edu? --      Excl. in [place]? --     Physical Exam Vitals and nursing note reviewed.  Constitutional:      General: She is not in acute distress.    Appearance: She is well-developed. She is not ill-appearing.  HENT:     Head: Normocephalic and atraumatic.     Mouth/Throat:     Mouth: Mucous membranes are moist.  Eyes:     Extraocular Movements: Extraocular movements intact.     Conjunctiva/sclera: Conjunctivae normal.     Pupils: Pupils are equal, round, and reactive to  light.  Cardiovascular:     Rate and Rhythm: Normal rate and regular rhythm.     Heart sounds: Normal heart sounds. No murmur heard.   Pulmonary:     Effort: Pulmonary effort is normal. No respiratory distress.     Breath sounds: Normal breath sounds.  Abdominal:     General: Abdomen is flat.     Palpations: Abdomen is soft.     Tenderness: There is generalized abdominal tenderness. There is no guarding.     Hernia: No hernia is present.  Musculoskeletal:     Cervical back: Neck supple.  Skin:    General: Skin is warm and dry.     Capillary Refill: Capillary refill takes less than 2 seconds.  Neurological:     General: No focal deficit present.     Mental Status: She is alert.     ED Results / Procedures / Treatments   Labs (all labs ordered are listed, but only abnormal results are displayed) Labs Reviewed  CBC WITH DIFFERENTIAL/PLATELET - Abnormal; Notable for the following components:      Result Value   Eosinophils Absolute 0.7 (*)    All other components within normal limits  COMPREHENSIVE METABOLIC PANEL - Abnormal; Notable for the following components:   Glucose, Bld 105 (*)    Creatinine, Ser 1.38 (*)    GFR, Estimated 43 (*)    All other components within normal limits  URINALYSIS, ROUTINE W REFLEX MICROSCOPIC - Abnormal; Notable for the following components:   Hgb urine dipstick TRACE (*)    All other components within normal limits  URINALYSIS, MICROSCOPIC (REFLEX) -  Abnormal; Notable for the following components:   Bacteria, UA RARE (*)    All other components within normal limits  C DIFFICILE QUICK SCREEN W PCR REFLEX  GASTROINTESTINAL PANEL BY PCR, STOOL (REPLACES STOOL CULTURE)  LIPASE, BLOOD    EKG None  Radiology CT ABDOMEN PELVIS W CONTRAST  Result Date: 03/10/2021 CLINICAL DATA:  Diarrhea with abdominal cramping. EXAM: CT ABDOMEN AND PELVIS WITH CONTRAST TECHNIQUE: Multidetector CT imaging of the abdomen and pelvis was performed using the  standard protocol following bolus administration of intravenous contrast. CONTRAST:  64mL OMNIPAQUE IOHEXOL 300 MG/ML  SOLN COMPARISON:  09/02/2020 FINDINGS: Lower chest:  No contributory findings. Hepatobiliary: No focal liver abnormality.No evidence of biliary obstruction or stone. Pancreas: Unremarkable. Spleen: Unremarkable. Adrenals/Urinary Tract: Negative adrenals. Solitary left kidney. No hydronephrosis or stone. 18 mm left renal cyst. Unremarkable bladder. Stomach/Bowel: No convincing bowel wall thickening but there does appear to be mild stranding of the small bowel mesentery. No appendicitis. No bowel obstruction. Vascular/Lymphatic: No acute vascular abnormality. No mass or adenopathy. Reproductive:Hysterectomy. Other: No ascites or pneumoperitoneum. Musculoskeletal: No acute abnormalities. Facet degeneration with L4-5 anterolisthesis. IMPRESSION: Small bowel mesenteric fat stranding and small volume ascites, question enteritis. No focal inflammation including appendicitis. Electronically Signed   By: Monte Fantasia M.D.   On: 03/10/2021 11:42    Procedures Procedures   Medications Ordered in ED Medications  sodium chloride 0.9 % bolus 1,000 mL (0 mLs Intravenous Stopped 03/10/21 1059)  ondansetron (ZOFRAN) injection 4 mg (4 mg Intravenous Given 03/10/21 0913)  iohexol (OMNIPAQUE) 300 MG/ML solution 100 mL (80 mLs Intravenous Contrast Given 03/10/21 1108)    ED Course  I have reviewed the triage vital signs and the nursing notes.  Pertinent labs & imaging results that were available during my care of the patient were reviewed by me and considered in my medical decision making (see chart for details).    MDM Rules/Calculators/A&P                          Cynthia Berger is here with abdominal pain, diarrhea.  History of hypertension, reflux.  No recent antibiotics.  Normal vitals.  No fever.  Diarrhea for over a week now.  Having some cramping pain still.  Concern for dehydration.  Having  some diffuse abdominal pain on exam.  Will get a CT scan to evaluate for colitis or other infectious process.  Will try to send off C. difficile or GI stool pathogen panel.  Will give IV fluids, IV Zofran.  Will check for electrolyte abnormalities.  Suspect something viral/foodborne.  Lab work showed no significant electrolyte abnormality.  No AKI.  No urinary tract infection.  No leukocytosis or significant anemia.  CT scan overall shows some enteritis changes.  No appendicitis.  Overall appears consistent with likely a viral/foodborne illness.  We will have her follow-up with primary care doctor as she was unable to provide stool sample.  She does have follow-up with GI later this week.  Discharged in good condition.  Tolerating p.o.  This chart was dictated using voice recognition software.  Despite best efforts to proofread,  errors can occur which can change the documentation meaning.    Final Clinical Impression(s) / ED Diagnoses Final diagnoses:  Diarrhea, unspecified type    Rx / DC Orders ED Discharge Orders         Ordered    ondansetron (ZOFRAN) 4 MG tablet  Every 6 hours  03/10/21 [place], [place], DO 03/10/21 1223

## 2021-03-10 NOTE — ED Triage Notes (Signed)
Reports diarrhea for the last week.  Started immodium per GI recommendations but started having cramping so told her to stop.  Continues to have 15-20 loose stools a day.  Reports it happens every time she eats or drinks anything.  Also endorses some pink tinge to the stool.

## 2021-03-10 NOTE — Discharge Instructions (Addendum)
Follow up with PCP, see GI as already scheduled.

## 2021-03-11 ENCOUNTER — Other Ambulatory Visit: Payer: BC Managed Care – PPO

## 2021-03-11 ENCOUNTER — Ambulatory Visit (INDEPENDENT_AMBULATORY_CARE_PROVIDER_SITE_OTHER): Payer: BC Managed Care – PPO | Admitting: Gastroenterology

## 2021-03-11 ENCOUNTER — Encounter: Payer: Self-pay | Admitting: Gastroenterology

## 2021-03-11 DIAGNOSIS — K529 Noninfective gastroenteritis and colitis, unspecified: Secondary | ICD-10-CM

## 2021-03-11 DIAGNOSIS — R109 Unspecified abdominal pain: Secondary | ICD-10-CM

## 2021-03-11 DIAGNOSIS — R197 Diarrhea, unspecified: Secondary | ICD-10-CM

## 2021-03-11 MED ORDER — DICYCLOMINE HCL 10 MG PO CAPS: 10.0000 mg | ORAL_CAPSULE | Freq: Three times a day (TID) | ORAL | 1 refills | Status: DC

## 2021-03-11 NOTE — Progress Notes (Signed)
Agree with assessment and plan as outlined.  

## 2021-03-11 NOTE — Patient Instructions (Addendum)
If you are age 64 or younger, your body mass index should be between 19-25. Your Body mass index is 35.1 kg/m. If this is out of the aformentioned range listed, please consider follow up with your Primary Care Provider.   We have sent the following medications to your pharmacy for you to pick up at your convenience:  START: Bentyl 10mg  take one capsule three times daily.  Please purchase the following medications over the counter and take as directed:  Take Pepto and Imodium as needed.  Your provider has requested that you go to the basement level for lab work before leaving today. Press "B" on the elevator. The lab is located at the first door on the left as you exit the elevator.  Due to recent changes in healthcare laws, you may see the results of your imaging and laboratory studies on MyChart before your provider has had a chance to review them.  We understand that in some cases there may be results that are confusing or concerning to you. Not all laboratory results come back in the same time frame and the provider may be waiting for multiple results in order to interpret others.  Please give [place] 48 hours in order for your provider to thoroughly review all the results before contacting the office for clarification of your results.   Bland Diet A bland diet consists of foods that are often soft and do not have a lot of fat, fiber, or extra seasonings. Foods without fat, fiber, or seasoning are easier for the body to digest. They are also less likely to irritate your mouth, throat, stomach, and other parts of your digestive system. A bland diet is sometimes called a BRAT diet. What is my plan? Your health care provider or food and nutrition specialist (dietitian) may recommend specific changes to your diet to prevent symptoms or to treat your symptoms. These changes may include:  Eating small meals often.  Cooking food until it is soft enough to chew easily.  Chewing your food  well.  Drinking fluids slowly.  Not eating foods that are very spicy, sour, or fatty.  Not eating citrus fruits, such as oranges and grapefruit. What do I need to know about this diet?  Eat a variety of foods from the bland diet food list.  Do not follow a bland diet longer than needed.  Ask your health care provider whether you should take vitamins or supplements. What foods can I eat? Grains Hot cereals, such as cream of wheat. Rice. Bread, crackers, or tortillas made from refined white flour.   Vegetables Canned or cooked vegetables. Mashed or boiled potatoes. Fruits Bananas. Applesauce. Other types of cooked or canned fruit with the skin and seeds removed, such as canned peaches or pears.   Meats and other proteins Scrambled eggs. Creamy peanut butter or other nut butters. Lean, well-cooked meats, such as chicken or fish. Tofu. Soups or broths.   Dairy Low-fat dairy products, such as milk, cottage cheese, or yogurt. Beverages Water. Herbal tea. Apple juice.   Fats and oils Mild salad dressings. Canola or olive oil. Sweets and desserts Pudding. Custard. Fruit gelatin. Ice cream. The items listed above may not be a complete list of recommended foods and beverages. Contact a dietitian for more options. What foods are not recommended? Grains Whole grain breads and cereals. Vegetables Raw vegetables. Fruits Raw fruits, especially citrus, berries, or dried fruits. Dairy Whole fat dairy foods. Beverages Caffeinated drinks. Alcohol. Seasonings and condiments Strongly flavored seasonings  or condiments. Hot sauce. Salsa. Other foods Spicy foods. Fried foods. Sour foods, such as pickled or fermented foods. Foods with high sugar content. Foods high in fiber. The items listed above may not be a complete list of foods and beverages to avoid. Contact a dietitian for more information. Summary  A bland diet consists of foods that are often soft and do not have a lot of fat,  fiber, or extra seasonings.  Foods without fat, fiber, or seasoning are easier for the body to digest.  Check with your health care provider to see how long you should follow this diet plan. It is not meant to be followed for long periods. This information is not intended to replace advice given to you by your health care provider. Make sure you discuss any questions you have with your health care provider. Document Revised: 01/03/2018 Document Reviewed: 01/03/2018 Elsevier Patient Education  2021 [employer].   Make sure you are staying hydrated by drinking 60 ounces of fluid daily.   Thank you for entrusting me with your care and choosing [facility].  Alonza Bogus, PA

## 2021-03-11 NOTE — Progress Notes (Signed)
03/11/2021 Cynthia Berger [id number] [birth year]   HISTORY OF PRESENT ILLNESS: This is a 65 year old female who is a patient of Dr. Doyne Keel.  She was just seen here earlier this month for her GERD/reflux-related issues.  She is here today with complaints of diarrhea and lower abdominal pain.  She says that the symptoms were sudden on onset close to a week ago.  She is having diarrhea and is having a lot of lower abdominal pain/cramping, which usually occurs when she is going to have diarrhea.  At baseline she does not have a lot of pain and appears comfortable here in the office today.  She reports only minimal nausea, no vomiting.  She went to the emergency department yesterday and CT scan of the abdomen and pelvis with contrast showed the following:  IMPRESSION: Small bowel mesenteric fat stranding and small volume ascites, question enteritis. No focal inflammation including appendicitis.  Basic lab studies are unremarkable.  She took some Imodium for the diarrhea, but then seemed to cause her to be constipated.  They ordered stool studies in the ER, but she was not able to have a bowel movement while she was there.   Past Medical History:  Diagnosis Date  . AC (acromioclavicular) joint bone spurs    HEEL BONE SPURS-SURGERY 12/2010 LEFT FOOT  . Antral gastritis   . Cancer of kidney ([place])   . 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 DDD (degenerative disc disease), cervical   . Esophageal dysmotility   . GERD (gastroesophageal reflux disease)   . Heart palpitations   . Hiatal hernia   . Hypertension   . Hyperthyroidism   . LGSIL (low grade squamous intraepithelial dysplasia) 2013    C&B Inadequate ECC--positive LGSIL --  . Multinodular goiter    RIGHT LOBE-- PER BX NEGATIVE  . Positive H. pylori test   . Thyroid nodule   . Vitamin D deficiency 11/2007   LOW AT 18   Past Surgical History:  Procedure Laterality Date  . ABDOMINAL HYSTERECTOMY  N/A 05/28/2013   Procedure: HYSTERECTOMY ABDOMINAL;  Surgeon: Anastasio Auerbach, MD;  Location: [facility];  Service: Gynecology;  Laterality: N/A;  . BIOPSY THYROID Right 2014   ENLARGED THYROID NO MEDS BENIGN  . CERVICAL BIOPSY  W/ LOOP ELECTRODE EXCISION  10/2012   for persistent low-grade dysplasia  . CESAREAN SECTION  1993   W/ TUBAL LIGATION, BILATERAL  . CYSTOSCOPY N/A 05/28/2013   Procedure: CYSTOSCOPY;  Surgeon: Anastasio Auerbach, MD;  Location: [facility];  Service: Gynecology;  Laterality: N/A;  . esophogeal dilatation    . FOOT SURGERY  2012   left heel spur removed  . HYSTEROSCOPY WITH D & C  06/08/2012   Procedure: DILATATION AND CURETTAGE /HYSTEROSCOPY;  Surgeon: Anastasio Auerbach, MD;  Location: [facility];  Service: Gynecology;  Laterality: N/A;  WITH RESECTION OF MYOMA  . NEPHRECTOMY  2005   RIGHT KIDNEY CANCER  . ORIF TOE FRACTURE Right 08/15/2018   Procedure: RIGHT FIFTH TOE OSTEOTOMY AND PINNING;  Surgeon: Marybelle Killings, MD;  Location: [facility];  Service: Orthopedics;  Laterality: Right;  . ROTATOR CUFF REPAIR  2010   RIGHT  . s/p Right nephrectomy Right   . SALPINGOOPHORECTOMY Bilateral 05/28/2013   Procedure: BILATERAL SALPINGO OOPHORECTOMY;  Surgeon: Anastasio Auerbach, MD;  Location: [facility];  Service: Gynecology;  Laterality: Bilateral;  . TOE SURGERY  2020  baby toe on right foot  . TRANSTHORACIC ECHOCARDIOGRAM  04-10-2012  DR BRIEN CRENSHAW   LVSF MILDLY REDUCED/ EF 45-50%  . TUBAL LIGATION      reports that she has never smoked. She has never used smokeless tobacco. She reports previous alcohol use. She reports that she does not use drugs. family history includes Hypertension in her mother, sister, and son. Allergies  Allergen Reactions  . Penicillins Rash      Outpatient Encounter Medications as of 03/11/2021  Medication Sig  . amLODipine (NORVASC) 5 MG tablet TAKE 1 TABLET BY MOUTH EVERY DAY  . cholecalciferol (VITAMIN D)  1000 UNITS tablet Take 1,000 Units by mouth daily.  . cyclobenzaprine (FLEXERIL) 10 MG tablet Take 1 tablet (10 mg total) by mouth 2 (two) times daily as needed for muscle spasms. (Patient taking differently: Take 10 mg by mouth as needed for muscle spasms.)  . diclofenac sodium (VOLTAREN) 1 % GEL Apply 4 g topically 4 (four) times daily.  Marland Kitchen esomeprazole (NEXIUM) 20 MG packet Take 20 mg by mouth daily before breakfast.  . famotidine (PEPCID) 20 MG tablet Take 1 tablet (20 mg total) by mouth 2 (two) times daily as needed for heartburn or indigestion.  . methimazole (TAPAZOLE) 5 MG tablet Take 0.5 tablets (2.5 mg total) by mouth daily.  . metoprolol succinate (TOPROL-XL) 25 MG 24 hr tablet TAKE 1 TABLET BY MOUTH EVERY DAY  . Multiple Vitamins-Minerals (MULTIVITAMIN PO) Take 1 tablet by mouth daily.   . ondansetron (ZOFRAN) 4 MG tablet Take 1 tablet (4 mg total) by mouth every 6 (six) hours.  Marland Kitchen OVER THE COUNTER MEDICATION Probiotic capsule. One capsule daily.  . [DISCONTINUED] dicyclomine (BENTYL) 20 MG tablet Take 1 tablet (20 mg total) by mouth 2 (two) times daily.   No facility-administered encounter medications on file as of 03/11/2021.    REVIEW OF SYSTEMS  : All other systems reviewed and negative except where noted in the History of Present Illness.   PHYSICAL EXAM: BP 118/80 (BP Location: Left Arm, Patient Position: Sitting, Cuff Size: Large)   Pulse 80   Ht 5\' 3"  (1.6 m)   Wt 198 lb 2 oz (89.9 kg)   LMP 08/23/2006   BMI 35.10 kg/m  General: Well developed AA female in no acute distress; comfortable. Head: Normocephalic and atraumatic Eyes:  Sclerae anicteric, conjunctiva pink. Ears: Normal auditory acuity Lungs: Clear throughout to auscultation; no W/R/R Heart: Regular rate and rhythm; no M/R/G. Abdomen: Soft, non-distended.  BS present.  Minimal lower abdominal TTP. Musculoskeletal: Symmetrical with no gross deformities  Skin: No lesions on visible extremities Extremities: No  edema  Neurological: Alert oriented x 4, grossly non-focal Psychological:  Alert and cooperative. Normal mood and affect  ASSESSMENT AND PLAN: *Sudden onset diarrhea and lower abdominal pain:  CT scan suggested enteritis.  Likely infectious source.  Abdominal pain is more so cramping when she has diarrhea.  Will give Bentyl 10 mg 3 times daily.  Prescription sent to pharmacy.  I am going to have her submit stool studies for infectious sources.  I suspect that she will continue to improve over the next several days.  Can use Pepto-Bismol or Imodium in low doses as needed.  She has Zofran for nausea, but nausea has not been a big issue.  Advised to drink plenty of fluids and remain hydrated.  Should follow a brat diet for the next several days.   CC:  [place], Betty G, MD

## 2021-03-12 ENCOUNTER — Ambulatory Visit: Payer: 59 | Admitting: Nurse Practitioner

## 2021-03-12 LAB — CLOSTRIDIUM DIFFICILE TOXIN B, QUALITATIVE, REAL-TIME PCR: Toxigenic C. Difficile by PCR: NOT DETECTED

## 2021-03-15 LAB — GI PROFILE, STOOL, PCR

## 2021-03-16 ENCOUNTER — Telehealth: Payer: Self-pay | Admitting: Gastroenterology

## 2021-03-16 DIAGNOSIS — R197 Diarrhea, unspecified: Secondary | ICD-10-CM

## 2021-03-16 DIAGNOSIS — R109 Unspecified abdominal pain: Secondary | ICD-10-CM

## 2021-03-16 NOTE — Telephone Encounter (Signed)
Patient reports that she is still having diarrhea.  Stool studies are negative.  She is taking dicyclomine TID and using the pepto PRN.  She reports she has more pain when she uses the Pepto.  Please advise next steps.

## 2021-03-16 NOTE — Telephone Encounter (Signed)
Hey Dr. Havery Moros.  Can you review my note and her CT scan and let me know your thoughts on this?  She is calling back with ongoing complaints/pain.  Stool studies are negative.  Should we repeat CT?

## 2021-03-16 NOTE — Telephone Encounter (Signed)
Reviewed prior imaging and her file.  Sounds like bentyl has not helped at all.  If diarrhea persists would obtain fecal lactoferrin to assess for inflammation. Her small bowel looked okay on the CT, it was the mesentery that showed mild inflammation and that is very nonspecific. Would also repeat CBC with an ESR, and CRP as well Can try adding IB gard to see if that helps in the interim. Think we can hold off on CT right now, but consider this pending her course. Not sure if she is using immodium for the diarrhea, but if not, she should use this.

## 2021-03-17 ENCOUNTER — Other Ambulatory Visit (INDEPENDENT_AMBULATORY_CARE_PROVIDER_SITE_OTHER): Payer: 59

## 2021-03-17 DIAGNOSIS — R197 Diarrhea, unspecified: Secondary | ICD-10-CM

## 2021-03-17 DIAGNOSIS — R109 Unspecified abdominal pain: Secondary | ICD-10-CM

## 2021-03-17 LAB — CBC WITH DIFFERENTIAL/PLATELET
Basophils Absolute: 0 10*3/uL (ref 0.0–0.1)
Basophils Relative: 0.4 % (ref 0.0–3.0)
Eosinophils Absolute: 1.4 10*3/uL — ABNORMAL HIGH (ref 0.0–0.7)
Eosinophils Relative: 14.4 % — ABNORMAL HIGH (ref 0.0–5.0)
HCT: 43 % (ref 36.0–46.0)
Hemoglobin: 14.4 g/dL (ref 12.0–15.0)
Lymphocytes Relative: 27.5 % (ref 12.0–46.0)
Lymphs Abs: 2.7 10*3/uL (ref 0.7–4.0)
MCHC: 33.5 g/dL (ref 30.0–36.0)
MCV: 86.1 fl (ref 78.0–100.0)
Monocytes Absolute: 0.9 10*3/uL (ref 0.1–1.0)
Monocytes Relative: 8.8 % (ref 3.0–12.0)
Neutro Abs: 4.7 10*3/uL (ref 1.4–7.7)
Neutrophils Relative %: 48.9 % (ref 43.0–77.0)
Platelets: 257 10*3/uL (ref 150.0–400.0)
RBC: 5 Mil/uL (ref 3.87–5.11)
RDW: 13.7 % (ref 11.5–15.5)
WBC: 9.7 10*3/uL (ref 4.0–10.5)

## 2021-03-17 LAB — C-REACTIVE PROTEIN: CRP: 1.3 mg/dL (ref 0.5–20.0)

## 2021-03-17 LAB — SEDIMENTATION RATE: Sed Rate: 43 mm/hr — ABNORMAL HIGH (ref 0–30)

## 2021-03-17 NOTE — Telephone Encounter (Signed)
Please let her know that I talked to Dr. Havery Moros.  He would like her to have a CBC, sed rate, and CRP as well as a fecal lactoferrin.  He would like her to try IBgard a couple times a day and see if she has tried Imodium again or not.   Thank you,  Jess

## 2021-03-17 NOTE — Telephone Encounter (Signed)
Patient notified She has not been taking the imodium.  She has stopped the pepto and imodium yesterday, an d she reports her abdominal pain has improved.  She will come for labs and try the IBgard

## 2021-03-18 ENCOUNTER — Other Ambulatory Visit: Payer: 59

## 2021-03-18 DIAGNOSIS — R197 Diarrhea, unspecified: Secondary | ICD-10-CM

## 2021-03-18 DIAGNOSIS — R109 Unspecified abdominal pain: Secondary | ICD-10-CM

## 2021-03-19 LAB — FECAL LACTOFERRIN, QUANT
Fecal Lactoferrin: POSITIVE — AB
MICRO NUMBER:: 11716390
SPECIMEN QUALITY:: ADEQUATE

## 2021-04-06 ENCOUNTER — Ambulatory Visit (INDEPENDENT_AMBULATORY_CARE_PROVIDER_SITE_OTHER): Payer: BC Managed Care – PPO | Admitting: Pulmonary Disease

## 2021-04-06 ENCOUNTER — Other Ambulatory Visit: Payer: Self-pay

## 2021-04-06 ENCOUNTER — Encounter: Payer: Self-pay | Admitting: Pulmonary Disease

## 2021-04-06 VITALS — BP 130/78 | HR 84 | Temp 98.1°F | Ht 63.0 in | Wt 197.0 lb

## 2021-04-06 DIAGNOSIS — R0683 Snoring: Secondary | ICD-10-CM

## 2021-04-06 NOTE — Progress Notes (Signed)
[facility]  Chief Complaint  Patient presents with  . Consult    Snoring, her husband witnessed her stop breathing during sleep    Constitutional:  BP 130/78 (BP Location: Left Arm, Patient Position: Sitting, Cuff Size: Normal)   Pulse 84   Temp 98.1 F (36.7 C) (Skin)   Ht 5\' 3"  (1.6 m)   Wt 197 lb (89.4 kg)   LMP 08/23/2006   SpO2 98%   BMI 34.90 kg/m   Past Medical History:  Kidney cancer, GERD, Hiatal hernia, HTN, Hypothyroidism, Vit D deficiency  Past Surgical History:  She  has a past surgical history that includes Cesarean section (1993); Nephrectomy (2005); Rotator cuff repair (2010); Foot surgery (2012); transthoracic echocardiogram (04-10-2012  DR Caren Hazy); Hysteroscopy with D & C (06/08/2012); Cervical biopsy w/ loop electrode excision (10/2012); Tubal ligation; esophogeal dilatation; Salpingoophorectomy (Bilateral, 05/28/2013); Abdominal hysterectomy (N/A, 05/28/2013); Cystoscopy (N/A, 05/28/2013); Biopsy thyroid (Right, 2014); s/p Right nephrectomy (Right); ORIF toe fracture (Right, 08/15/2018); and Toe Surgery (2020).  Brief Summary:  Cynthia Berger is a 65 y.o. female with snoring.      Subjective:   Her husband has been concerned about her snoring.  This has been present for years and getting worse.  He has to wake her up from sleep so she will start breathing again.  This happens mostly when she is on her back.  She is not aware of any issues with her sleep.  She has reflux and sleeps with her head elevated, and this helps her snoring some.  She naps for about an hour several days per week.  She gets funny leg feelings a few times per month.  She goes to sleep at 10 pm.  She falls asleep after a while.  She wakes up some times to use the bathroom if she drinks too much water in the evening.  She gets out of bed at 5 am.  She feels tired in the morning.  She denies morning headache.  She does not use anything to help her  fall sleep or stay awake.  She denies sleep walking, sleep talking, bruxism, or nightmares.  There is no history of restless legs.  She denies sleep hallucinations, sleep paralysis, or cataplexy.  The Epworth score is 12 out of 24.    Physical Exam:   Appearance - well kempt   ENMT - no sinus tenderness, no oral exudate, no LAN, Mallampati 4 airway, no stridor  Respiratory - equal breath sounds bilaterally, no wheezing or rales  CV - s1s2 regular rate and rhythm, no murmurs  Ext - no clubbing, no edema  Skin - no rashes  Psych - normal mood and affect   Sleep Tests:    Social History:  She  reports that she has never smoked. She has never used smokeless tobacco. She reports previous alcohol use. She reports that she does not use drugs.  Family History:  Her family history includes Hypertension in her mother, sister, and son.    Discussion:  She has snoring, sleep disruption, apnea, and daytime sleepiness.  She has history of hypertension.  I am concerned she could have obstructive sleep apnea.  Assessment/Plan:   Snoring with excessive daytime sleepiness. - will need to arrange for a home sleep study  GERD. - followed by Dr. Havery Moros with Gastroenterology - explained how sleep apnea can impact reflux symptoms  Obesity. - discussed how weight can impact sleep and risk for sleep disordered breathing -  discussed options to assist with weight loss: combination of diet modification, cardiovascular and strength training exercises  Cardiovascular risk. - had an extensive discussion regarding the adverse health consequences related to untreated sleep disordered breathing - specifically discussed the risks for hypertension, coronary artery disease, cardiac dysrhythmias, cerebrovascular disease, and diabetes - lifestyle modification discussed  Safe driving practices. - discussed how sleep disruption can increase risk of accidents, particularly when driving - safe  driving practices were discussed  Therapies for obstructive sleep apnea. - if the sleep study shows significant sleep apnea, then various therapies for treatment were reviewed: CPAP, oral appliance, and surgical interventions   Time Spent Involved in Patient Care on Day of Examination:  32 minutes  Follow up:  Patient Instructions  Will arrange for home sleep study Will call to arrange for follow up after sleep study reviewed    Medication List:   Allergies as of 04/06/2021      Reactions   Penicillins Rash      Medication List       Accurate as of April 06, 2021 11:06 AM. If you have any questions, ask your nurse or doctor.        amLODipine 5 MG tablet Commonly known as: NORVASC TAKE 1 TABLET BY MOUTH EVERY DAY   cholecalciferol 1000 units tablet Commonly known as: VITAMIN D Take 1,000 Units by mouth daily.   cyclobenzaprine 10 MG tablet Commonly known as: FLEXERIL Take 1 tablet (10 mg total) by mouth 2 (two) times daily as needed for muscle spasms. What changed: when to take this   diclofenac sodium 1 % Gel Commonly known as: VOLTAREN Apply 4 g topically 4 (four) times daily.   dicyclomine 10 MG capsule Commonly known as: BENTYL Take 1 capsule (10 mg total) by mouth in the morning, at noon, and at bedtime.   esomeprazole 20 MG packet Commonly known as: NEXIUM Take 20 mg by mouth daily before breakfast.   famotidine 20 MG tablet Commonly known as: PEPCID Take 1 tablet (20 mg total) by mouth 2 (two) times daily as needed for heartburn or indigestion.   methimazole 5 MG tablet Commonly known as: TAPAZOLE Take 0.5 tablets (2.5 mg total) by mouth daily.   metoprolol succinate 25 MG 24 hr tablet Commonly known as: TOPROL-XL TAKE 1 TABLET BY MOUTH EVERY DAY   MULTIVITAMIN PO Take 1 tablet by mouth daily.   ondansetron 4 MG tablet Commonly known as: ZOFRAN Take 1 tablet (4 mg total) by mouth every 6 (six) hours.   ondansetron 4 MG tablet Commonly  known as: ZOFRAN TAKE 1 TABLET (4 MG TOTAL) BY MOUTH EVERY 6 (SIX) HOURS.   OVER THE COUNTER MEDICATION Probiotic capsule. One capsule daily.       Signature:  Chesley Mires, MD [place] Pager - [phone] 04/06/2021, 11:06 AM

## 2021-04-06 NOTE — Patient Instructions (Signed)
Will arrange for home sleep study Will call to arrange for follow up after sleep study reviewed  

## 2021-04-09 ENCOUNTER — Telehealth: Payer: Self-pay | Admitting: Pulmonary Disease

## 2021-04-09 NOTE — Telephone Encounter (Signed)
This case has been denied I need to resummit another case Cynthia Berger

## 2021-05-03 ENCOUNTER — Encounter: Payer: Self-pay | Admitting: Pulmonary Disease

## 2021-05-06 ENCOUNTER — Other Ambulatory Visit: Payer: Self-pay

## 2021-05-06 ENCOUNTER — Ambulatory Visit: Payer: BC Managed Care – PPO

## 2021-05-06 DIAGNOSIS — R0683 Snoring: Secondary | ICD-10-CM

## 2021-05-06 DIAGNOSIS — G4733 Obstructive sleep apnea (adult) (pediatric): Secondary | ICD-10-CM | POA: Diagnosis not present

## 2021-05-10 ENCOUNTER — Telehealth: Payer: Self-pay | Admitting: Pulmonary Disease

## 2021-05-10 DIAGNOSIS — G4733 Obstructive sleep apnea (adult) (pediatric): Secondary | ICD-10-CM | POA: Diagnosis not present

## 2021-05-10 NOTE — Telephone Encounter (Signed)
HST 05/06/21 >> AHI 12.2, SpO2 low 85%   Please inform her that her sleep study shows mild obstructive sleep apnea.  Please arrange for ROV with me or NP to discuss treatment options.

## 2021-05-11 NOTE — Telephone Encounter (Signed)
Called and left message on voicemail to please return phone call to go over HST results. Contact number provided.  

## 2021-05-21 ENCOUNTER — Other Ambulatory Visit: Payer: Self-pay | Admitting: Family Medicine

## 2021-05-21 DIAGNOSIS — I10 Essential (primary) hypertension: Secondary | ICD-10-CM

## 2021-05-21 NOTE — Telephone Encounter (Signed)
Called and left message on voicemail to please return phone call to go over HST results. Contact number provided. Letter also printed for patient to please call for result and mailed out due to multiple attempts to reach patient by phone.

## 2021-05-26 ENCOUNTER — Telehealth: Payer: Self-pay | Admitting: Pulmonary Disease

## 2021-05-26 NOTE — Telephone Encounter (Signed)
HST 05/06/21 >> AHI 12.2, SpO2 low 85%   Please inform her that her sleep study shows mild obstructive sleep apnea.  Please arrange for ROV with me or NP to discuss treatment options.  Spoke with pt and notified of results per Dr. Halford Chessman. Pt verbalized understanding and denied any questions. Appt scheduled for 06/16/21. Nothing further needed.

## 2021-06-14 ENCOUNTER — Other Ambulatory Visit: Payer: Self-pay | Admitting: Gastroenterology

## 2021-06-16 ENCOUNTER — Ambulatory Visit (INDEPENDENT_AMBULATORY_CARE_PROVIDER_SITE_OTHER): Payer: BC Managed Care – PPO | Admitting: Pulmonary Disease

## 2021-06-16 ENCOUNTER — Other Ambulatory Visit: Payer: Self-pay

## 2021-06-16 ENCOUNTER — Encounter: Payer: Self-pay | Admitting: Pulmonary Disease

## 2021-06-16 VITALS — BP 124/74 | HR 60 | Temp 98.2°F | Ht 63.0 in | Wt 199.0 lb

## 2021-06-16 DIAGNOSIS — G4733 Obstructive sleep apnea (adult) (pediatric): Secondary | ICD-10-CM | POA: Diagnosis not present

## 2021-06-16 NOTE — Progress Notes (Signed)
[facility]  Chief Complaint  Patient presents with   Follow-up    HST    Constitutional:  BP 124/74 (BP Location: Left Arm, Patient Position: Sitting, Cuff Size: Large)   Pulse 60   Temp 98.2 F (36.8 C) (Oral)   Ht 5\' 3"  (1.6 m)   Wt 199 lb (90.3 kg)   LMP 08/23/2006   SpO2 99%   BMI 35.25 kg/m   Past Medical History:  Kidney cancer, GERD, Hiatal hernia, HTN, Hypothyroidism, Vit D deficiency  Past Surgical History:  She  has a past surgical history that includes Cesarean section (1993); Nephrectomy (2005); Rotator cuff repair (2010); Foot surgery (2012); transthoracic echocardiogram (04-10-2012  DR Caren Hazy); Hysteroscopy with D & C (06/08/2012); Cervical biopsy w/ loop electrode excision (10/2012); Tubal ligation; esophogeal dilatation; Salpingoophorectomy (Bilateral, 05/28/2013); Abdominal hysterectomy (N/A, 05/28/2013); Cystoscopy (N/A, 05/28/2013); Biopsy thyroid (Right, 2014); s/p Right nephrectomy (Right); ORIF toe fracture (Right, 08/15/2018); and Toe Surgery (2020).  Brief Summary:  Charonda Hefter is a 65 y.o. female with obstructive sleep apnea.      Subjective:   She had sleep study in May.  Showed mild sleep apnea.  She is trying to get her weight down.  She doesn't feel like her sleep is an issue, but her husband keeps waking her up because of her snoring.  Physical Exam:   Appearance - well kempt   ENMT - no sinus tenderness, no oral exudate, no LAN, Mallampati 4 airway, no stridor  Respiratory - equal breath sounds bilaterally, no wheezing or rales  CV - s1s2 regular rate and rhythm, no murmurs  Ext - no clubbing, no edema  Skin - no rashes  Psych - normal mood and affect   Sleep Tests:  HST 05/06/21 >> AHI 12.2, SpO2 low 85%  Social History:  She  reports that she has never smoked. She has never used smokeless tobacco. She reports previous alcohol use. She reports that she does not use drugs.  Family  History:  Her family history includes Hypertension in her mother, sister, and son.     Assessment/Plan:   Obstructive sleep apnea. - sleep study reviewed - treatment options discussed - she would like to work on weight loss first and d/w her dentist about oral appliance  GERD. - followed by Dr. Havery Moros with Gastroenterology - explained how sleep apnea can impact reflux symptoms  Time Spent Involved in Patient Care on Day of Examination:  22 minutes  Follow up:   Patient Instructions  Speak with your dentist about whether you would be a candidate for an oral appliance to treat obstructive sleep apnea  Follow up in 6 months  Medication List:   Allergies as of 06/16/2021       Reactions   Penicillins Rash        Medication List        Accurate as of June 16, 2021 12:22 PM. If you have any questions, ask your nurse or doctor.          amLODipine 5 MG tablet Commonly known as: NORVASC TAKE 1 TABLET BY MOUTH EVERY DAY   cholecalciferol 1000 units tablet Commonly known as: VITAMIN D Take 1,000 Units by mouth daily.   cyclobenzaprine 10 MG tablet Commonly known as: FLEXERIL Take 1 tablet (10 mg total) by mouth 2 (two) times daily as needed for muscle spasms. What changed: when to take this   diclofenac sodium 1 % Gel Commonly known as:  VOLTAREN Apply 4 g topically 4 (four) times daily.   dicyclomine 10 MG capsule Commonly known as: BENTYL TAKE ONE CAPSULE BY MOUTH IN THE MORNING, 1 AT NOON, AND 1 AT BEDTIME   esomeprazole 20 MG packet Commonly known as: NEXIUM Take 20 mg by mouth daily before breakfast.   famotidine 20 MG tablet Commonly known as: PEPCID Take 1 tablet (20 mg total) by mouth 2 (two) times daily as needed for heartburn or indigestion.   methimazole 5 MG tablet Commonly known as: TAPAZOLE Take 0.5 tablets (2.5 mg total) by mouth daily.   metoprolol succinate 25 MG 24 hr tablet Commonly known as: TOPROL-XL TAKE 1 TABLET BY MOUTH  EVERY DAY   MULTIVITAMIN PO Take 1 tablet by mouth daily.   ondansetron 4 MG tablet Commonly known as: ZOFRAN Take 1 tablet (4 mg total) by mouth every 6 (six) hours.   ondansetron 4 MG tablet Commonly known as: ZOFRAN TAKE 1 TABLET (4 MG TOTAL) BY MOUTH EVERY 6 (SIX) HOURS.   OVER THE COUNTER MEDICATION Probiotic capsule. One capsule daily.        Signature:  Chesley Mires, MD [place] Pager - [phone] 06/16/2021, 12:22 PM

## 2021-06-16 NOTE — Patient Instructions (Signed)
Speak with your dentist about whether you would be a candidate for an oral appliance to treat obstructive sleep apnea  Follow up in 6 months

## 2021-06-27 ENCOUNTER — Other Ambulatory Visit: Payer: Self-pay | Admitting: Gastroenterology

## 2021-08-11 ENCOUNTER — Other Ambulatory Visit: Payer: Self-pay | Admitting: Family Medicine

## 2021-08-11 DIAGNOSIS — I10 Essential (primary) hypertension: Secondary | ICD-10-CM

## 2021-12-27 ENCOUNTER — Ambulatory Visit: Payer: BC Managed Care – PPO | Admitting: Nurse Practitioner

## 2022-01-12 ENCOUNTER — Ambulatory Visit: Payer: BC Managed Care – PPO | Admitting: Endocrinology

## 2022-01-12 ENCOUNTER — Other Ambulatory Visit: Payer: Self-pay

## 2022-01-12 VITALS — BP 140/84 | HR 73 | Ht 63.0 in | Wt 204.2 lb

## 2022-01-12 DIAGNOSIS — E042 Nontoxic multinodular goiter: Secondary | ICD-10-CM | POA: Diagnosis not present

## 2022-01-12 DIAGNOSIS — E0590 Thyrotoxicosis, unspecified without thyrotoxic crisis or storm: Secondary | ICD-10-CM | POA: Diagnosis not present

## 2022-01-12 LAB — TSH: TSH: 2.05 u[IU]/mL (ref 0.35–5.50)

## 2022-01-12 LAB — T4, FREE: Free T4: 0.74 ng/dL (ref 0.60–1.60)

## 2022-01-12 NOTE — Progress Notes (Signed)
Subjective:    Patient ID: Cynthia Berger, female    DOB: [birth year], 66 y.o.   MRN: [id number]  HPI Pt returns for hyperthyroidism (MNG was dx'ed 2013; bx of [place] nodule #2 was low-risk; f/u US in 2021 was unchanged, and did not meet criteria for bx; TSH was low in 2022, so she was rxx'ed tapazole).  She does not notice any change in the size of the goiter.  pt states she feels well in general.  She takes tapazole as rx'ed.   Past Medical History:  Diagnosis Date   AC (acromioclavicular) joint bone spurs    HEEL BONE SPURS-SURGERY 12/2010 LEFT FOOT   Antral gastritis    Cancer of kidney (HCC)    Closed fracture of fifth toe of left foot with malunion    right   DDD (degenerative disc disease), cervical    Esophageal dysmotility    GERD (gastroesophageal reflux disease)    Heart palpitations    Hiatal hernia    Hypertension    Hyperthyroidism    LGSIL (low grade squamous intraepithelial dysplasia) 2013    C&B Inadequate ECC--positive LGSIL --   Multinodular goiter    RIGHT LOBE-- PER BX NEGATIVE   Positive H. pylori test    Thyroid nodule    Vitamin D deficiency 11/2007   LOW AT 17    Past Surgical History:  Procedure Laterality Date   ABDOMINAL HYSTERECTOMY N/A 05/28/2013   Procedure: HYSTERECTOMY ABDOMINAL;  Surgeon: Anastasio Auerbach, MD;  Location: [facility];  Service: Gynecology;  Laterality: N/A;   BIOPSY THYROID Right 2014   ENLARGED THYROID NO MEDS BENIGN   CERVICAL BIOPSY  W/ LOOP ELECTRODE EXCISION  10/2012   for persistent low-grade dysplasia   CESAREAN SECTION  1993   W/ TUBAL LIGATION, BILATERAL   CYSTOSCOPY N/A 05/28/2013   Procedure: CYSTOSCOPY;  Surgeon: Anastasio Auerbach, MD;  Location: [facility];  Service: Gynecology;  Laterality: N/A;   esophogeal dilatation     FOOT SURGERY  2012   left heel spur removed   HYSTEROSCOPY WITH D & C  06/08/2012   Procedure: DILATATION AND CURETTAGE /HYSTEROSCOPY;  Surgeon: Anastasio Auerbach, MD;  Location: [facility];  Service: Gynecology;  Laterality: N/A;  WITH RESECTION OF MYOMA   NEPHRECTOMY  2005   RIGHT KIDNEY CANCER   ORIF TOE FRACTURE Right 08/15/2018   Procedure: RIGHT FIFTH TOE OSTEOTOMY AND PINNING;  Surgeon: Marybelle Killings, MD;  Location: [facility];  Service: Orthopedics;  Laterality: Right;   ROTATOR CUFF REPAIR  2010   RIGHT   s/p Right nephrectomy Right    SALPINGOOPHORECTOMY Bilateral 05/28/2013   Procedure: BILATERAL SALPINGO OOPHORECTOMY;  Surgeon: Anastasio Auerbach, MD;  Location: [facility];  Service: Gynecology;  Laterality: Bilateral;   TOE SURGERY  2020   baby toe on right foot   TRANSTHORACIC ECHOCARDIOGRAM  04-10-2012  DR BRIEN CRENSHAW   LVSF MILDLY REDUCED/ EF 45-50%   TUBAL LIGATION      Social History   Socioeconomic History   Marital status: Married    Spouse name: Not on file   Number of children: 4   Years of education: Not on file   Highest education level: Not on file  Occupational History   Occupation: [employer]    Employer: [employer]  Tobacco Use   Smoking status: Never   Smokeless tobacco: Never  Vaping Use   Vaping Use: Never used  Substance and  Sexual Activity   Alcohol use: Not Currently    Alcohol/week: 0.0 standard drinks    Comment: occasional wine   Drug use: No   Sexual activity: Yes    Partners: Male    Birth control/protection: Post-menopausal    Comment: INTERCOURSE AGE 56, SEXUAL PARTNERS LESS THAN 5  Other Topics Concern   Not on file  Social History Narrative   Not on file   Social Determinants of Health   Financial Resource Strain: Not on file  Food Insecurity: Not on file  Transportation Needs: Not on file  Physical Activity: Not on file  Stress: Not on file  Social Connections: Not on file  Intimate Partner Violence: Not on file    Current Outpatient Medications on File Prior to Visit  Medication Sig Dispense Refill   amLODipine (NORVASC) 5 MG tablet TAKE ONE TABLET BY MOUTH ONCE DAILY 90  tablet 3   cholecalciferol (VITAMIN D) 1000 UNITS tablet Take 1,000 Units by mouth daily.     cyclobenzaprine (FLEXERIL) 10 MG tablet Take 1 tablet (10 mg total) by mouth 2 (two) times daily as needed for muscle spasms. (Patient taking differently: Take 10 mg by mouth as needed for muscle spasms.) 20 tablet 0   diclofenac sodium (VOLTAREN) 1 % GEL Apply 4 g topically 4 (four) times daily. 4 Tube 3   dicyclomine (BENTYL) 10 MG capsule TAKE ONE CAPSULE BY MOUTH IN THE MORNING, 1 AT NOON, AND 1 AT BEDTIME 270 capsule 1   esomeprazole (NEXIUM) 20 MG packet Take 20 mg by mouth daily before breakfast.     famotidine (PEPCID) 20 MG tablet Take 1 tablet (20 mg total) by mouth 2 (two) times daily as needed for heartburn or indigestion. 180 tablet 3   methimazole (TAPAZOLE) 5 MG tablet Take 0.5 tablets (2.5 mg total) by mouth daily. 45 tablet 3   metoprolol succinate (TOPROL-XL) 25 MG 24 hr tablet TAKE 1 TABLET BY MOUTH EVERY DAY 90 tablet 2   Multiple Vitamins-Minerals (MULTIVITAMIN PO) Take 1 tablet by mouth daily.      ondansetron (ZOFRAN) 4 MG tablet Take 1 tablet (4 mg total) by mouth every 6 (six) hours. 12 tablet 0   ondansetron (ZOFRAN) 4 MG tablet TAKE 1 TABLET (4 MG TOTAL) BY MOUTH EVERY 6 (SIX) HOURS. 12 tablet 0   OVER THE COUNTER MEDICATION Probiotic capsule. One capsule daily.     No current facility-administered medications on file prior to visit.    Allergies  Allergen Reactions   Penicillins Rash    Family History  Problem Relation Age of Onset   Hypertension Mother    Hypertension Sister    Hypertension Son    Colon cancer Neg Hx    Esophageal cancer Neg Hx    Rectal cancer Neg Hx    Stomach cancer Neg Hx    Breast cancer Neg Hx     BP 140/84    Pulse 73    Ht 5\' 3"  (1.6 m)    Wt 204 lb 3.2 oz (92.6 kg)    LMP 08/23/2006    SpO2 98%    BMI 36.17 kg/m    Review of Systems Denies fever    Objective:   Physical Exam  VITAL SIGNS:  See vs page GENERAL: no  distress NECK: 4 cm right thyroid nodule is again noted.     Lab Results  Component Value Date   WBC 9.7 03/17/2021   HGB 14.4 03/17/2021   HCT 43.0 03/17/2021  MCV 86.1 03/17/2021   PLT 257.0 03/17/2021   Lab Results  Component Value Date   TSH 2.05 01/12/2022      Assessment & Plan:  Hyperthyroidism: well-controlled.  Please continue the same methimazole.   Please come back for a follow-up appointment in 6 months

## 2022-01-12 NOTE — Patient Instructions (Addendum)
Blood tests are requested for you today.  We'll let you know about the results.  It is best to never miss the medication.  However, if you do miss it, next best is to double up the next time. If ever you have fever while taking methimazole, stop it and call us, even if the reason is obvious, because of the risk of a rare side-effect.  Please come back for a follow-up appointment in 6 months.   

## 2022-01-29 ENCOUNTER — Other Ambulatory Visit: Payer: Self-pay | Admitting: Family Medicine

## 2022-01-29 ENCOUNTER — Other Ambulatory Visit: Payer: Self-pay | Admitting: Gastroenterology

## 2022-01-29 DIAGNOSIS — I10 Essential (primary) hypertension: Secondary | ICD-10-CM

## 2022-02-01 ENCOUNTER — Other Ambulatory Visit: Payer: Self-pay | Admitting: Family Medicine

## 2022-02-01 ENCOUNTER — Encounter: Payer: Self-pay | Admitting: Nurse Practitioner

## 2022-02-01 ENCOUNTER — Other Ambulatory Visit: Payer: Self-pay

## 2022-02-01 ENCOUNTER — Ambulatory Visit (INDEPENDENT_AMBULATORY_CARE_PROVIDER_SITE_OTHER): Payer: BC Managed Care – PPO | Admitting: Nurse Practitioner

## 2022-02-01 VITALS — BP 134/84 | Ht 62.0 in | Wt 202.0 lb

## 2022-02-01 DIAGNOSIS — Z1231 Encounter for screening mammogram for malignant neoplasm of breast: Secondary | ICD-10-CM

## 2022-02-01 DIAGNOSIS — Z01419 Encounter for gynecological examination (general) (routine) without abnormal findings: Secondary | ICD-10-CM

## 2022-02-01 DIAGNOSIS — Z780 Asymptomatic menopausal state: Secondary | ICD-10-CM | POA: Diagnosis not present

## 2022-02-01 NOTE — Progress Notes (Signed)
° °  Najee Kohrs 1956-04-21 [id number]   History:  66 y.o. [id number] presents for annual exam without GYN complaints. Postmenopausal - no HRT. 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 Normal mammogram history. 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 HTN, hypothyroidism managed by PCP .  Gynecologic History Patient's last menstrual period was 08/23/2006.   Contraception: status post hysterectomy Sexually active: Yes  Health maintenance Last Pap: 12/23/2019. Results were: Normal Last mammogram: 03/03/2021. Results were: Normal Last colonoscopy: 12/25/2017. Results were: Normal, 10-year recall Last Dexa: 01/09/2020. Results were: Normal, 5-year recall  Past medical history, past surgical history, family history and social history were all reviewed and documented in the EPIC chart. Married. Retired. 4 children, all live local.   ROS:  A ROS was performed and pertinent positives and negatives are included.  Exam:  Vitals:   02/01/22 1456  BP: 134/84  Weight: 202 lb (91.6 kg)  Height: 5\' 2"  (1.575 m)    Body mass index is 36.95 kg/m.  General appearance:  Normal Thyroid:  Symmetrical, normal in size, without palpable masses or nodularity. Respiratory  Auscultation:  Clear without wheezing or rhonchi Cardiovascular  Auscultation:  Regular rate, without rubs, murmurs or gallops  Edema/varicosities:  Not grossly evident Abdominal  Soft,nontender, without masses, guarding or rebound.  Liver/spleen:  No organomegaly noted  Hernia:  None appreciated  Skin  Inspection:  Grossly normal   Breasts: Examined lying and sitting.   Right: Without masses, retractions, discharge or axillary adenopathy.   Left: Without masses, retractions, discharge or axillary adenopathy. Genitourinary   Inguinal/mons:  Normal without inguinal adenopathy  External genitalia:  Normal appearing vulva with no masses, tenderness, or lesions  BUS/Urethra/Skene's glands:   Normal  Vagina:  Normal appearing with normal color and discharge, no lesions  Cervix:  Absent  Uterus: Absent  Adnexa/parametria:     Rt: Normal in size, without masses or tenderness.   Lt: Normal in size, without masses or tenderness.  Anus and perineum: Normal  Digital rectal exam: Normal sphincter tone without palpated masses or tenderness  Patient informed chaperone available to be present for breast and pelvic exam. Patient has requested no chaperone to be present. Patient has been advised what will be completed during breast and pelvic exam.   Assessment/Plan:  66 y.o. [id number] for annual exam.   Well woman exam with routine gynecological exam - Education provided on SBEs, importance of preventative screenings, current guidelines, high calcium diet, regular exercise, and multivitamin daily. Labs with PCP.   Postmenopausal - no HRT. 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  Screening for cervical cancer - History of persistent CIN-1. Normal paps since hysterectomy. Will plan to repeat pap next year at 3-year interval then consider stopping.   Screening for breast cancer - Normal mammogram history.  Continue annual screenings.  Normal breast exam today. Schedule next month.   Screening for colon cancer - 2019 colonoscopy. Will repeat at GI's recommended interval.   Screening for osteoporosis - Normal bone density in 2021. Will plan to repeat DXA at 5-year interval per recommendation.   Follow up in 1 year for annual.     Tamela Gammon [facility], 3:21 PM 02/01/2022

## 2022-02-11 ENCOUNTER — Other Ambulatory Visit: Payer: Self-pay | Admitting: Endocrinology

## 2022-02-25 NOTE — Progress Notes (Deleted)
? ? ?ACUTE VISIT ?No chief complaint on file. ? ?HPI: ?Ms.Donovan Hibbitts is a 66 y.o. female, who is here today complaining of *** ?HPI ? ?Review of Systems ?Rest see pertinent positives and negatives per HPI. ? ?Current Outpatient Medications on File Prior to Visit  ?Medication Sig Dispense Refill  ?? amLODipine (NORVASC) 5 MG tablet TAKE ONE TABLET BY MOUTH ONCE DAILY 90 tablet 3  ?? cholecalciferol (VITAMIN D) 1000 UNITS tablet Take 1,000 Units by mouth daily.    ?? famotidine (PEPCID) 20 MG tablet TAKE ONEK TABLET BY MOUTH TWICE DAILY AS NEEDED FOR HEARTBURN/INDIGESTION 180 tablet 1  ?? methimazole (TAPAZOLE) 5 MG tablet TAKE 1/2 TABLET BY MOUTH ONCE DAILY 45 tablet 2  ?? metoprolol succinate (TOPROL-XL) 25 MG 24 hr tablet TAKE 1 TABLET BY MOUTH EVERY DAY 90 tablet 2  ?? Multiple Vitamins-Minerals (MULTIVITAMIN PO) Take 1 tablet by mouth daily.     ?? OVER THE COUNTER MEDICATION Probiotic capsule. One capsule daily.    ? ?No current facility-administered medications on file prior to visit.  ? ? ? ?Past Medical History:  ?Diagnosis Date  ?? AC (acromioclavicular) joint bone spurs   ? HEEL BONE SPURS-SURGERY 12/2010 LEFT FOOT  ?? Antral gastritis   ?? Cancer of kidney ([place])   ?? Closed fracture of fifth toe of left foot with malunion   ? right  ?? DDD (degenerative disc disease), cervical   ?? Esophageal dysmotility   ?? GERD (gastroesophageal reflux disease)   ?? Heart palpitations   ?? Hiatal hernia   ?? Hypertension   ?? Hyperthyroidism   ?? LGSIL (low grade squamous intraepithelial dysplasia) 2013  ?  C&B Inadequate ECC--positive LGSIL --  ?? Multinodular goiter   ? RIGHT LOBE-- PER BX NEGATIVE  ?? Positive H. pylori test   ?? Thyroid nodule   ?? Vitamin D deficiency 11/2007  ? LOW AT 17  ? ?Allergies  ?Allergen Reactions  ?? Penicillins Rash  ? ? ?Social History  ? ?Socioeconomic History  ?? Marital status: Married  ?  Spouse name: Not on file  ?? Number of children: 4  ?? Years of education: Not on file  ??  Highest education level: Not on file  ?Occupational History  ?? Occupation: [employer]  ?  Employer: [employer]  ?Tobacco Use  ?? Smoking status: Never  ?? Smokeless tobacco: Never  ?Vaping Use  ?? Vaping Use: Never used  ?Substance and Sexual Activity  ?? Alcohol use: Yes  ?  Comment: occasional wine  ?? Drug use: No  ?? Sexual activity: Yes  ?  Partners: Male  ?  Birth control/protection: Post-menopausal  ?  Comment: INTERCOURSE AGE 30, SEXUAL PARTNERS LESS THAN 5  ?Other Topics Concern  ?? Not on file  ?Social History Narrative  ?? Not on file  ? ?Social Determinants of Health  ? ?Financial Resource Strain: Not on file  ?Food Insecurity: Not on file  ?Transportation Needs: Not on file  ?Physical Activity: Not on file  ?Stress: Not on file  ?Social Connections: Not on file  ? ? ?There were no vitals filed for this visit. ?There is no height or weight on file to calculate BMI. ? ?Physical Exam ? ?ASSESSMENT AND PLAN: ? ?There are no diagnoses linked to this encounter. ? ? ?No follow-ups on file. ? ? ?Betty G. Martinique, MD ? ?[place]. ?[place] office. ? ?Discharge Instructions   ?None ?  ? ? ? ? ? ? ? ? ? ? ? ? ?

## 2022-02-28 ENCOUNTER — Ambulatory Visit: Payer: BC Managed Care – PPO | Admitting: Family Medicine

## 2022-03-04 ENCOUNTER — Other Ambulatory Visit: Payer: Self-pay

## 2022-03-04 ENCOUNTER — Ambulatory Visit
Admission: RE | Admit: 2022-03-04 | Discharge: 2022-03-04 | Disposition: A | Discharge status: Home / Self Care | Payer: BC Managed Care – PPO | Source: Ambulatory Visit | Attending: Family Medicine | Admitting: Family Medicine

## 2022-03-04 DIAGNOSIS — Z1231 Encounter for screening mammogram for malignant neoplasm of breast: Secondary | ICD-10-CM

## 2022-03-07 ENCOUNTER — Other Ambulatory Visit: Payer: Self-pay | Admitting: Family Medicine

## 2022-03-07 DIAGNOSIS — R928 Other abnormal and inconclusive findings on diagnostic imaging of breast: Secondary | ICD-10-CM

## 2022-03-19 ENCOUNTER — Ambulatory Visit: Payer: BC Managed Care – PPO

## 2022-03-19 ENCOUNTER — Ambulatory Visit
Admission: RE | Admit: 2022-03-19 | Discharge: 2022-03-19 | Disposition: A | Discharge status: Home / Self Care | Payer: BC Managed Care – PPO | Source: Ambulatory Visit | Attending: Family Medicine | Admitting: Family Medicine

## 2022-03-19 DIAGNOSIS — R928 Other abnormal and inconclusive findings on diagnostic imaging of breast: Secondary | ICD-10-CM

## 2022-04-15 ENCOUNTER — Encounter: Payer: Self-pay | Admitting: Family Medicine

## 2022-04-15 ENCOUNTER — Ambulatory Visit (INDEPENDENT_AMBULATORY_CARE_PROVIDER_SITE_OTHER): Payer: BC Managed Care – PPO | Admitting: Family Medicine

## 2022-04-15 VITALS — BP 132/80 | HR 67 | Resp 16 | Ht 62.0 in | Wt 203.4 lb

## 2022-04-15 DIAGNOSIS — Z1329 Encounter for screening for other suspected endocrine disorder: Secondary | ICD-10-CM | POA: Diagnosis not present

## 2022-04-15 DIAGNOSIS — Z13220 Encounter for screening for lipoid disorders: Secondary | ICD-10-CM | POA: Diagnosis not present

## 2022-04-15 DIAGNOSIS — Z0000 Encounter for general adult medical examination without abnormal findings: Secondary | ICD-10-CM | POA: Diagnosis not present

## 2022-04-15 DIAGNOSIS — Z130 Encounter for screening for diseases of the blood and blood-forming organs and certain disorders involving the immune mechanism: Secondary | ICD-10-CM

## 2022-04-15 DIAGNOSIS — Z13228 Encounter for screening for other metabolic disorders: Secondary | ICD-10-CM

## 2022-04-15 DIAGNOSIS — I10 Essential (primary) hypertension: Secondary | ICD-10-CM

## 2022-04-15 DIAGNOSIS — I5020 Unspecified systolic (congestive) heart failure: Secondary | ICD-10-CM

## 2022-04-15 DIAGNOSIS — C649 Malignant neoplasm of unspecified kidney, except renal pelvis: Secondary | ICD-10-CM

## 2022-04-15 LAB — HEMOGLOBIN A1C: Hgb A1c MFr Bld: 5.5 % (ref 4.6–6.5)

## 2022-04-15 LAB — COMPREHENSIVE METABOLIC PANEL
ALT: 17 U/L (ref 0–35)
AST: 23 U/L (ref 0–37)
Albumin: 4.1 g/dL (ref 3.5–5.2)
Alkaline Phosphatase: 87 U/L (ref 39–117)
BUN: 14 mg/dL (ref 6–23)
CO2: 27 mEq/L (ref 19–32)
Calcium: 9.3 mg/dL (ref 8.4–10.5)
Chloride: 103 mEq/L (ref 96–112)
Creatinine, Ser: 1.18 mg/dL (ref 0.40–1.20)
GFR: 48.67 mL/min — ABNORMAL LOW (ref >60.00)
Glucose, Bld: 86 mg/dL (ref 70–99)
Potassium: 4.3 mEq/L (ref 3.5–5.1)
Sodium: 137 mEq/L (ref 135–145)
Total Bilirubin: 0.5 mg/dL (ref 0.2–1.2)
Total Protein: 7.6 g/dL (ref 6.0–8.3)

## 2022-04-15 LAB — LIPID PANEL
Cholesterol: 191 mg/dL (ref 0–200)
HDL: 54.4 mg/dL (ref >39.00)
LDL Cholesterol: 118 mg/dL — ABNORMAL HIGH (ref 0–99)
NonHDL: 136.3
Total CHOL/HDL Ratio: 4
Triglycerides: 91 mg/dL (ref 0.0–149.0)
VLDL: 18.2 mg/dL (ref 0.0–40.0)

## 2022-04-15 NOTE — Assessment & Plan Note (Signed)
BP is adequately controlled. ?Continue she is on Metoprolol succinate 25 mg daily and Amlodipine 5 mg daily as well as low salt diet. ?Continue monitoring BP regularly. ?

## 2022-04-15 NOTE — Patient Instructions (Addendum)
A few things to remember from today's visit: ? ?Routine general medical examination at a health care facility ? ?Screening for lipoid disorders - Plan: Lipid panel ? ?Screening for endocrine, metabolic and immunity disorder - Plan: Comprehensive metabolic panel, Hemoglobin A1c ? ?Benign essential hypertension ? ?Do not use My Chart to request refills or for acute issues that need immediate attention. ?  ?Please be sure medication list is accurate. ?If a new problem present, please set up appointment sooner than planned today. ? ? ?Health Maintenance, Female ?Adopting a healthy lifestyle and getting preventive care are important in promoting health and wellness. Ask your health care provider about: ?The right schedule for you to have regular tests and exams. ?Things you can do on your own to prevent diseases and keep yourself healthy. ?What should I know about diet, weight, and exercise? ?Eat a healthy diet ? ?Eat a diet that includes plenty of vegetables, fruits, low-fat dairy products, and lean protein. ?Do not eat a lot of foods that are high in solid fats, added sugars, or sodium. ?Maintain a healthy weight ?Body mass index (BMI) is used to identify weight problems. It estimates body fat based on height and weight. Your health care provider can help determine your BMI and help you achieve or maintain a healthy weight. ?Get regular exercise ?Get regular exercise. This is one of the most important things you can do for your health. Most adults should: ?Exercise for at least 150 minutes each week. The exercise should increase your heart rate and make you sweat (moderate-intensity exercise). ?Do strengthening exercises at least twice a week. This is in addition to the moderate-intensity exercise. ?Spend less time sitting. Even light physical activity can be beneficial. ?Watch cholesterol and blood lipids ?Have your blood tested for lipids and cholesterol at 65 years of age, then have this test every 5 years. ?Have  your cholesterol levels checked more often if: ?Your lipid or cholesterol levels are high. ?You are older than 65 years of age. ?You are at high risk for heart disease. ?What should I know about cancer screening? ?Depending on your health history and family history, you may need to have cancer screening at various ages. This may include screening for: ?Breast cancer. ?Cervical cancer. ?Colorectal cancer. ?Skin cancer. ?Lung cancer. ?What should I know about heart disease, diabetes, and high blood pressure? ?Blood pressure and heart disease ?High blood pressure causes heart disease and increases the risk of stroke. This is more likely to develop in people who have high blood pressure readings or are overweight. ?Have your blood pressure checked: ?Every 3-5 years if you are 65-53 years of age. ?Every year if you are 65 years old or older. ?Diabetes ?Have regular diabetes screenings. This checks your fasting blood sugar level. Have the screening done: ?Once every three years after age 29 if you are at a normal weight and have a low risk for diabetes. ?More often and at a younger age if you are overweight or have a high risk for diabetes. ?What should I know about preventing infection? ?Hepatitis B ?If you have a higher risk for hepatitis B, you should be screened for this virus. Talk with your health care provider to find out if you are at risk for hepatitis B infection. ?Hepatitis C ?Testing is recommended for: ?Everyone born from [birth year] through 1964. ?Anyone with known risk factors for hepatitis C. ?Sexually transmitted infections (STIs) ?Get screened for STIs, including gonorrhea and chlamydia, if: ?You are sexually active and are younger  than 65 years of age. ?You are older than 65 years of age and your health care provider tells you that you are at risk for this type of infection. ?Your sexual activity has changed since you were last screened, and you are at increased risk for chlamydia or gonorrhea. Ask your health  care provider if you are at risk. ?Ask your health care provider about whether you are at high risk for HIV. Your health care provider may recommend a prescription medicine to help prevent HIV infection. If you choose to take medicine to prevent HIV, you should first get tested for HIV. You should then be tested every 3 months for as long as you are taking the medicine. ?Pregnancy ?If you are about to stop having your period (premenopausal) and you may become pregnant, seek counseling before you get pregnant. ?Take 400 to 800 micrograms (mcg) of folic acid every day if you become pregnant. ?Ask for birth control (contraception) if you want to prevent pregnancy. ?Osteoporosis and menopause ?Osteoporosis is a disease in which the bones lose minerals and strength with aging. This can result in bone fractures. If you are 76 years old or older, or if you are at risk for osteoporosis and fractures, ask your health care provider if you should: ?Be screened for bone loss. ?Take a calcium or vitamin D supplement to lower your risk of fractures. ?Be given hormone replacement therapy (HRT) to treat symptoms of menopause. ?Follow these instructions at home: ?Alcohol use ?Do not drink alcohol if: ?Your health care provider tells you not to drink. ?You are pregnant, may be pregnant, or are planning to become pregnant. ?If you drink alcohol: ?Limit how much you have to: ?0-1 drink a day. ?Know how much alcohol is in your drink. In the [place] one drink equals one 12 oz bottle of beer (355 mL), one 5 oz glass of wine (148 mL), or one 1? oz glass of hard liquor (44 mL). ?Lifestyle ?Do not use any products that contain nicotine or tobacco. These products include cigarettes, chewing tobacco, and vaping devices, such as e-cigarettes. If you need help quitting, ask your health care provider. ?Do not use street drugs. ?Do not share needles. ?Ask your health care provider for help if you need support or information about quitting  drugs. ?General instructions ?Schedule regular health, dental, and eye exams. ?Stay current with your vaccines. ?Tell your health care provider if: ?You often feel depressed. ?You have ever been abused or do not feel safe at home. ?Summary ?Adopting a healthy lifestyle and getting preventive care are important in promoting health and wellness. ?Follow your health care provider's instructions about healthy diet, exercising, and getting tested or screened for diseases. ?Follow your health care provider's instructions on monitoring your cholesterol and blood pressure. ?This information is not intended to replace advice given to you by your health care provider. Make sure you discuss any questions you have with your health care provider. ?Document Revised: 04/26/2021 Document Reviewed: 04/26/2021 ?Elsevier Patient Education ? [place]. ? ?

## 2022-04-15 NOTE — Progress Notes (Addendum)
? ?HPI: ?[name] is a 66 y.o. female, who is here today for her routine physical. ? ?Last CPE: 02/03/21 ? ?Regular exercise: Until a months ago she was exercising some but frustrated because did not notice wt loss. ?Following a healthful diet: She has not been consistent but recently she started intermittent fasting, eats breakfast and dinner, 8 hours fasting. ? ?Chronic medical problems: Vit D def,HTN, CKD III,multinodular goiter, OSA,solitary kidney, and hypothyroidism among some. ?She sees endocrinologist regularly, Dr Loanne Drilling. ?Pulmonologist, Dr Halford Chessman, for sleep apnea. ? ?Immunization History  ?Administered Date(s) Administered  ? Influenza,inj,Quad PF,6+ Mos 10/24/2017, 01/03/2019, 02/03/2020  ? Influenza-Unspecified 09/10/2013  ? PFIZER(Purple Top)SARS-COV-2 Vaccination 02/26/2020, 03/18/2020, 10/09/2020  ? Pneumococcal Polysaccharide-23 10/24/2017  ? Tdap 06/09/2014  ?Shingrix completed 2 years ago at her pharmacy. ? ?Health Maintenance  ?Topic Date Due  ? COVID-19 Vaccine (4 - Booster for [place] series) 05/01/2022 (Originally 12/04/2020)  ? Zoster Vaccines- Shingrix (1 of 2) 07/15/2022 (Originally 05/12/1975)  ? INFLUENZA VACCINE  07/19/2022  ? PAP SMEAR-Modifier  12/18/2023  ? MAMMOGRAM  03/04/2024  ? TETANUS/TDAP  06/09/2024  ? COLONOSCOPY (Pts 45-48yr Insurance coverage will need to be confirmed)  12/26/2027  ? Hepatitis C Screening  Completed  ? HIV Screening  Completed  ? HPV VACCINES  Aged Out  ? ?Pap smear: S/P hysterectomy. ?She follows with gyn regularly, last visit 12/2021. ? ?She has no concerns today. ? ?She follows with urologist q 3 months and according to pt, renal function is being monitoring. ?She checks BP at work and around 130/80 or less. ?HFrEF with echocardiogram in April of 2013 showed an ejection fraction of 45-50%. ?Negative for orthopnea and PND. ?Last saw Dr CStanford Breed5/29/2013. ?She is on Metoprolol succinate 25 mg daily and Amlodipine 5 mg daily. ? ?Lab Results  ?Component  Value Date  ? CHOL 198 02/03/2021  ? HDL 58.20 02/03/2021  ? LDLCALC 119 (H) 02/03/2021  ? TRIG 104.0 02/03/2021  ? CHOLHDL 3 02/03/2021  ? ?Review of Systems  ?Constitutional:  Negative for appetite change, fatigue and fever.  ?HENT:  Negative for hearing loss, mouth sores, sore throat, trouble swallowing and voice change.   ?Eyes:  Negative for redness and visual disturbance.  ?Respiratory:  Negative for cough, shortness of breath and wheezing.   ?Cardiovascular:  Negative for chest pain and leg swelling.  ?Gastrointestinal:  Negative for abdominal pain, nausea and vomiting.  ?     No changes in bowel habits.  ?Endocrine: Negative for cold intolerance, heat intolerance, polydipsia, polyphagia and polyuria.  ?Genitourinary:  Negative for decreased urine volume, dysuria, hematuria, vaginal bleeding and vaginal discharge.  ?Musculoskeletal:  Negative for gait problem and myalgias.  ?Skin:  Negative for color change and rash.  ?Allergic/Immunologic: Negative for environmental allergies.  ?Neurological:  Negative for syncope, weakness and headaches.  ?Hematological:  Negative for adenopathy. Does not bruise/bleed easily.  ?Psychiatric/Behavioral:  Negative for confusion and sleep disturbance. The patient is not nervous/anxious.   ?All other systems reviewed and are negative. ? ?Current Outpatient Medications on File Prior to Visit  ?Medication Sig Dispense Refill  ? amLODipine (NORVASC) 5 MG tablet TAKE ONE TABLET BY MOUTH ONCE DAILY 90 tablet 3  ? cholecalciferol (VITAMIN D) 1000 UNITS tablet Take 1,000 Units by mouth daily.    ? famotidine (PEPCID) 20 MG tablet TAKE ONEK TABLET BY MOUTH TWICE DAILY AS NEEDED FOR HEARTBURN/INDIGESTION 180 tablet 1  ? methimazole (TAPAZOLE) 5 MG tablet TAKE 1/2 TABLET BY MOUTH ONCE DAILY 45 tablet  2  ? metoprolol succinate (TOPROL-XL) 25 MG 24 hr tablet TAKE 1 TABLET BY MOUTH EVERY DAY 90 tablet 2  ? Multiple Vitamins-Minerals (MULTIVITAMIN PO) Take 1 tablet by mouth daily.     ? OVER  THE COUNTER MEDICATION Probiotic capsule. One capsule daily.    ? ?No current facility-administered medications on file prior to visit.  ? ?Past Medical History:  ?Diagnosis Date  ? AC (acromioclavicular) joint bone spurs   ? HEEL BONE SPURS-SURGERY 12/2010 LEFT FOOT  ? Antral gastritis   ? Cancer of kidney ([place])   ? Closed fracture of fifth toe of left foot with malunion   ? right  ? DDD (degenerative disc disease), cervical   ? Esophageal dysmotility   ? GERD (gastroesophageal reflux disease)   ? Heart palpitations   ? Hiatal hernia   ? Hypertension   ? Hyperthyroidism   ? LGSIL (low grade squamous intraepithelial dysplasia) 2013  ?  C&B Inadequate ECC--positive LGSIL --  ? Multinodular goiter   ? RIGHT LOBE-- PER BX NEGATIVE  ? Positive H. pylori test   ? Thyroid nodule   ? Vitamin D deficiency 11/2007  ? LOW AT 17  ? ?Past Surgical History:  ?Procedure Laterality Date  ? ABDOMINAL HYSTERECTOMY N/A 05/28/2013  ? Procedure: HYSTERECTOMY ABDOMINAL;  Surgeon: Anastasio Auerbach, MD;  Location: [facility];  Service: Gynecology;  Laterality: N/A;  ? BIOPSY THYROID Right 2014  ? ENLARGED THYROID NO MEDS BENIGN  ? CERVICAL BIOPSY  W/ LOOP ELECTRODE EXCISION  10/2012  ? for persistent low-grade dysplasia  ? [place]  ? W/ TUBAL LIGATION, BILATERAL  ? CYSTOSCOPY N/A 05/28/2013  ? Procedure: CYSTOSCOPY;  Surgeon: Anastasio Auerbach, MD;  Location: [facility];  Service: Gynecology;  Laterality: N/A;  ? esophogeal dilatation    ? FOOT SURGERY  2012  ? left heel spur removed  ? HYSTEROSCOPY WITH D & C  06/08/2012  ? Procedure: DILATATION AND CURETTAGE /HYSTEROSCOPY;  Surgeon: Anastasio Auerbach, MD;  Location: [facility];  Service: Gynecology;  Laterality: N/A;  WITH RESECTION OF MYOMA  ? NEPHRECTOMY  2005  ? RIGHT KIDNEY CANCER  ? ORIF TOE FRACTURE Right 08/15/2018  ? Procedure: RIGHT FIFTH TOE OSTEOTOMY AND PINNING;  Surgeon: Marybelle Killings, MD;  Location: [facility];  Service: Orthopedics;   Laterality: Right;  ? [place]  2010  ? RIGHT  ? s/p Right nephrectomy Right   ? SALPINGOOPHORECTOMY Bilateral 05/28/2013  ? Procedure: BILATERAL SALPINGO OOPHORECTOMY;  Surgeon: Anastasio Auerbach, MD;  Location: [facility];  Service: Gynecology;  Laterality: Bilateral;  ? TOE SURGERY  2020  ? baby toe on right foot  ? TRANSTHORACIC ECHOCARDIOGRAM  04-10-2012  DR Warden Fillers CRENSHAW  ? LVSF MILDLY REDUCED/ EF 45-50%  ? TUBAL LIGATION    ? ? ?Allergies  ?Allergen Reactions  ? Penicillins Rash  ? ?Family History  ?Problem Relation Age of Onset  ? Hypertension Mother   ? Hypertension Sister   ? Hypertension Son   ? Colon cancer Neg Hx   ? Esophageal cancer Neg Hx   ? Rectal cancer Neg Hx   ? Stomach cancer Neg Hx   ? Breast cancer Neg Hx   ? ?Social History  ? ?Socioeconomic History  ? Marital status: Married  ?  Spouse name: Not on file  ? Number of children: 4  ? Years of education: Not on file  ? Highest education level: Not  on file  ?Occupational History  ? Occupation: [employer]  ?  Employer: [employer]  ?Tobacco Use  ? Smoking status: Never  ? Smokeless tobacco: Never  ?Vaping Use  ? Vaping Use: Never used  ?Substance and Sexual Activity  ? Alcohol use: Yes  ?  Comment: occasional wine  ? Drug use: No  ? Sexual activity: Yes  ?  Partners: Male  ?  Birth control/protection: Post-menopausal  ?  Comment: INTERCOURSE AGE 47, SEXUAL PARTNERS LESS THAN 5  ?Other Topics Concern  ? Not on file  ?Social History Narrative  ? Not on file  ? ?Social Determinants of Health  ? ?Financial Resource Strain: Not on file  ?Food Insecurity: Not on file  ?Transportation Needs: Not on file  ?Physical Activity: Not on file  ?Stress: Not on file  ?Social Connections: Not on file  ? ?Vitals:  ? 04/15/22 0914  ?BP: 132/80  ?Pulse: 67  ?Resp: 16  ?SpO2: 99%  ? ?Body mass index is 37.2 kg/m?. ? ?Wt Readings from Last 3 Encounters:  ?04/15/22 203 lb 6 oz (92.3 kg)  ?02/01/22 202 lb (91.6 kg)  ?01/12/22 204 lb 3.2 oz (92.6 kg)   ? ?Physical Exam ?Vitals and nursing note reviewed.  ?Constitutional:   ?   General: She is not in acute distress. ?   Appearance: She is well-developed.  ?HENT:  ?   Head: Normocephalic and atraumatic.  ?

## 2022-04-17 DIAGNOSIS — I5020 Unspecified systolic (congestive) heart failure: Secondary | ICD-10-CM | POA: Insufficient documentation

## 2022-04-17 DIAGNOSIS — C649 Malignant neoplasm of unspecified kidney, except renal pelvis: Secondary | ICD-10-CM | POA: Insufficient documentation

## 2022-05-24 DIAGNOSIS — H25811 Combined forms of age-related cataract, right eye: Secondary | ICD-10-CM | POA: Diagnosis not present

## 2022-05-24 DIAGNOSIS — H52223 Regular astigmatism, bilateral: Secondary | ICD-10-CM | POA: Diagnosis not present

## 2022-05-24 DIAGNOSIS — H35373 Puckering of macula, bilateral: Secondary | ICD-10-CM | POA: Diagnosis not present

## 2022-05-24 DIAGNOSIS — H25812 Combined forms of age-related cataract, left eye: Secondary | ICD-10-CM | POA: Diagnosis not present

## 2022-05-24 DIAGNOSIS — H40023 Open angle with borderline findings, high risk, bilateral: Secondary | ICD-10-CM | POA: Diagnosis not present

## 2022-05-24 DIAGNOSIS — H04123 Dry eye syndrome of bilateral lacrimal glands: Secondary | ICD-10-CM | POA: Diagnosis not present

## 2022-06-28 ENCOUNTER — Telehealth: Payer: Self-pay

## 2022-06-28 NOTE — Telephone Encounter (Signed)
Attempted to contact the patient in regards to rescheduling with another provider, LVM for a call back. 

## 2022-07-28 DIAGNOSIS — C641 Malignant neoplasm of right kidney, except renal pelvis: Secondary | ICD-10-CM | POA: Diagnosis not present

## 2022-08-10 DIAGNOSIS — M1710 Unilateral primary osteoarthritis, unspecified knee: Secondary | ICD-10-CM | POA: Diagnosis not present

## 2022-08-10 DIAGNOSIS — Z905 Acquired absence of kidney: Secondary | ICD-10-CM | POA: Diagnosis not present

## 2022-08-10 DIAGNOSIS — I10 Essential (primary) hypertension: Secondary | ICD-10-CM | POA: Diagnosis not present

## 2022-08-10 DIAGNOSIS — H25811 Combined forms of age-related cataract, right eye: Secondary | ICD-10-CM | POA: Diagnosis not present

## 2022-08-10 DIAGNOSIS — Z79899 Other long term (current) drug therapy: Secondary | ICD-10-CM | POA: Diagnosis not present

## 2022-08-10 DIAGNOSIS — H40023 Open angle with borderline findings, high risk, bilateral: Secondary | ICD-10-CM | POA: Diagnosis not present

## 2022-08-10 DIAGNOSIS — K219 Gastro-esophageal reflux disease without esophagitis: Secondary | ICD-10-CM | POA: Diagnosis not present

## 2022-08-10 DIAGNOSIS — Z85528 Personal history of other malignant neoplasm of kidney: Secondary | ICD-10-CM | POA: Diagnosis not present

## 2022-08-10 DIAGNOSIS — Z961 Presence of intraocular lens: Secondary | ICD-10-CM | POA: Diagnosis not present

## 2022-08-10 DIAGNOSIS — H25812 Combined forms of age-related cataract, left eye: Secondary | ICD-10-CM | POA: Diagnosis not present

## 2022-08-10 DIAGNOSIS — H52221 Regular astigmatism, right eye: Secondary | ICD-10-CM | POA: Diagnosis not present

## 2022-08-10 DIAGNOSIS — H40021 Open angle with borderline findings, high risk, right eye: Secondary | ICD-10-CM | POA: Diagnosis not present

## 2022-08-10 DIAGNOSIS — Z880 Allergy status to penicillin: Secondary | ICD-10-CM | POA: Diagnosis not present

## 2022-08-10 DIAGNOSIS — H40001 Preglaucoma, unspecified, right eye: Secondary | ICD-10-CM | POA: Diagnosis not present

## 2022-08-17 ENCOUNTER — Other Ambulatory Visit: Payer: Self-pay | Admitting: Family Medicine

## 2022-08-17 ENCOUNTER — Other Ambulatory Visit: Payer: Self-pay | Admitting: Gastroenterology

## 2022-08-17 DIAGNOSIS — I10 Essential (primary) hypertension: Secondary | ICD-10-CM

## 2022-08-31 NOTE — Telephone Encounter (Signed)
Patient wanted appointment for after the first of January, 2024, so scheduled for 12/21/2022 at 9:20 with Dr. Cruzita Lederer.

## 2022-09-07 DIAGNOSIS — I10 Essential (primary) hypertension: Secondary | ICD-10-CM | POA: Diagnosis not present

## 2022-09-07 DIAGNOSIS — K219 Gastro-esophageal reflux disease without esophagitis: Secondary | ICD-10-CM | POA: Diagnosis not present

## 2022-09-07 DIAGNOSIS — H25812 Combined forms of age-related cataract, left eye: Secondary | ICD-10-CM | POA: Diagnosis not present

## 2022-09-07 DIAGNOSIS — H04123 Dry eye syndrome of bilateral lacrimal glands: Secondary | ICD-10-CM | POA: Diagnosis not present

## 2022-09-07 DIAGNOSIS — Z9841 Cataract extraction status, right eye: Secondary | ICD-10-CM | POA: Diagnosis not present

## 2022-09-07 DIAGNOSIS — H52223 Regular astigmatism, bilateral: Secondary | ICD-10-CM | POA: Diagnosis not present

## 2022-09-07 DIAGNOSIS — E669 Obesity, unspecified: Secondary | ICD-10-CM | POA: Diagnosis not present

## 2022-09-07 DIAGNOSIS — H40003 Preglaucoma, unspecified, bilateral: Secondary | ICD-10-CM | POA: Diagnosis not present

## 2022-09-07 DIAGNOSIS — H401120 Primary open-angle glaucoma, left eye, stage unspecified: Secondary | ICD-10-CM | POA: Diagnosis not present

## 2022-09-07 DIAGNOSIS — H40022 Open angle with borderline findings, high risk, left eye: Secondary | ICD-10-CM | POA: Diagnosis not present

## 2022-09-07 DIAGNOSIS — Z961 Presence of intraocular lens: Secondary | ICD-10-CM | POA: Diagnosis not present

## 2022-09-07 DIAGNOSIS — H40023 Open angle with borderline findings, high risk, bilateral: Secondary | ICD-10-CM | POA: Diagnosis not present

## 2022-09-14 DIAGNOSIS — Z961 Presence of intraocular lens: Secondary | ICD-10-CM | POA: Diagnosis not present

## 2022-09-14 DIAGNOSIS — H40023 Open angle with borderline findings, high risk, bilateral: Secondary | ICD-10-CM | POA: Diagnosis not present

## 2022-09-21 DIAGNOSIS — Z961 Presence of intraocular lens: Secondary | ICD-10-CM | POA: Diagnosis not present

## 2022-09-21 DIAGNOSIS — H40023 Open angle with borderline findings, high risk, bilateral: Secondary | ICD-10-CM | POA: Diagnosis not present

## 2022-09-21 DIAGNOSIS — H209 Unspecified iridocyclitis: Secondary | ICD-10-CM | POA: Diagnosis not present

## 2022-09-26 ENCOUNTER — Other Ambulatory Visit: Payer: Self-pay | Admitting: Gastroenterology

## 2022-10-18 DIAGNOSIS — Z961 Presence of intraocular lens: Secondary | ICD-10-CM | POA: Diagnosis not present

## 2022-10-18 DIAGNOSIS — H26493 Other secondary cataract, bilateral: Secondary | ICD-10-CM | POA: Diagnosis not present

## 2022-10-18 DIAGNOSIS — H40023 Open angle with borderline findings, high risk, bilateral: Secondary | ICD-10-CM | POA: Diagnosis not present

## 2022-10-18 DIAGNOSIS — H35373 Puckering of macula, bilateral: Secondary | ICD-10-CM | POA: Diagnosis not present

## 2022-10-20 ENCOUNTER — Other Ambulatory Visit: Payer: Self-pay | Admitting: Gastroenterology

## 2022-11-13 ENCOUNTER — Other Ambulatory Visit: Payer: Self-pay | Admitting: Family Medicine

## 2022-11-13 DIAGNOSIS — I10 Essential (primary) hypertension: Secondary | ICD-10-CM

## 2022-11-14 ENCOUNTER — Other Ambulatory Visit: Payer: Self-pay

## 2022-11-14 DIAGNOSIS — E042 Nontoxic multinodular goiter: Secondary | ICD-10-CM

## 2022-11-14 MED ORDER — METHIMAZOLE 5 MG PO TABS: 2.5000 mg | ORAL_TABLET | Freq: Every day | ORAL | 2 refills | Status: DC

## 2022-12-21 ENCOUNTER — Encounter: Payer: Self-pay | Admitting: Internal Medicine

## 2022-12-21 ENCOUNTER — Ambulatory Visit (INDEPENDENT_AMBULATORY_CARE_PROVIDER_SITE_OTHER): Payer: Medicare HMO | Admitting: Internal Medicine

## 2022-12-21 VITALS — BP 120/78 | HR 59 | Ht 62.0 in | Wt 194.4 lb

## 2022-12-21 DIAGNOSIS — E0590 Thyrotoxicosis, unspecified without thyrotoxic crisis or storm: Secondary | ICD-10-CM | POA: Diagnosis not present

## 2022-12-21 DIAGNOSIS — E042 Nontoxic multinodular goiter: Secondary | ICD-10-CM | POA: Diagnosis not present

## 2022-12-21 LAB — T4, FREE: Free T4: 0.92 ng/dL (ref 0.60–1.60)

## 2022-12-21 LAB — T3, FREE: T3, Free: 3.2 pg/mL (ref 2.3–4.2)

## 2022-12-21 LAB — TSH: TSH: 2.44 u[IU]/mL (ref 0.35–5.50)

## 2022-12-21 NOTE — Progress Notes (Addendum)
Patient ID: Cynthia Berger, female   DOB: 1956-03-08, 67 y.o.   MRN: [id number]  HPI  Cynthia Berger is a 67 y.o.-year-old female, returning for follow-up for thyrotoxicosis and multinodular goiter.  She previously saw Dr. Loanne Drilling, last visit 11 months ago.  I reviewed pt's thyroid tests: Lab Results  Component Value Date   TSH 2.05 01/12/2022   TSH 0.15 (L) 01/11/2021   TSH 0.99 01/10/2020   TSH 0.91 01/09/2019   TSH 0.63 10/23/2017   TSH 0.89 02/08/2016   TSH 0.57 02/27/2015   TSH 0.91 06/09/2014   TSH 0.45 05/10/2013   TSH 0.88 03/07/2012   FREET4 0.74 01/12/2022   FREET4 0.87 01/11/2021   FREET4 0.88 01/09/2019   FREET4 0.82 06/09/2014   FREET4 0.94 05/10/2013   FREET4 0.90 03/07/2012   Antithyroid antibodies: No results found for: "TSI"  Reviewed previous imaging and biopsy results: Thyroid U/S (03/09/2012): Right thyroid lobe:  5.7 x 2.1 x 3.1 cm.  Left thyroid lobe:  5.1 x 1.8 x 1.9 cm.  Isthmus:  0.5 cm.   Focal nodules:   There is a 2.9 x 1.2 x 2.5 cm inhomogeneous primarily solid nodule in the midportion of the right lobe.   There is a 1.1 x 0.6 ounces 0.6 cm nodule in the right upper pole.   There is a 0.9 x 0.7 x 1.0 cm solid nodule in the right lower pole.   On the left there is a 0.9 x 0.4 x 0.8 cm solid nodule in the upper pole.   There is a 0.8 x 0.6 x 0.7 cm solid nodule in the left lower pole.   Lymphadenopathy: None visualized.   IMPRESSION:  Multi nodular goiter.  Dominant 2.9 x 1.2 x 2.5 cm nodule in the  midportion of the right lobe fits national criteria for fine needle  aspiration biopsy.   FNA of the R thyroid nodule (03/20/2012): Adequacy Reason Satisfactory For Evaluation. Diagnosis THYROID, FINE NEEDLE ASPIRATION, RIGHT: BENIGN THYROID NODULE. Specimen Clinical Information 2.9 x 1.2 x 2.5cm dominant solid nodule mid right lobe thyroid Source Thyroid, Fine Needle Aspiration, Right  ... Serial U/S : 2014, 2017, 2020...  Thyroid U/S  (02/01/2020): Parenchymal Echotexture: Mildly heterogenous Isthmus: Normal in size measures 0.5 cm in diameter  Right lobe: Borderline enlarged measuring 5.6 x 2.4 x 2.6 cm, previously, 5.8 x 2.9 x 2.4 cm Left lobe: Normal in size measuring 5.4 x 1.8 x 1.8 cm, previously, 5.2 x 1.8 x 2.1 cm  _________________________________________________________   Estimated total number of nodules >/= 1 cm: 5 _________________________________________________________   The approximately 1.0 x 0.7 x 0.5 cm hypoechoic nodule within the superior pole the right lobe of the thyroid (labeled 1) is unchanged compared to the 09/2013 examination. Stability for greater than 5 years is indicative benign etiology.   The previously biopsied approximately 3.2 x 2.1 x 1.1 cm hypoechoic nodule within mid aspect the right lobe of the thyroid (labeled 2), is unchanged compared to the 09/2013 examination, previously, 3.1 x 2.2 x 1.3 cm. Stability for greater than 5 years is indicative of benign etiology.     The approximately 2.0 x 1.6 x 1.3 cm partially cystic though predominantly solid nodule within the inferior pole the right lobe of the thyroid (labeled 3), has increased in size compared to the 09/2013 examination, previously, 1.2 x 0.9 x 0.8 cm though size differences are attributable to interval partial cystic degeneration, a typically benign finding.   _________________________________________________________   Nodule # 4:  Prior biopsy: No  Location: Left; Superior  Maximum size: 1.8 x 1.6 x 0.8 cm, previously, 1.6 x 1.4 x 0.8 cm when compared to the 01/2019 examination, previously, 1.2 x 1.0 x 0.6 cm when compared to the 01/2016 examination. Composition: solid/almost completely solid (2)  Echogenicity: isoechoic (1) Significant change in size (>/= 20% in two dimensions and minimal increase of 2 mm): Yes *Given size (>/= 1.5 - 2.4 cm) and appearance, a follow-up ultrasound in 1 year should be  considered based on TI-RADS criteria.   _________________________________________________________   The approximately 0.9 cm ill-defined nodule/pseudonodule within mid aspect of the left lobe of the thyroid (labeled 5), is unchanged compared to the 09/2013 examination previously, 0.8 cm, and again does not meet imaging criteria to recommend percutaneous sampling or continued dedicated follow-up.     IMPRESSION: 1. Similar findings of multinodular goiter. 2. Continued increase in size of now 1.9 cm ill-defined isoechoic nodule within the superior pole of the left lobe of the thyroid (labeled #4), which again meets imaging criteria to recommend a 1 year follow-up. 3. Unchanged appearance of previously biopsied dominant nodule within the right lobe of the thyroid (labeled #3). Assuming a benign pathologic diagnosis, repeat sampling and/or continued dedicated follow-up is not recommended.  Pt denies: - feeling nodules in neck - hoarseness - dysphagia (except occasionally, with pills) - choking - SOB with lying down  She denies: - fatigue - excessive sweating/heat intolerance - tremors - anxiety - palpitations - hyperdefecation - hair loss  She lost 10 lbs in last 9 mo - reduced portion.  Pt does have a FH of thyroid ds. In her mother (goiter  - tx with RAI tx). No FH of thyroid cancer. No h/o radiation tx to head or neck. No seaweed or kelp, no recent contrast studies. No steroid use. No herbal supplements. No Biotin use. She is MVI and vitamin D.  Pt. also has a history of HTN, kidney cancer, esophageal dysmotility, GERD, hiatal hernia, DDD, vitamin D deficiency.  ROS: + see HPI  Past Medical History:  Diagnosis Date   AC (acromioclavicular) joint bone spurs    HEEL BONE SPURS-SURGERY 12/2010 LEFT FOOT   Antral gastritis    Cancer of kidney (HCC)    Closed fracture of fifth toe of left foot with malunion    right   DDD (degenerative disc disease), cervical     Esophageal dysmotility    GERD (gastroesophageal reflux disease)    Heart palpitations    Hiatal hernia    Hypertension    Hyperthyroidism    LGSIL (low grade squamous intraepithelial dysplasia) 2013    C&B Inadequate ECC--positive LGSIL --   Multinodular goiter    RIGHT LOBE-- PER BX NEGATIVE   Positive H. pylori test    Thyroid nodule    Vitamin D deficiency 11/2007   LOW AT 18   Past Surgical History:  Procedure Laterality Date   ABDOMINAL HYSTERECTOMY N/A 05/28/2013   Procedure: HYSTERECTOMY ABDOMINAL;  Surgeon: Anastasio Auerbach, MD;  Location: [facility];  Service: Gynecology;  Laterality: N/A;   BIOPSY THYROID Right 2014   ENLARGED THYROID NO MEDS BENIGN   CERVICAL BIOPSY  W/ LOOP ELECTRODE EXCISION  10/2012   for persistent low-grade dysplasia   CESAREAN SECTION  1993   W/ TUBAL LIGATION, BILATERAL   CYSTOSCOPY N/A 05/28/2013   Procedure: CYSTOSCOPY;  Surgeon: Anastasio Auerbach, MD;  Location: [facility];  Service: Gynecology;  Laterality: N/A;   esophogeal dilatation  FOOT SURGERY  2012   left heel spur removed   HYSTEROSCOPY WITH D & C  06/08/2012   Procedure: DILATATION AND CURETTAGE /HYSTEROSCOPY;  Surgeon: Anastasio Auerbach, MD;  Location: [facility];  Service: Gynecology;  Laterality: N/A;  WITH RESECTION OF MYOMA   NEPHRECTOMY  2005   RIGHT KIDNEY CANCER   ORIF TOE FRACTURE Right 08/15/2018   Procedure: RIGHT FIFTH TOE OSTEOTOMY AND PINNING;  Surgeon: Marybelle Killings, MD;  Location: [facility];  Service: Orthopedics;  Laterality: Right;   ROTATOR CUFF REPAIR  2010   RIGHT   s/p Right nephrectomy Right    SALPINGOOPHORECTOMY Bilateral 05/28/2013   Procedure: BILATERAL SALPINGO OOPHORECTOMY;  Surgeon: Anastasio Auerbach, MD;  Location: [facility];  Service: Gynecology;  Laterality: Bilateral;   TOE SURGERY  2020   baby toe on right foot   TRANSTHORACIC ECHOCARDIOGRAM  04-10-2012  DR BRIEN CRENSHAW   LVSF MILDLY REDUCED/ EF 45-50%   TUBAL  LIGATION     Social History   Socioeconomic History   Marital status: Married    Spouse name: Not on file   Number of children: 4   Years of education: Not on file   Highest education level: Not on file  Occupational History   Occupation: [employer]    Employer: [employer]  Tobacco Use   Smoking status: Never   Smokeless tobacco: Never  Vaping Use   Vaping Use: Never used  Substance and Sexual Activity   Alcohol use: Yes    Comment: occasional wine   Drug use: No   Sexual activity: Yes    Partners: Male    Birth control/protection: Post-menopausal    Comment: INTERCOURSE AGE 25, SEXUAL PARTNERS LESS THAN 5  Other Topics Concern   Not on file  Social History Narrative   Not on file   Social Determinants of Health   Financial Resource Strain: Not on file  Food Insecurity: Not on file  Transportation Needs: Not on file  Physical Activity: Not on file  Stress: Not on file  Social Connections: Not on file  Intimate Partner Violence: Not on file   Current Outpatient Medications on File Prior to Visit  Medication Sig Dispense Refill   amLODipine (NORVASC) 5 MG tablet TAKE 1 TABLET BY MOUTH EVERY DAY 90 tablet 3   cholecalciferol (VITAMIN D) 1000 UNITS tablet Take 1,000 Units by mouth daily.     famotidine (PEPCID) 20 MG tablet Take 1 tablet (20 mg total) by mouth 2 (two) times daily. Please schedule a follow up for further refills. Thank you 60 tablet 1   methimazole (TAPAZOLE) 5 MG tablet Take 0.5 tablets (2.5 mg total) by mouth daily. 45 tablet 2   metoprolol succinate (TOPROL-XL) 25 MG 24 hr tablet TAKE 1 TABLET BY MOUTH EVERY DAY 90 tablet 2   Multiple Vitamins-Minerals (MULTIVITAMIN PO) Take 1 tablet by mouth daily.      OVER THE COUNTER MEDICATION Probiotic capsule. One capsule daily.     No current facility-administered medications on file prior to visit.   Allergies  Allergen Reactions   Penicillins Rash   Family History  Problem Relation Age of  Onset   Hypertension Mother    Hypertension Sister    Hypertension Son    Colon cancer Neg Hx    Esophageal cancer Neg Hx    Rectal cancer Neg Hx    Stomach cancer Neg Hx    Breast cancer Neg Hx  PE: BP 120/78 (BP Location: Right Arm, Patient Position: Sitting, Cuff Size: Normal)   Pulse (!) 59   Ht _0  (1.575 m)   Wt 194 lb 6.4 oz (88.2 kg)   LMP 08/23/2006   SpO2 99%   BMI 35.56 kg/m  Wt Readings from Last 3 Encounters:  12/21/22 194 lb 6.4 oz (88.2 kg)  04/15/22 203 lb 6 oz (92.3 kg)  02/01/22 202 lb (91.6 kg)   Constitutional: overweight, in NAD Eyes: EOMI, no exophthalmos, no lid lag, no stare ENT: + R thyromegaly, no cervical lymphadenopathy Cardiovascular: RRR, No MRG Respiratory: CTA B Musculoskeletal: no deformities Skin: no rashes Neurological: no tremor with outstretched hands  ASSESSMENT: 1. Thyrotoxicosis  2.  Multinodular goiter  PLAN:  1. Patient with a h/o low TSH, without thyrotoxic sxs: no previous weight loss, heat intolerance, hyperdefecation, palpitations, anxiety. She has had intentional weight loss of almost 10 lbs in last 9 mo. - she does not appear to have exogenous causes for the low TSH.  - We discussed that possible causes of thyrotoxicosis are:  Graves ds   Thyroiditis toxic multinodular goiter/ toxic adenoma  - will check the TSH, fT3 and fT4 and also add thyroid stimulating antibodies to screen for Graves' disease.  -For now, she continues on methimazole low-dose, 2.5 mg daily.  She is also on Toprol-XL 25 mg daily (however, this is used for HTN treatment). - we discussed about possible modalities of treatment for the above conditions, to include continuing methimazole use, radioactive iodine ablation or (last resort) surgery.  Since she is responding well to low-dose methimazole, we can continue the thionamide for now, but we discussed about possibly trying to stop the medication after the results come back and repeat the test in 4 to  5 weeks afterwards. - at today's visit, she is not tachycardic or tremulous -on the beta-blocker - no signs of active Graves' ophthalmopathy: she does not have any double vision, blurry vision, eye pain, chemosis.  I am going to check her TSI antibodies today to screen for Graves' disease.  We discussed that if this is present, it is very mild. - RTC in 6 months, but possibly sooner for repeat labs  2.  Multinodular goiter -dx'ed 10 years ago -She denies neck compression symptoms except for occasional dysphagia with pills.  She does have esophageal dysmotility and hiatal hernia along with GERD, which can all contribute.  Of note, the left thyroid nodules are rather small and less likely to compress the esophagus. -she had several ultrasounds in the last 10 years showing slight increase in size of her dominant nodules, possibly due to accumulation of colloid.  The right dominant nodule was biopsied in 2013 with benign results. -Latest ultrasound is from 2021.  Only one of the nodules met criteria for follow-up -We will order another ultrasound now  - Total time spent for the visit: 40 min, in precharting, reviewing Dr. Cordelia Pen last note, obtaining medical information from the chart and from the pt, reviewing her  previous labs, images, evaluations, and treatments, reviewing her symptoms, counseling her about her thyroid conditions (please see the discussed topics above), and developing a plan to further investigate and treat them.  Component     Latest Ref Rng 12/21/2022  T4,Free(Direct)     0.60 - 1.60 ng/dL 0.92   TSH     0.35 - 5.50 uIU/mL 2.44   Triiodothyronine,Free,Serum     2.3 - 4.2 pg/mL 3.2   TSI     <  140 % baseline <89    Thyroid function tests and her Graves' antibodies are normal.  Will go ahead and try to stop the methimazole and repeat the test in 1.5 months.  Thyroid U/S (01/09/2023): Parenchymal Echotexture: Mildly heterogeneous  Isthmus: 0.6 cm  Right lobe: 5.4 x 2.3 x  2.9 cm  Left lobe: 5.1 x 1.9 x 2.0 cm  _________________________________________________________   Estimated total number of nodules >/= 1 cm: 5 _________________________________________________________   Nodule 1: 1.2 x 0.9 x 0.6 cm solid hypoechoic right superior thyroid nodule is not significantly changed in size since 02/11/2016 where it measured 0.9 x 0.5 x 0.7 cm, which is consistent with a benign etiology.  _________________________________________________________   Nodule 2: 0.6 cm cystic nodule in the superior right thyroid lobe does not meet criteria for imaging surveillance or FNA.  _________________________________________________________   Nodule 3: 3.1 x 2.5 x 0.8 cm solid isoechoic right mid thyroid nodule is unchanged in size since 02/11/2016 which indicates a benign etiology. Please correlate with prior FNA results from 03/20/2012.  _________________________________________________________   Nodule # 4:  Prior biopsy: No  Location: Right; inferior  Maximum size: 2.7 cm; Other 2 dimensions: 2.3 x 2.1 cm, previously, 2.0 x 1.9 x 1.3 cm  Composition: solid/almost completely solid (2)  Echogenicity: isoechoic (1)  Significant change in size (>/= 20% in two dimensions and minimal increase of 2 mm): Yes  Change in features: No  Change in ACR TI-RADS risk category: No  **Given size (>/= 2.5 cm) and appearance, fine needle aspiration of this mildly suspicious nodule should be considered based on TI-RADS criteria.  _________________________________________________________   Nodule # 5:  Prior biopsy: No  Location: Left; superior  Maximum size: 2.1 cm; Other 2 dimensions: 2.1 x 1.0 cm, previously, 1.9 x 1.6 x 0.8 cm on 01/31/2020 and 1.2 x 0.6 x 1.1 cm on 02/11/2016.  Composition: solid/almost completely solid (2)  Echogenicity: isoechoic (1) *Given size (>/= 1.5 - 2.4 cm) and appearance, a follow-up ultrasound in 1 year should be considered based on TI-RADS  criteria. _________________________________________________________   Remaining subcentimeter left thyroid nodules do not meet criteria for FNA or imaging follow-up.   IMPRESSION: 1. Nodule 4 (TI-RADS 3), located in the inferior right thyroid lobe, measuring 2.7 x 2.3 x 2.1 cm, demonstrates interval growth and now meets criteria for FNA. 2. Nodule 5 (TI-RADS 3), located in the superior left thyroid lobe, measuring 2.1 x 2.1 x 1.0 cm is not significantly changed in size since prior examination from 02/08/2020 and still meets criteria for imaging follow-up. Annual ultrasound surveillance is recommended until 5 years of stability is documented.  The right inferior isoechoic thyroid nodule appears to be larger than before, now >2.5 cm.  Will suggest biopsy. The left superior thyroid nodule is also slightly larger but this is isoechoic and smaller than 2.5 cm so he does not qualify for biopsy yet.  FNA (02/01/2023): Clinical History: Right inferior 2.7cm; Other 2 dimensions: 2.3 x 2.1cm, previously, 2.0 x 1.9 x 1.3cm, Solid / almost completely solid, Isoechoic, TI-RADS total points 3 Specimen Submitted:  A. THYROID, RIGHT INFERIOR, FINE NEEDLE ASPIRATION:   FINAL MICROSCOPIC DIAGNOSIS: - Consistent with benign follicular nodule (Bethesda category II)  SPECIMEN ADEQUACY: Satisfactory for evaluation   Philemon Kingdom, MD PhD [facility]

## 2022-12-21 NOTE — Patient Instructions (Signed)
For now, continue Methimazole 2.5 mg daily.  Please stop at the lab.  You should have an endocrinology follow-up appointment in 1 year.

## 2022-12-23 LAB — THYROID STIMULATING IMMUNOGLOBULIN: TSI: <89 % baseline (ref <140)

## 2023-01-09 ENCOUNTER — Ambulatory Visit
Admission: RE | Admit: 2023-01-09 | Discharge: 2023-01-09 | Disposition: A | Discharge status: Home / Self Care | Payer: Medicare HMO | Source: Ambulatory Visit | Attending: Internal Medicine | Admitting: Internal Medicine

## 2023-01-09 DIAGNOSIS — E042 Nontoxic multinodular goiter: Secondary | ICD-10-CM | POA: Diagnosis not present

## 2023-01-11 ENCOUNTER — Encounter: Payer: Self-pay | Admitting: Internal Medicine

## 2023-01-12 DIAGNOSIS — H524 Presbyopia: Secondary | ICD-10-CM | POA: Diagnosis not present

## 2023-01-12 DIAGNOSIS — H52222 Regular astigmatism, left eye: Secondary | ICD-10-CM | POA: Diagnosis not present

## 2023-01-13 ENCOUNTER — Other Ambulatory Visit: Payer: Self-pay | Admitting: Internal Medicine

## 2023-01-13 DIAGNOSIS — E042 Nontoxic multinodular goiter: Secondary | ICD-10-CM

## 2023-01-19 ENCOUNTER — Ambulatory Visit: Payer: Medicare HMO | Admitting: Gastroenterology

## 2023-01-19 ENCOUNTER — Encounter: Payer: Self-pay | Admitting: Gastroenterology

## 2023-01-19 VITALS — BP 130/76 | HR 59 | Ht 62.0 in | Wt 190.0 lb

## 2023-01-19 DIAGNOSIS — R09A2 Foreign body sensation, throat: Secondary | ICD-10-CM

## 2023-01-19 DIAGNOSIS — K219 Gastro-esophageal reflux disease without esophagitis: Secondary | ICD-10-CM | POA: Diagnosis not present

## 2023-01-19 NOTE — Patient Instructions (Signed)
You have been scheduled for an endoscopy. Please follow written instructions given to you at your visit today. If you use inhalers (even only as needed), please bring them with you on the day of your procedure.  _______________________________________________________  If your blood pressure at your visit was 140/90 or greater, please contact your primary care physician to follow up on this.  _______________________________________________________  If you are age 66 or older, your body mass index should be between 23-30. Your Body mass index is 34.75 kg/m. If this is out of the aforementioned range listed, please consider follow up with your Primary Care Provider.  If you are age 20 or younger, your body mass index should be between 19-25. Your Body mass index is 34.75 kg/m. If this is out of the aformentioned range listed, please consider follow up with your Primary Care Provider.   ________________________________________________________  The [facility] providers would like to encourage you to use [facility] to communicate with providers for non-urgent requests or questions.  Due to long hold times on the telephone, sending your provider a message by [facility] may be a faster and more efficient way to get a response.  Please allow 48 business hours for a response.  Please remember that this is for non-urgent requests.  _______________________________________________________

## 2023-01-19 NOTE — Progress Notes (Signed)
01/19/2023 Cynthia Berger [id number] [birth year]   HISTORY OF PRESENT ILLNESS: This is a pleasant 67 year old female who is a patient of Dr. Doyne Keel.  She follows here with issues in regards to acid reflux.  She was on PPI in the past, but that had been stopped due to only having 1 kidney and wanting to preserve its function.  She has prescribed Pepcid 20 mg twice daily.  She says that she takes morning dose regularly, but does not always use the evening dose.  She says that she uses that more so only as needed.  She has been having a lot of upper abdominal burning and globus sensation recently.  No dysphagia.  I had seen her back in March 2022 for some issues with diarrhea.  She says that that is pretty much straightened out and are much more regular now except for occasional diarrhea may be related to something she eats.   Past Medical History:  Diagnosis Date   AC (acromioclavicular) joint bone spurs    HEEL BONE SPURS-SURGERY 12/2010 LEFT FOOT   Antral gastritis    Cancer of kidney (HCC)    Closed fracture of fifth toe of left foot with malunion    right   DDD (degenerative disc disease), cervical    Esophageal dysmotility    GERD (gastroesophageal reflux disease)    Heart palpitations    Hiatal hernia    Hypertension    Hyperthyroidism    LGSIL (low grade squamous intraepithelial dysplasia) 2013    C&B Inadequate ECC--positive LGSIL --   Multinodular goiter    RIGHT LOBE-- PER BX NEGATIVE   Positive H. pylori test    Thyroid nodule    Vitamin D deficiency 11/2007   LOW AT 18   Past Surgical History:  Procedure Laterality Date   ABDOMINAL HYSTERECTOMY N/A 05/28/2013   Procedure: HYSTERECTOMY ABDOMINAL;  Surgeon: Anastasio Auerbach, MD;  Location: [facility];  Service: Gynecology;  Laterality: N/A;   BIOPSY THYROID Right 2014   ENLARGED THYROID NO MEDS BENIGN   CERVICAL BIOPSY  W/ LOOP ELECTRODE EXCISION  10/2012   for persistent low-grade dysplasia   CESAREAN SECTION   1993   W/ TUBAL LIGATION, BILATERAL   CYSTOSCOPY N/A 05/28/2013   Procedure: CYSTOSCOPY;  Surgeon: Anastasio Auerbach, MD;  Location: [facility];  Service: Gynecology;  Laterality: N/A;   esophogeal dilatation     FOOT SURGERY  2012   left heel spur removed   HYSTEROSCOPY WITH D & C  06/08/2012   Procedure: DILATATION AND CURETTAGE /HYSTEROSCOPY;  Surgeon: Anastasio Auerbach, MD;  Location: [facility];  Service: Gynecology;  Laterality: N/A;  WITH RESECTION OF MYOMA   NEPHRECTOMY  2005   RIGHT KIDNEY CANCER   ORIF TOE FRACTURE Right 08/15/2018   Procedure: RIGHT FIFTH TOE OSTEOTOMY AND PINNING;  Surgeon: Marybelle Killings, MD;  Location: [facility];  Service: Orthopedics;  Laterality: Right;   ROTATOR CUFF REPAIR  2010   RIGHT   s/p Right nephrectomy Right    SALPINGOOPHORECTOMY Bilateral 05/28/2013   Procedure: BILATERAL SALPINGO OOPHORECTOMY;  Surgeon: Anastasio Auerbach, MD;  Location: [facility];  Service: Gynecology;  Laterality: Bilateral;   TOE SURGERY  2020   baby toe on right foot   TRANSTHORACIC ECHOCARDIOGRAM  04-10-2012  DR BRIEN CRENSHAW   LVSF MILDLY REDUCED/ EF 45-50%   TUBAL LIGATION      reports that she has never smoked. She has never used  smokeless tobacco. She reports current alcohol use. She reports that she does not use drugs. family history includes Hypertension in her mother, sister, and son. Allergies  Allergen Reactions   Penicillins Rash      Outpatient Encounter Medications as of 01/19/2023  Medication Sig   amLODipine (NORVASC) 5 MG tablet TAKE 1 TABLET BY MOUTH EVERY DAY   cholecalciferol (VITAMIN D) 1000 UNITS tablet Take 1,000 Units by mouth daily.   famotidine (PEPCID) 20 MG tablet Take 1 tablet (20 mg total) by mouth 2 (two) times daily. Please schedule a follow up for further refills. Thank you   metoprolol succinate (TOPROL-XL) 25 MG 24 hr tablet TAKE 1 TABLET BY MOUTH EVERY DAY   Multiple Vitamins-Minerals (MULTIVITAMIN PO) Take  1 tablet by mouth daily.    OVER THE COUNTER MEDICATION Probiotic capsule. One capsule daily.   No facility-administered encounter medications on file as of 01/19/2023.     REVIEW OF SYSTEMS  : All other systems reviewed and negative except where noted in the History of Present Illness.   PHYSICAL EXAM: BP 130/76   Pulse (!) 59   Ht '5\' 2"'$  (1.575 m)   Wt 190 lb (86.2 kg)   LMP 08/23/2006   BMI 34.75 kg/m  General: Well developed female in no acute distress Head: Normocephalic and atraumatic Eyes:  Sclerae anicteric, conjunctiva pink. Ears: Normal auditory acuity Lungs: Clear throughout to auscultation; no W/R/R. Heart: Regular rate and rhythm; no M/R/G. Abdomen: Soft, non-distended.  BS present.  Mild epigastric TTP. Musculoskeletal: Symmetrical with no gross deformities  Skin: No lesions on visible extremities Extremities: No edema  Neurological: Alert oriented x 4, grossly non-focal Psychological:  Alert and cooperative. Normal mood and affect  ASSESSMENT AND PLAN: *GERD with globus sensation: Was taken off of her PPI since she only has one kidney.  Is using Pepcid regularly once a day, but does not take the evening dose regularly.  Advised that she may need to do so.  She would like to have a repeat endoscopy since the last was 2013.  Will schedule Dr. Havery Moros.  The risks, benefits, and alternatives to EGD were discussed with the patient and she consents to proceed.  CC:  Martin[place], Betty G, MD

## 2023-01-19 NOTE — Progress Notes (Signed)
Agree with assessment / plan as outlined.  

## 2023-01-20 ENCOUNTER — Telehealth: Payer: Self-pay | Admitting: *Deleted

## 2023-01-20 ENCOUNTER — Other Ambulatory Visit: Payer: Self-pay

## 2023-01-20 ENCOUNTER — Encounter: Payer: Medicare HMO | Admitting: Gastroenterology

## 2023-01-20 DIAGNOSIS — K219 Gastro-esophageal reflux disease without esophagitis: Secondary | ICD-10-CM

## 2023-01-20 DIAGNOSIS — R09A2 Foreign body sensation, throat: Secondary | ICD-10-CM

## 2023-01-20 NOTE — Telephone Encounter (Signed)
Ambulatory referral to GI in epic.

## 2023-01-20 NOTE — Telephone Encounter (Signed)
Called patient - explained her history of difficult airway that was detected reviewed on prior notes per anesthesia, and recommendations to have the procedure at the hospital for safety purposes. I went through this with her, she understands and agrees, she will be contacted for scheduling.   Brooklyn please see prior messages and can you assist with scheduling her at the hospital? Thanks

## 2023-01-20 NOTE — Telephone Encounter (Signed)
Dr. Havery Moros,  I just saw this at [phone] and this pt is scheduled with you for a EGD at 1530 today.  She is a documented difficult intubation and her procedure will need to be done at the hospital.  Thanks,  Osvaldo Angst

## 2023-01-20 NOTE — Telephone Encounter (Signed)
Great, thanks for your help with this 

## 2023-01-20 NOTE — Telephone Encounter (Signed)
John thanks for noticing this, patient was just added to the schedule yesterday. Jess, FYI.  [facility] staff can you please notify the patient that she should not come for EGD today due to difficult intubation in the past, her case needs to be done at the hospital for anesthesia support. Sorry we just caught this now.   Brooklyn can you please book this patient in the opening I have later in February at the hospital for EGD, if she is free that day? Thanks

## 2023-01-20 NOTE — Telephone Encounter (Signed)
Called patient to inform her after review of her chart by CRNA that due to a previously charted difficult intubation unfortunately she is not an appropriate candidate in an ambulatory setting.  Patient was informed that a nurse from [facility] was currently working on getting her on the endo schedule in a hospital setting and will be contacted when an appointment could be set up.

## 2023-01-20 NOTE — Telephone Encounter (Signed)
Called and spoke with patient to offer her an appt at [facility] on Monday, 02/13/23 at 10:15 am. Pt is aware that she will need to arrive by 8:45 am with a care partner. Pt does have access to MyChart and understands that I will send her updated instructions there. Pt verbalized understanding and had no concerns at the end of the call.   EGD instructions sent via MyChart.   Secure message sent to pre-certification team letting them know of location change.

## 2023-01-20 NOTE — Addendum Note (Signed)
Addended by: Yevette Edwards on: 01/20/2023 09:56 AM   Modules accepted: Orders

## 2023-01-25 ENCOUNTER — Other Ambulatory Visit (INDEPENDENT_AMBULATORY_CARE_PROVIDER_SITE_OTHER): Payer: Medicare HMO

## 2023-01-25 DIAGNOSIS — E0590 Thyrotoxicosis, unspecified without thyrotoxic crisis or storm: Secondary | ICD-10-CM | POA: Diagnosis not present

## 2023-01-25 LAB — T3, FREE: T3, Free: 3.1 pg/mL (ref 2.3–4.2)

## 2023-01-25 LAB — T4, FREE: Free T4: 0.9 ng/dL (ref 0.60–1.60)

## 2023-01-25 LAB — TSH: TSH: 1.11 u[IU]/mL (ref 0.35–5.50)

## 2023-01-26 DIAGNOSIS — C641 Malignant neoplasm of right kidney, except renal pelvis: Secondary | ICD-10-CM | POA: Diagnosis not present

## 2023-01-27 ENCOUNTER — Encounter: Payer: Self-pay | Admitting: Gastroenterology

## 2023-01-30 ENCOUNTER — Other Ambulatory Visit (HOSPITAL_COMMUNITY)
Admission: RE | Admit: 2023-01-30 | Discharge: 2023-01-30 | Disposition: A | Discharge status: Home / Self Care | Payer: Medicare HMO | Source: Ambulatory Visit | Attending: Internal Medicine | Admitting: Internal Medicine

## 2023-01-30 ENCOUNTER — Ambulatory Visit
Admission: RE | Admit: 2023-01-30 | Discharge: 2023-01-30 | Disposition: A | Discharge status: Home / Self Care | Payer: Medicare HMO | Source: Ambulatory Visit | Attending: Internal Medicine | Admitting: Internal Medicine

## 2023-01-30 DIAGNOSIS — E042 Nontoxic multinodular goiter: Secondary | ICD-10-CM

## 2023-01-30 DIAGNOSIS — E041 Nontoxic single thyroid nodule: Secondary | ICD-10-CM | POA: Diagnosis not present

## 2023-01-31 ENCOUNTER — Encounter: Payer: Self-pay | Admitting: Nurse Practitioner

## 2023-02-01 LAB — CYTOLOGY - NON PAP

## 2023-02-02 ENCOUNTER — Encounter: Payer: Self-pay | Admitting: Nurse Practitioner

## 2023-02-02 ENCOUNTER — Ambulatory Visit (INDEPENDENT_AMBULATORY_CARE_PROVIDER_SITE_OTHER): Payer: Medicare HMO | Admitting: Nurse Practitioner

## 2023-02-02 ENCOUNTER — Other Ambulatory Visit (HOSPITAL_COMMUNITY)
Admission: RE | Admit: 2023-02-02 | Discharge: 2023-02-02 | Disposition: A | Discharge status: Home / Self Care | Payer: Medicare HMO | Source: Ambulatory Visit | Attending: Nurse Practitioner | Admitting: Nurse Practitioner

## 2023-02-02 VITALS — BP 112/72 | HR 64 | Ht 62.25 in | Wt 188.0 lb

## 2023-02-02 DIAGNOSIS — Z780 Asymptomatic menopausal state: Secondary | ICD-10-CM

## 2023-02-02 DIAGNOSIS — Z01419 Encounter for gynecological examination (general) (routine) without abnormal findings: Secondary | ICD-10-CM | POA: Diagnosis not present

## 2023-02-02 DIAGNOSIS — Z1272 Encounter for screening for malignant neoplasm of vagina: Secondary | ICD-10-CM | POA: Diagnosis not present

## 2023-02-02 NOTE — Progress Notes (Signed)
   Roslynn Bielinski 1956-12-10 [id number]   History:  67 y.o. [id number] presents for breast and pelvic exam without GYN complaints. Postmenopausal - no HRT. 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 Normal mammogram history. 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 HTN, hypothyroidism managed by PCP. Benign thyroid nodule 01/30/23.  Gynecologic History Patient's last menstrual period was 08/23/2006.   Contraception: status post hysterectomy Sexually active: Yes  Health maintenance Last Pap: 12/23/2019. Results were: Normal Last mammogram: 03/04/2022. Results were: Possible right breast asymmetry, normal follow up imaging Last colonoscopy: 12/25/2017. Results were: Normal, 10-year recall Last Dexa: 01/09/2020. Results were: Normal, 5-year recall  Past medical history, past surgical history, family history and social history were all reviewed and documented in the EPIC chart. Married. 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 4 children, all live local.   ROS:  A ROS was performed and pertinent positives and negatives are included.  Exam:  Vitals:   02/02/23 1056  BP: 112/72  Pulse: 64  SpO2: 98%  Weight: 188 lb (85.3 kg)  Height: 5' 2.25" (1.581 m)     Body mass index is 34.11 kg/m.  General appearance:  Normal Thyroid:  Symmetrical, normal in size, without palpable masses or nodularity. Respiratory  Auscultation:  Clear without wheezing or rhonchi Cardiovascular  Auscultation:  Regular rate, without rubs, murmurs or gallops  Edema/varicosities:  Not grossly evident Abdominal  Soft,nontender, without masses, guarding or rebound.  Liver/spleen:  No organomegaly noted  Hernia:  None appreciated  Skin  Inspection:  Grossly normal   Breasts: Examined lying and sitting.   Right: Without masses, retractions, discharge or axillary adenopathy.   Left: Without masses, retractions, discharge or axillary adenopathy. Genitourinary   Inguinal/mons:  Normal  without inguinal adenopathy  External genitalia:  Normal appearing vulva with no masses, tenderness, or lesions  BUS/Urethra/Skene's glands:  Normal  Vagina:  Normal appearing with normal color and discharge, no lesions  Cervix:  Absent  Uterus: Absent  Adnexa/parametria:     Rt: Normal in size, without masses or tenderness.   Lt: Normal in size, without masses or tenderness.  Anus and perineum: Normal  Digital rectal exam: Deferred  Patient informed chaperone available to be present for breast and pelvic exam. Patient has requested no chaperone to be present. Patient has been advised what will be completed during breast and pelvic exam.   Assessment/Plan:  67 y.o. [id number] for breast and pelvic exam.   Well woman exam with routine gynecological exam - Education provided on SBEs, importance of preventative screenings, current guidelines, high calcium diet, regular exercise, and multivitamin daily. Labs with PCP.   Postmenopausal - no HRT. 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  Screening for cervical cancer - History of persistent CIN-1. Normal paps since hysterectomy. Vaginal pap today. We discussed if normal we can discontinue screenings and she is agreeable.   Screening for breast cancer - Normal mammogram history.  Continue annual screenings.  Normal breast exam today.   Screening for colon cancer - 2019 colonoscopy. Will repeat at GI's recommended interval.   Screening for osteoporosis - Normal bone density in 2021. Will plan to repeat DXA at 5-year interval per recommendation.   Follow up in 2 years for breast and pelvic exam.      Tamela Gammon [facility], 11:50 AM 02/02/2023

## 2023-02-03 ENCOUNTER — Encounter (HOSPITAL_COMMUNITY): Payer: Self-pay | Admitting: Gastroenterology

## 2023-02-06 LAB — CYTOLOGY - PAP: Diagnosis: NEGATIVE

## 2023-02-11 NOTE — Anesthesia Preprocedure Evaluation (Signed)
Anesthesia Evaluation  Patient identified by MRN, date of birth, ID band Patient awake    Reviewed: Allergy & Precautions, NPO status , Patient's Chart, lab work & pertinent test results  History of Anesthesia Complications (+) DIFFICULT AIRWAY and history of anesthetic complications (Glidescope needed for previous intubation)  Airway Mallampati: III  TM Distance: >3 FB Neck ROM: Full    Dental no notable dental hx.    Pulmonary neg pulmonary ROS   Pulmonary exam normal        Cardiovascular hypertension, Pt. on medications and Pt. on home beta blockers Normal cardiovascular exam     Neuro/Psych negative neurological ROS  negative psych ROS   GI/Hepatic Neg liver ROS, hiatal hernia,GERD  Medicated,,  Endo/Other    Renal/GU Renal InsufficiencyRenal diseaseSolitary kidney  negative genitourinary   Musculoskeletal  (+) Arthritis ,    Abdominal   Peds  Hematology negative hematology ROS (+)   Anesthesia Other Findings Day of surgery medications reviewed with patient.  Reproductive/Obstetrics negative OB ROS                             Anesthesia Physical Anesthesia Plan  ASA: 2  Anesthesia Plan: MAC   Post-op Pain Management: Minimal or no pain anticipated   Induction:   PONV Risk Score and Plan: 2 and Treatment may vary due to age or medical condition and Propofol infusion  Airway Management Planned: Natural Airway and Nasal Cannula  Additional Equipment: None  Intra-op Plan:   Post-operative Plan:   Informed Consent: I have reviewed the patients History and Physical, chart, labs and discussed the procedure including the risks, benefits and alternatives for the proposed anesthesia with the patient or authorized representative who has indicated his/her understanding and acceptance.       Plan Discussed with: CRNA  Anesthesia Plan Comments:         Anesthesia Quick  Evaluation

## 2023-02-13 ENCOUNTER — Ambulatory Visit (HOSPITAL_BASED_OUTPATIENT_CLINIC_OR_DEPARTMENT_OTHER): Payer: Medicare HMO | Admitting: Registered Nurse

## 2023-02-13 ENCOUNTER — Ambulatory Visit (HOSPITAL_COMMUNITY)
Admission: RE | Admit: 2023-02-13 | Discharge: 2023-02-13 | Disposition: A | Discharge status: Home / Self Care | Payer: Medicare HMO | Attending: Gastroenterology | Admitting: Gastroenterology

## 2023-02-13 ENCOUNTER — Ambulatory Visit (HOSPITAL_COMMUNITY): Payer: Medicare HMO | Admitting: Registered Nurse

## 2023-02-13 ENCOUNTER — Encounter (HOSPITAL_COMMUNITY)
Admission: RE | Disposition: A | Discharge status: Home / Self Care | Payer: Self-pay | Source: Home / Self Care | Attending: Gastroenterology

## 2023-02-13 ENCOUNTER — Encounter (HOSPITAL_COMMUNITY): Payer: Self-pay | Admitting: Gastroenterology

## 2023-02-13 ENCOUNTER — Other Ambulatory Visit: Payer: Self-pay

## 2023-02-13 DIAGNOSIS — K449 Diaphragmatic hernia without obstruction or gangrene: Secondary | ICD-10-CM | POA: Diagnosis not present

## 2023-02-13 DIAGNOSIS — Z8249 Family history of ischemic heart disease and other diseases of the circulatory system: Secondary | ICD-10-CM | POA: Diagnosis not present

## 2023-02-13 DIAGNOSIS — N289 Disorder of kidney and ureter, unspecified: Secondary | ICD-10-CM | POA: Diagnosis not present

## 2023-02-13 DIAGNOSIS — I10 Essential (primary) hypertension: Secondary | ICD-10-CM

## 2023-02-13 DIAGNOSIS — Z905 Acquired absence of kidney: Secondary | ICD-10-CM | POA: Diagnosis not present

## 2023-02-13 DIAGNOSIS — Z85528 Personal history of other malignant neoplasm of kidney: Secondary | ICD-10-CM | POA: Insufficient documentation

## 2023-02-13 DIAGNOSIS — K219 Gastro-esophageal reflux disease without esophagitis: Secondary | ICD-10-CM | POA: Diagnosis not present

## 2023-02-13 DIAGNOSIS — M1990 Unspecified osteoarthritis, unspecified site: Secondary | ICD-10-CM | POA: Diagnosis not present

## 2023-02-13 DIAGNOSIS — R09A2 Foreign body sensation, throat: Secondary | ICD-10-CM

## 2023-02-13 HISTORY — PX: ESOPHAGOGASTRODUODENOSCOPY (EGD) WITH PROPOFOL: SHX5813

## 2023-02-13 SURGERY — ESOPHAGOGASTRODUODENOSCOPY (EGD) WITH PROPOFOL
Anesthesia: Monitor Anesthesia Care

## 2023-02-13 MED ORDER — PROPOFOL 10 MG/ML IV BOLUS
INTRAVENOUS | Status: DC | PRN
  Administered 2023-02-13 (×3): 20 mg via INTRAVENOUS

## 2023-02-13 MED ORDER — LACTATED RINGERS IV SOLN: INTRAVENOUS | Status: DC | PRN

## 2023-02-13 MED ORDER — PROPOFOL 1000 MG/100ML IV EMUL
INTRAVENOUS | Status: AC
  Filled 2023-02-13: qty 100

## 2023-02-13 MED ORDER — SODIUM CHLORIDE 0.9 % IV SOLN: INTRAVENOUS | Status: DC

## 2023-02-13 MED ORDER — PROPOFOL 500 MG/50ML IV EMUL
INTRAVENOUS | Status: DC | PRN
  Administered 2023-02-13: 140 ug/kg/min via INTRAVENOUS

## 2023-02-13 SURGICAL SUPPLY — 15 items

## 2023-02-13 NOTE — Discharge Instructions (Signed)
YOU HAD AN ENDOSCOPIC PROCEDURE TODAY: Refer to the procedure report and other information in the discharge instructions given to you for any specific questions about what was found during the examination. If this information does not answer your questions, please call [facility] office at [phone] to clarify.   YOU SHOULD EXPECT: Some feelings of bloating in the abdomen. Passage of more gas than usual. Walking can help get rid of the air that was put into your GI tract during the procedure and reduce the bloating. If you had a lower endoscopy (such as a colonoscopy or flexible sigmoidoscopy) you may notice spotting of blood in your stool or on the toilet paper. Some abdominal soreness may be present for a day or two, also.  DIET: Your first meal following the procedure should be a light meal and then it is ok to progress to your normal diet. A half-sandwich or bowl of soup is an example of a good first meal. Heavy or fried foods are harder to digest and may make you feel nauseous or bloated. Drink plenty of fluids but you should avoid alcoholic beverages for 24 hours. If you had a esophageal dilation, please see attached instructions for diet.    ACTIVITY: Your care partner should take you home directly after the procedure. You should plan to take it easy, moving slowly for the rest of the day. You can resume normal activity the day after the procedure however YOU SHOULD NOT DRIVE, use power tools, machinery or perform tasks that involve climbing or major physical exertion for 24 hours (because of the sedation medicines used during the test).   SYMPTOMS TO REPORT IMMEDIATELY: A gastroenterologist can be reached at any hour. Please call [phone]  for any of the following symptoms:   Following upper endoscopy (EGD) Vomiting of blood or coffee ground material  New, significant abdominal pain  New, significant chest pain or pain under the shoulder blades  Painful or persistently difficult swallowing   New shortness of breath  Black, tarry-looking or red, bloody stools  FOLLOW UP:  If any biopsies were taken you will be contacted by phone or by letter within the next 1-3 weeks. Call [phone]  if you have not heard about the biopsies in 3 weeks.  Please also call with any specific questions about appointments or follow up tests.  

## 2023-02-13 NOTE — Anesthesia Procedure Notes (Signed)
Procedure Name: MAC Date/Time: 02/13/2023 9:11 AM  Performed by: Victoriano Lain, CRNAPre-anesthesia Checklist: Patient identified, Emergency Drugs available, Suction available and Patient being monitored Patient Re-evaluated:Patient Re-evaluated prior to induction Oxygen Delivery Method: Nasal cannula Placement Confirmation: positive ETCO2 Dental Injury: Teeth and Oropharynx as per pre-operative assessment  Comments: Using Opti-Flow

## 2023-02-13 NOTE — H&P (Signed)
[facility] History and Physical   Primary Care Physician:  Martinique, Betty G, MD   Reason for Procedure:   GERD  Plan:    EGD     HPI: Cynthia Berger is a 67 y.o. female  here for EGD to reassess history of GERD.  History of Renal cell carcinoma, has one kidney, now off PPI has been managing with pepcid one or twice daily however does have some breakthrough at times. Trying to avoid food triggers. Here for EGD to reassess, ensure no BE and need for chronic PPI. I have discussed risks / benefits of the exam and anesthesia and she wishes to proceed.  Otherwise feels well without any cardiopulmonary symptoms.   Her case is being done at the hospital for anesthesia support given reported history of having a difficult airway for intubation, was not cleared to have this done at the [facility].  I have discussed risks / benefits of anesthesia and endoscopic procedure with Kanita Bui and they wish to proceed with the exams as outlined today.    Past Medical History:  Diagnosis Date   AC (acromioclavicular) joint bone spurs    HEEL BONE SPURS-SURGERY 12/2010 LEFT FOOT   Antral gastritis    Cancer of kidney (HCC)    Closed fracture of fifth toe of left foot with malunion    right   DDD (degenerative disc disease), cervical    Difficult airway for intubation, initial encounter 05/28/2013   DL once by CRNA, MAC 3, no view of cords. DL twice by anesthesiologist MAC 3 no view of cords. ); Video Laryngoscope (Good view with glidescope. Cords were anterior)   Esophageal dysmotility    GERD (gastroesophageal reflux disease)    Heart palpitations    Hiatal hernia    Hypertension    Hyperthyroidism    LGSIL (low grade squamous intraepithelial dysplasia) 2013    C&B Inadequate ECC--positive LGSIL --   Multinodular goiter    RIGHT LOBE-- PER BX NEGATIVE   Positive H. pylori test    Thyroid nodule    Vitamin D deficiency 11/2007   LOW AT 18    Past Surgical History:  Procedure  Laterality Date   ABDOMINAL HYSTERECTOMY N/A 05/28/2013   Procedure: HYSTERECTOMY ABDOMINAL;  Surgeon: Anastasio Auerbach, MD;  Location: [facility];  Service: Gynecology;  Laterality: N/A;   BIOPSY THYROID Right 2014   ENLARGED THYROID NO MEDS BENIGN   CERVICAL BIOPSY  W/ LOOP ELECTRODE EXCISION  10/2012   for persistent low-grade dysplasia   CESAREAN SECTION  1993   W/ TUBAL LIGATION, BILATERAL   CYSTOSCOPY N/A 05/28/2013   Procedure: CYSTOSCOPY;  Surgeon: Anastasio Auerbach, MD;  Location: [facility];  Service: Gynecology;  Laterality: N/A;   esophogeal dilatation     FOOT SURGERY  2012   left heel spur removed   HYSTEROSCOPY WITH D & C  06/08/2012   Procedure: DILATATION AND CURETTAGE /HYSTEROSCOPY;  Surgeon: Anastasio Auerbach, MD;  Location: [facility];  Service: Gynecology;  Laterality: N/A;  WITH RESECTION OF MYOMA   NEPHRECTOMY  2005   RIGHT KIDNEY CANCER   ORIF TOE FRACTURE Right 08/15/2018   Procedure: RIGHT FIFTH TOE OSTEOTOMY AND PINNING;  Surgeon: Marybelle Killings, MD;  Location: [facility];  Service: Orthopedics;  Laterality: Right;   ROTATOR CUFF REPAIR  2010   RIGHT   s/p Right nephrectomy Right    SALPINGOOPHORECTOMY Bilateral 05/28/2013   Procedure: BILATERAL SALPINGO OOPHORECTOMY;  Surgeon: Christia Reading  Corey Skains, MD;  Location: [facility];  Service: Gynecology;  Laterality: Bilateral;   TOE SURGERY  2020   baby toe on right foot   TRANSTHORACIC ECHOCARDIOGRAM  04-10-2012  DR BRIEN CRENSHAW   LVSF MILDLY REDUCED/ EF 45-50%   TUBAL LIGATION      Prior to Admission medications   Medication Sig Start Date End Date Taking? Authorizing Provider  amLODipine (NORVASC) 5 MG tablet TAKE 1 TABLET BY MOUTH EVERY DAY 08/17/22  Yes Martinique, Betty G, MD  cholecalciferol (VITAMIN D) 1000 UNITS tablet Take 1,000 Units by mouth daily.   Yes [provider]  famotidine (PEPCID) 20 MG tablet Take 1 tablet (20 mg total) by mouth 2 (two) times daily. Please schedule a  follow up for further refills. Thank you 09/27/22  Yes Ermal Brzozowski, Carlota Raspberry, MD  metoprolol succinate (TOPROL-XL) 25 MG 24 hr tablet TAKE 1 TABLET BY MOUTH EVERY DAY 11/14/22  Yes Martinique, Betty G, MD  Multiple Vitamins-Minerals (MULTIVITAMIN PO) Take 1 tablet by mouth daily.    Yes [provider]    Current Facility-Administered Medications  Medication Dose Route Frequency Provider Last Rate Last Admin   0.9 %  sodium chloride infusion   Intravenous Continuous Zyara Riling, Carlota Raspberry, MD        Allergies as of 01/20/2023 - Review Complete 01/19/2023  Allergen Reaction Noted   Penicillins Rash 08/16/2011    Family History  Problem Relation Age of Onset   Hypertension Mother    Hypertension Sister    Hypertension Son    Colon cancer Neg Hx    Esophageal cancer Neg Hx    Rectal cancer Neg Hx    Stomach cancer Neg Hx    Breast cancer Neg Hx     Social History   Socioeconomic History   Marital status: Married    Spouse name: Not on file   Number of children: 4   Years of education: Not on file   Highest education level: Not on file  Occupational History   Occupation: [employer]    Employer: [employer]  Tobacco Use   Smoking status: Never   Smokeless tobacco: Never  Vaping Use   Vaping Use: Never used  Substance and Sexual Activity   Alcohol use: Yes    Comment: occasional wine   Drug use: No   Sexual activity: Yes    Partners: Male    Birth control/protection: Post-menopausal, Surgical    Comment: Hyst, First IC >16y/o, <5 Partners  Other Topics Concern   Not on file  Social History Narrative   Not on file   Social Determinants of Health   Financial Resource Strain: Not on file  Food Insecurity: Not on file  Transportation Needs: Not on file  Physical Activity: Not on file  Stress: Not on file  Social Connections: Not on file  Intimate Partner Violence: Not on file    Review of Systems: All other review of systems negative except as  mentioned in the HPI.  Physical Exam: Vital signs BP (!) 145/71   Pulse (!) 50   Temp 97.6 F (36.4 C) (Temporal)   Resp 16   Ht 5' 3"$  (1.6 m)   Wt 83.5 kg   LMP 08/23/2006   SpO2 100%   BMI 32.59 kg/m   General:   Alert,  Well-developed, pleasant and cooperative in NAD Lungs:  Clear throughout to auscultation.   Heart:  Regular rate and rhythm Abdomen:  Soft, nontender and nondistended.  Neuro/Psych:  Alert and cooperative. Normal mood and affect. A and O x 3  Jolly Mango, MD [facility]

## 2023-02-13 NOTE — Transfer of Care (Signed)
Immediate Anesthesia Transfer of Care Note  Patient: Denessa Mui  Procedure(s) Performed: ESOPHAGOGASTRODUODENOSCOPY (EGD) WITH PROPOFOL  Patient Location: PACU  Anesthesia Type:MAC  Level of Consciousness: awake, alert , oriented, and patient cooperative  Airway & Oxygen Therapy: Patient Spontanous Breathing and Patient connected to face mask oxygen  Post-op Assessment: Report given to RN, Post -op Vital signs reviewed and stable, and Patient moving all extremities  Post vital signs: Reviewed and stable  Last Vitals:  Vitals Value Taken Time  BP    Temp    Pulse 72 02/13/23 0933  Resp 17 02/13/23 0933  SpO2 100 % 02/13/23 0933  Vitals shown include unvalidated device data.  Last Pain:  Vitals:   02/13/23 0802  TempSrc: Temporal  PainSc: 0-No pain         Complications: No notable events documented.

## 2023-02-13 NOTE — Anesthesia Postprocedure Evaluation (Signed)
Anesthesia Post Note  Patient: Cynthia Berger  Procedure(s) Performed: ESOPHAGOGASTRODUODENOSCOPY (EGD) WITH PROPOFOL     Patient location during evaluation: PACU Anesthesia Type: MAC Level of consciousness: awake and alert Pain management: pain level controlled Vital Signs Assessment: post-procedure vital signs reviewed and stable Respiratory status: spontaneous breathing, nonlabored ventilation and respiratory function stable Cardiovascular status: blood pressure returned to baseline Postop Assessment: no apparent nausea or vomiting Anesthetic complications: no   No notable events documented.  Last Vitals:  Vitals:   02/13/23 0949 02/13/23 0951  BP: 117/71   Pulse: 66 64  Resp: (!) 21 16  Temp:    SpO2: 98% 99%    Last Pain:  Vitals:   02/13/23 0954  TempSrc:   PainSc: 0-No pain                 Marthenia Rolling

## 2023-02-13 NOTE — Op Note (Signed)
[facility] Patient Name: Cynthia Berger Procedure Date: 02/13/2023 MRN: [id number] Attending MD: Carlota Raspberry. Havery Moros , MD, [id number] Date of Birth: [birth year] CSN: [id number] Age: 67 Admit Type: Outpatient Procedure:                Upper GI endoscopy Indications:              history of gastro-esophageal reflux disease - on                            pepcid with occasional breakthrough. Trying to                            avoid PPI in light of her history of renal cell CA                            s/p nephrectomy, to reduce her risk of CKD. EGD to                            ensure no Barrett's / erosive changes, or need for                            PPI therapy Providers:                Remo Lipps P. Havery Moros, MD, Altamese Cabal, RN, Cherylynn Ridges, Technician, Courtney Heys Armistead, CRNA Referring MD:              Medicines:                Monitored Anesthesia Care Complications:            No immediate complications. Estimated blood loss:                            None. Estimated Blood Loss:     Estimated blood loss: none. Procedure:                Pre-Anesthesia Assessment:                           - Prior to the procedure, a History and Physical                            was performed, and patient medications and                            allergies were reviewed. The patient's tolerance of                            previous anesthesia was also reviewed. The risks                            and benefits of the procedure and the sedation  options and risks were discussed with the patient.                            All questions were answered, and informed consent                            was obtained. Prior Anticoagulants: The patient has                            taken no anticoagulant or antiplatelet agents. ASA                            Grade Assessment: II - A patient with mild systemic                             disease. After reviewing the risks and benefits,                            the patient was deemed in satisfactory condition to                            undergo the procedure.                           After obtaining informed consent, the endoscope was                            passed under direct vision. Throughout the                            procedure, the patient's blood pressure, pulse, and                            oxygen saturations were monitored continuously. The                            [id number] Olympus endoscope was introduced                            through the mouth, and advanced to the second part                            of duodenum. The upper GI endoscopy was                            accomplished without difficulty. The patient                            tolerated the procedure well. Scope In: Scope Out: Findings:      Esophagogastric landmarks were identified: the Z-line was found at 37       cm, the gastroesophageal junction was found at 37 cm and the upper       extent of the gastric folds was found at 38 cm from  the incisors.      A 1 cm hiatal hernia was present.      The exam of the esophagus was otherwise normal. No Barrett's or erosive       changes.      The entire examined stomach was normal.      The examined duodenum was normal. Impression:               - Esophagogastric landmarks identified.                           - 1 cm hiatal hernia.                           - Normal esophagus otherwise - no Barrett's or                            erosive changes                           - Normal stomach.                           - Normal examined duodenum.                           Overall, no need for PPI in regards to presence of                            Barrett's or erosive changes while on pepcid, exam                            looks good. Management of reflux will be based on                            symptoms and how  much this bothers her. Can                            increase pepcid to twice daily more routinely if                            having some breakthrough, and avoid food triggers. Moderate Sedation:      No moderate sedation, case performed with MAC Recommendation:           - Patient has a contact number available for                            emergencies. The signs and symptoms of potential                            delayed complications were discussed with the                            patient. Return to normal activities tomorrow.  Written discharge instructions were provided to the                            patient.                           - Resume previous diet.                           - Continue present medications.                           - Can use pepcid twice daily more routinely if                            needed. Do not see that you need PPI at this time                            unless symptoms more bothersome. Procedure Code(s):        --- Professional ---                           [phone], Esophagogastroduodenoscopy, flexible,                            transoral; diagnostic, including collection of                            specimen(s) by brushing or washing, when performed                            (separate procedure) Diagnosis Code(s):        --- Professional ---                           K44.9, Diaphragmatic hernia without obstruction or                            gangrene                           K21.9, Gastro-esophageal reflux disease without                            esophagitis CPT copyright 2022 American Medical Association. All rights reserved. The codes documented in this report are preliminary and upon coder review may  be revised to meet current compliance requirements. Remo Lipps P. Holden Draughon, MD 02/13/2023 9:30:05 AM This report has been signed electronically. Number of Addenda: 0

## 2023-02-15 ENCOUNTER — Encounter (HOSPITAL_COMMUNITY): Payer: Self-pay | Admitting: Gastroenterology

## 2023-02-23 ENCOUNTER — Other Ambulatory Visit: Payer: Self-pay | Admitting: Family Medicine

## 2023-02-23 DIAGNOSIS — Z1231 Encounter for screening mammogram for malignant neoplasm of breast: Secondary | ICD-10-CM

## 2023-03-15 ENCOUNTER — Other Ambulatory Visit: Payer: Self-pay | Admitting: Gastroenterology

## 2023-03-25 NOTE — Progress Notes (Unsigned)
HPI: [name] is a 67 y.o. female with PMHx significant for Vit D def,HTN, CKD III,multinodular goiter, OSA,solitary kidney, and hypothyroidism  here today for welcome to medicare.  Last seen on 04/15/22 for her annual exam.  She maintains an active and independent lifestyle.  She  engages in walking three times a week for one to two hours. Her diet is healthy, with daily vegetable intake and a preference for home-cooked meals.  She reports no changes in hearing. She is still engaged in part-time work.  Health Maintenance  Topic Date Due   Zoster (Shingles) Vaccine (1 of 2) Never done   COVID-19 Vaccine (4 - 2023-24 season) 04/12/2023*   Flu Shot  07/20/2023   Mammogram  03/04/2024   Medicare Annual Wellness Visit  03/26/2024   DTaP/Tdap/Td vaccine (2 - Td or Tdap) 06/09/2024   Colon Cancer Screening  12/26/2027   Pap Smear  02/03/2028   Pneumonia Vaccine  Completed   DEXA scan (bone density measurement)  Completed   Hepatitis C Screening: USPSTF Recommendation to screen - Ages 7318-79 yo.  Completed   HIV Screening  Completed   HPV Vaccine  Aged Out  *Topic was postponed. The date shown is not the original due date.   Immunization History  Administered Date(s) Administered   Fluad Quad(high Dose 65+) 09/28/2022   Influenza,inj,Quad PF,6+ Mos 10/24/2017, 01/03/2019, 02/03/2020   Influenza-Unspecified 09/10/2013   PFIZER(Purple Top)SARS-COV-2 Vaccination 02/26/2020, 03/18/2020, 10/09/2020   PNEUMOCOCCAL CONJUGATE-20 03/27/2023   Pneumococcal Polysaccharide-23 10/24/2017   Tdap 06/09/2014  She has completed the shingrix vaccine series.  Functional Status Survey: Is the patient deaf or have difficulty hearing?: No Does the patient have difficulty seeing, even when wearing glasses/contacts?: No Does the patient have difficulty concentrating, remembering, or making decisions?: No Does the patient have difficulty walking or climbing stairs?: No Does the patient have  difficulty dressing or bathing?: No Does the patient have difficulty doing errands alone such as visiting a doctor's office or shopping?: No     03/27/2023    2:29 PM 02/02/2023   10:59 AM 04/15/2022    9:18 AM  Fall Risk   Falls in the past year? 0 0 0  Number falls in past yr: 0 0 0  Injury with Fall? 0 0 0  Risk for fall due to : No Fall Risks  No Fall Risks  Follow up Falls evaluation completed  Falls evaluation completed   Providers she sees regularly: Endocrinologist:  Dr. Lafe GarinGherge Eye care provider: Dr. August Saucerean Urologist: She was following every 3 months, recently changed to every 6 months.  History of malignant neoplasia of kidney, status post right nephrectomy in 2005. Gynecologist: Wyline Beadyiffany Wallace NP. Every 1-2 years after turning 66.     03/27/2023    2:29 PM  Depression screen PHQ 2/9  Decreased Interest 0  Down, Depressed, Hopeless 0  PHQ - 2 Score 0    Mini-Cog - 03/27/23 1453     Normal clock drawing test? yes    How many words correct? 3            Vision Screening   Right eye Left eye Both eyes  Without correction     With correction 20/25 20/25 20/25   Hearing Screening - Comments:: Gross hearing intact,bilateral.  Hypertension: She is on amlodipine 5 mg daily and metoprolol succinate 25 mg daily. History of bradycardia, she does not monitor HR at home. BP readings 120-130/70-80. Negative for severe/frequent headache, visual changes, chest pain, dyspnea,  palpitation, focal weakness, or edema. CKD III: Negative for form in urine, gross hematuria, or decreased urine output.  Lab Results  Component Value Date   CREATININE 1.18 04/15/2022   BUN 14 04/15/2022   NA 137 04/15/2022   K 4.3 04/15/2022   CL 103 04/15/2022   CO2 27 04/15/2022   Hyperlipidemia: Currently she is on nonpharmacologic treatment. Lab Results  Component Value Date   CHOL 191 04/15/2022   HDL 54.40 04/15/2022   LDLCALC 118 (H) 04/15/2022   TRIG 91.0 04/15/2022   CHOLHDL 4  04/15/2022   Lab Results  Component Value Date   TSH 1.11 01/25/2023   Review of Systems  Constitutional:  Negative for chills and fever.  HENT:  Negative for mouth sores and sore throat.   Respiratory:  Negative for cough and wheezing.   Gastrointestinal:  Negative for abdominal pain, nausea and vomiting.  Endocrine: Negative for cold intolerance and heat intolerance.  Neurological:  Negative for dizziness, syncope and facial asymmetry.  See other pertinent positives and negatives in HPI.  Current Outpatient Medications on File Prior to Visit  Medication Sig Dispense Refill   amLODipine (NORVASC) 5 MG tablet TAKE 1 TABLET BY MOUTH EVERY DAY 90 tablet 3   cholecalciferol (VITAMIN D) 1000 UNITS tablet Take 1,000 Units by mouth daily.     famotidine (PEPCID) 20 MG tablet Take 1 tablet (20 mg total) by mouth 2 (two) times daily. 180 tablet 1   metoprolol succinate (TOPROL-XL) 25 MG 24 hr tablet TAKE 1 TABLET BY MOUTH EVERY DAY 90 tablet 2   Multiple Vitamins-Minerals (MULTIVITAMIN PO) Take 1 tablet by mouth daily.      No current facility-administered medications on file prior to visit.   Past Medical History:  Diagnosis Date   AC (acromioclavicular) joint bone spurs    HEEL BONE SPURS-SURGERY 12/2010 LEFT FOOT   Antral gastritis    Cancer of kidney    Closed fracture of fifth toe of left foot with malunion    right   DDD (degenerative disc disease), cervical    Difficult airway for intubation, initial encounter 05/28/2013   DL once by CRNA, MAC 3, no view of cords. DL twice by anesthesiologist MAC 3 no view of cords. ); Video Laryngoscope (Good view with glidescope. Cords were anterior)   Esophageal dysmotility    GERD (gastroesophageal reflux disease)    Heart palpitations    Hiatal hernia    Hypertension    Hyperthyroidism    LGSIL (low grade squamous intraepithelial dysplasia) 2013    C&B Inadequate ECC--positive LGSIL --   Multinodular goiter    RIGHT LOBE-- PER BX  NEGATIVE   Positive H. pylori test    Thyroid nodule    Vitamin D deficiency 11/2007   LOW AT 18   Allergies  Allergen Reactions   Penicillins Rash    Social History   Socioeconomic History   Marital status: Married    Spouse name: Not on file   Number of children: 4   Years of education: Not on file   Highest education level: Not on file  Occupational History   Occupation: [employer]    Employer: [employer]  Tobacco Use   Smoking status: Never   Smokeless tobacco: Never  Vaping Use   Vaping Use: Never used  Substance and Sexual Activity   Alcohol use: Yes    Comment: occasional wine   Drug use: No   Sexual activity: Yes    Partners:  Male    Birth control/protection: Post-menopausal, Surgical    Comment: Hyst, First IC >16y/o, <5 Partners  Other Topics Concern   Not on file  Social History Narrative   Not on file   Social Determinants of Health   Financial Resource Strain: Not on file  Food Insecurity: Not on file  Transportation Needs: Not on file  Physical Activity: Not on file  Stress: Not on file  Social Connections: Not on file   Vitals:   03/27/23 1413  BP: 130/70  Pulse: 64  Resp: 16  Temp: 98 F (36.7 C)  SpO2: 98%   Body mass index is 32.99 kg/m.  Physical Exam Vitals and nursing note reviewed.  Constitutional:      General: She is not in acute distress.    Appearance: She is well-developed.  HENT:     Head: Normocephalic and atraumatic.     Mouth/Throat:     Mouth: Mucous membranes are moist.     Pharynx: Oropharynx is clear.  Eyes:     Conjunctiva/sclera: Conjunctivae normal.  Cardiovascular:     Rate and Rhythm: Normal rate and regular rhythm.     Pulses:          Dorsalis pedis pulses are 2+ on the right side and 2+ on the left side.     Heart sounds: No murmur heard. Pulmonary:     Effort: Pulmonary effort is normal. No respiratory distress.     Breath sounds: Normal breath sounds.  Abdominal:     Palpations:  Abdomen is soft. There is no hepatomegaly or mass.     Tenderness: There is no abdominal tenderness.  Lymphadenopathy:     Cervical: No cervical adenopathy.  Skin:    General: Skin is warm.     Findings: No erythema or rash.  Neurological:     General: No focal deficit present.     Mental Status: She is alert and oriented to person, place, and time.     Cranial Nerves: No cranial nerve deficit.     Gait: Gait normal.  Psychiatric:        Mood and Affect: Mood and affect normal.   ASSESSMENT AND PLAN:  Ms. Earldean was seen today for medicare wellness.  Diagnoses and all orders for this visit: Welcome to Medicare preventive visit Assessment & Plan: We discussed the importance of staying active, physically and mentally, as well as the benefits of a healthy/balance diet. Low impact exercise that involve stretching and strengthing are ideal. Vaccines updated. We discussed preventive screening for the next 5-10 years, summery of recommendations given in AVS. Health Maintenance  Topic Date Due   Zoster (Shingles) Vaccine (1 of 2) Never done   COVID-19 Vaccine (4 - 2023-24 season) 04/12/2023*   Flu Shot  07/20/2023   Mammogram  03/04/2024   Medicare Annual Wellness Visit  03/26/2024   DTaP/Tdap/Td vaccine (2 - Td or Tdap) 06/09/2024   Colon Cancer Screening  12/26/2027   Pap Smear  02/03/2028   Pneumonia Vaccine  Completed   DEXA scan (bone density measurement)  Completed   Hepatitis C Screening: USPSTF Recommendation to screen - Ages 40-79 yo.  Completed   HIV Screening  Completed   HPV Vaccine  Aged Out  *Topic was postponed. The date shown is not the original due date.  Pap smear:N/A, s/p hysterectomy.  She follows with gynecologist. Fall prevention. Advance directives and end of life discussed, she does not have POA or living will, forms given today.  Vitamin D deficiency, unspecified Assessment & Plan: Continue vitamin D at 1000 units daily. Further recommendation will  be given according to 25 [place] vitamin D result.  Orders: -     VITAMIN D 25 Hydroxy (Vit-D Deficiency, Fractures); Future  Stage 3a chronic kidney disease Assessment & Plan: Creatinine 1.1-1.3, e GFR 48-54. Continue adequate hydration, low-salt diet, and avoidance of NSAIDs. Adequate BP control.  Orders: -     Comprehensive metabolic panel; Future -     CBC; Future -     Microalbumin / creatinine urine ratio; Future  Hyperlipidemia, unspecified hyperlipidemia type -     Comprehensive metabolic panel; Future -     Lipid panel; Future  Need for pneumococcal vaccination -     Pneumococcal conjugate vaccine 20-valent  Malignant neoplasm of kidney excluding renal pelvis, unspecified laterality Assessment & Plan: Status post right nephrectomy in 2005. Following with urologist every 6 months.   HFrEF (heart failure with reduced ejection fraction) Assessment & Plan: Euvolemic. Mildly decreased LVEF in 03/2012. MUGA study 05/2012 with LVEF 68%. She is not longer following with cardiologist.   Return in about 6 months (around 09/26/2023) for CPE.  Rhyen Mazariego G. [place], MD  [facility]. [place] office.

## 2023-03-27 ENCOUNTER — Ambulatory Visit: Payer: Medicare HMO | Admitting: Family Medicine

## 2023-03-27 ENCOUNTER — Encounter: Payer: Self-pay | Admitting: Family Medicine

## 2023-03-27 VITALS — BP 130/70 | HR 64 | Temp 98.0°F | Resp 16 | Ht 63.0 in | Wt 186.2 lb

## 2023-03-27 DIAGNOSIS — Z0000 Encounter for general adult medical examination without abnormal findings: Secondary | ICD-10-CM | POA: Diagnosis not present

## 2023-03-27 DIAGNOSIS — N1831 Chronic kidney disease, stage 3a: Secondary | ICD-10-CM | POA: Diagnosis not present

## 2023-03-27 DIAGNOSIS — I5020 Unspecified systolic (congestive) heart failure: Secondary | ICD-10-CM | POA: Diagnosis not present

## 2023-03-27 DIAGNOSIS — E559 Vitamin D deficiency, unspecified: Secondary | ICD-10-CM | POA: Diagnosis not present

## 2023-03-27 DIAGNOSIS — C649 Malignant neoplasm of unspecified kidney, except renal pelvis: Secondary | ICD-10-CM | POA: Diagnosis not present

## 2023-03-27 DIAGNOSIS — Z23 Encounter for immunization: Secondary | ICD-10-CM

## 2023-03-27 DIAGNOSIS — E785 Hyperlipidemia, unspecified: Secondary | ICD-10-CM

## 2023-03-27 NOTE — Patient Instructions (Addendum)
  Ms. Folmsbee , Thank you for taking time to come for your Medicare Wellness Visit. I appreciate your ongoing commitment to your health goals. Please review the following plan we discussed and let me know if I can assist you in the future.   These are the goals we discussed:  Goals   Continue regular physical activity and a healthful diet. Power of attorney and living will forms given today, once completed and notarized, please bring [place] a copy.      This is a list of the screening recommended for you and due dates:  Health Maintenance  Topic Date Due   Zoster (Shingles) Vaccine (1 of 2) Never done   Pneumonia Vaccine (2 of 2 - PCV) 05/13/2022   COVID-19 Vaccine (4 - 2023-24 season) 04/12/2023*   Flu Shot  07/20/2023   Mammogram  03/04/2024   Medicare Annual Wellness Visit  03/26/2024   DTaP/Tdap/Td vaccine (2 - Td or Tdap) 06/09/2024   Colon Cancer Screening  12/26/2027   Pap Smear  02/03/2028   DEXA scan (bone density measurement)  Completed   Hepatitis C Screening: USPSTF Recommendation to screen - Ages 101-79 yo.  Completed   HIV Screening  Completed   HPV Vaccine  Aged Out  *Topic was postponed. The date shown is not the original due date.   A few things to remember from today's visit:  Welcome to Medicare preventive visit  If you need refills for medications you take chronically, please call your pharmacy. Do not use My Chart to request refills or for acute issues that need immediate attention. If you send a my chart message, it may take a few days to be addressed, specially if I am not in the office.  Please be sure medication list is accurate. If a new problem present, please set up appointment sooner than planned today.

## 2023-03-27 NOTE — Assessment & Plan Note (Signed)
Status post right nephrectomy in 2005. Following with urologist every 6 months.

## 2023-03-27 NOTE — Assessment & Plan Note (Signed)
Continue vitamin D at 1000 units daily. Further recommendation will be given according to 25 OH vitamin D result. 

## 2023-03-27 NOTE — Assessment & Plan Note (Signed)
Creatinine 1.1-1.3, e GFR 48-54. Continue adequate hydration, low-salt diet, and avoidance of NSAIDs. Adequate BP control.

## 2023-03-27 NOTE — Assessment & Plan Note (Addendum)
We discussed the importance of staying active, physically and mentally, as well as the benefits of a healthy/balance diet. Low impact exercise that involve stretching and strengthing are ideal. Vaccines updated. We discussed preventive screening for the next 5-10 years, summery of recommendations given in AVS. Health Maintenance  Topic Date Due   Zoster (Shingles) Vaccine (1 of 2) Never done   COVID-19 Vaccine (4 - 2023-24 season) 04/12/2023*   Flu Shot  07/20/2023   Mammogram  03/04/2024   Medicare Annual Wellness Visit  03/26/2024   DTaP/Tdap/Td vaccine (2 - Td or Tdap) 06/09/2024   Colon Cancer Screening  12/26/2027   Pap Smear  02/03/2028   Pneumonia Vaccine  Completed   DEXA scan (bone density measurement)  Completed   Hepatitis C Screening: USPSTF Recommendation to screen - Ages 49-79 yo.  Completed   HIV Screening  Completed   HPV Vaccine  Aged Out  *Topic was postponed. The date shown is not the original due date.  Pap smear:N/A, s/p hysterectomy.  She follows with gynecologist. Fall prevention. Advance directives and end of life discussed, she does not have POA or living will, forms given today.

## 2023-03-27 NOTE — Assessment & Plan Note (Signed)
Euvolemic. Mildly decreased LVEF in 03/2012. MUGA study 05/2012 with LVEF 68%. She is not longer following with cardiologist.

## 2023-03-28 LAB — COMPREHENSIVE METABOLIC PANEL
ALT: 10 U/L (ref 0–35)
AST: 17 U/L (ref 0–37)
Albumin: 4.1 g/dL (ref 3.5–5.2)
Alkaline Phosphatase: 90 U/L (ref 39–117)
BUN: 14 mg/dL (ref 6–23)
CO2: 26 mEq/L (ref 19–32)
Calcium: 9.4 mg/dL (ref 8.4–10.5)
Chloride: 103 mEq/L (ref 96–112)
Creatinine, Ser: 1.19 mg/dL (ref 0.40–1.20)
GFR: 47.86 mL/min — ABNORMAL LOW (ref >60.00)
Glucose, Bld: 106 mg/dL — ABNORMAL HIGH (ref 70–99)
Potassium: 4 mEq/L (ref 3.5–5.1)
Sodium: 137 mEq/L (ref 135–145)
Total Bilirubin: 0.4 mg/dL (ref 0.2–1.2)
Total Protein: 7.6 g/dL (ref 6.0–8.3)

## 2023-03-28 LAB — CBC
HCT: 39.2 % (ref 36.0–46.0)
Hemoglobin: 12.9 g/dL (ref 12.0–15.0)
MCHC: 32.9 g/dL (ref 30.0–36.0)
MCV: 88.8 fl (ref 78.0–100.0)
Platelets: 270 10*3/uL (ref 150.0–400.0)
RBC: 4.41 Mil/uL (ref 3.87–5.11)
RDW: 14 % (ref 11.5–15.5)
WBC: 6.5 10*3/uL (ref 4.0–10.5)

## 2023-03-28 LAB — LIPID PANEL
Cholesterol: 193 mg/dL (ref 0–200)
HDL: 59.5 mg/dL (ref >39.00)
LDL Cholesterol: 122 mg/dL — ABNORMAL HIGH (ref 0–99)
NonHDL: 133.01
Total CHOL/HDL Ratio: 3
Triglycerides: 54 mg/dL (ref 0.0–149.0)
VLDL: 10.8 mg/dL (ref 0.0–40.0)

## 2023-03-28 LAB — MICROALBUMIN / CREATININE URINE RATIO
Creatinine,U: 32.6 mg/dL
Microalb Creat Ratio: 2.1 mg/g (ref 0.0–30.0)
Microalb, Ur: <0.7 mg/dL (ref 0.0–1.9)

## 2023-03-28 LAB — VITAMIN D 25 HYDROXY (VIT D DEFICIENCY, FRACTURES): VITD: 39.63 ng/mL (ref 30.00–100.00)

## 2023-04-13 ENCOUNTER — Ambulatory Visit: Payer: Medicare HMO

## 2023-04-18 ENCOUNTER — Ambulatory Visit
Admission: RE | Admit: 2023-04-18 | Discharge: 2023-04-18 | Disposition: A | Discharge status: Home / Self Care | Payer: Medicare HMO | Source: Ambulatory Visit | Attending: Family Medicine | Admitting: Family Medicine

## 2023-04-18 DIAGNOSIS — Z1231 Encounter for screening mammogram for malignant neoplasm of breast: Secondary | ICD-10-CM | POA: Diagnosis not present

## 2023-05-23 DIAGNOSIS — H53451 Other localized visual field defect, right eye: Secondary | ICD-10-CM | POA: Diagnosis not present

## 2023-05-23 DIAGNOSIS — H26493 Other secondary cataract, bilateral: Secondary | ICD-10-CM | POA: Diagnosis not present

## 2023-05-23 DIAGNOSIS — H04123 Dry eye syndrome of bilateral lacrimal glands: Secondary | ICD-10-CM | POA: Diagnosis not present

## 2023-05-23 DIAGNOSIS — H35373 Puckering of macula, bilateral: Secondary | ICD-10-CM | POA: Diagnosis not present

## 2023-05-23 DIAGNOSIS — H40023 Open angle with borderline findings, high risk, bilateral: Secondary | ICD-10-CM | POA: Diagnosis not present

## 2023-06-30 DIAGNOSIS — H53453 Other localized visual field defect, bilateral: Secondary | ICD-10-CM | POA: Diagnosis not present

## 2023-07-27 DIAGNOSIS — C641 Malignant neoplasm of right kidney, except renal pelvis: Secondary | ICD-10-CM | POA: Diagnosis not present

## 2023-08-28 DIAGNOSIS — H04123 Dry eye syndrome of bilateral lacrimal glands: Secondary | ICD-10-CM | POA: Diagnosis not present

## 2023-08-28 DIAGNOSIS — H26493 Other secondary cataract, bilateral: Secondary | ICD-10-CM | POA: Diagnosis not present

## 2023-08-28 DIAGNOSIS — H40023 Open angle with borderline findings, high risk, bilateral: Secondary | ICD-10-CM | POA: Diagnosis not present

## 2023-08-28 DIAGNOSIS — H35373 Puckering of macula, bilateral: Secondary | ICD-10-CM | POA: Diagnosis not present

## 2023-08-28 DIAGNOSIS — H53451 Other localized visual field defect, right eye: Secondary | ICD-10-CM | POA: Diagnosis not present

## 2023-08-29 ENCOUNTER — Other Ambulatory Visit: Payer: Self-pay | Admitting: Gastroenterology

## 2023-09-01 ENCOUNTER — Other Ambulatory Visit: Payer: Self-pay | Admitting: Family Medicine

## 2023-09-01 DIAGNOSIS — I10 Essential (primary) hypertension: Secondary | ICD-10-CM

## 2023-11-06 DIAGNOSIS — H40023 Open angle with borderline findings, high risk, bilateral: Secondary | ICD-10-CM | POA: Diagnosis not present

## 2023-11-06 DIAGNOSIS — H26493 Other secondary cataract, bilateral: Secondary | ICD-10-CM | POA: Diagnosis not present

## 2023-11-06 DIAGNOSIS — H04123 Dry eye syndrome of bilateral lacrimal glands: Secondary | ICD-10-CM | POA: Diagnosis not present

## 2023-11-06 DIAGNOSIS — H53451 Other localized visual field defect, right eye: Secondary | ICD-10-CM | POA: Diagnosis not present

## 2023-11-06 DIAGNOSIS — H35373 Puckering of macula, bilateral: Secondary | ICD-10-CM | POA: Diagnosis not present

## 2023-12-22 ENCOUNTER — Ambulatory Visit: Payer: Medicare HMO | Admitting: Internal Medicine

## 2023-12-22 ENCOUNTER — Encounter: Payer: Self-pay | Admitting: Internal Medicine

## 2023-12-22 VITALS — BP 122/80 | HR 71 | Ht 63.0 in | Wt 200.2 lb

## 2023-12-22 DIAGNOSIS — E042 Nontoxic multinodular goiter: Secondary | ICD-10-CM

## 2023-12-22 DIAGNOSIS — E0590 Thyrotoxicosis, unspecified without thyrotoxic crisis or storm: Secondary | ICD-10-CM | POA: Diagnosis not present

## 2023-12-22 NOTE — Patient Instructions (Addendum)
 For now, continue off Methimazole.  Please stop at the lab.  Let's check another thyroid ultrasound after 01/22.  You should have an endocrinology follow-up appointment in 1 year.

## 2023-12-22 NOTE — Progress Notes (Addendum)
 Patient ID: Cynthia Berger, female   DOB: 05/20/1956, 68 y.o.   MRN: [id number]  HPI  Cynthia Berger is a 68 y.o.-year-old female, returning for follow-up for thyrotoxicosis and multinodular goiter.  She previously saw Dr. Kassie, but last visit with me 1 year ago.  Interim history: She recently had a pill n getting stuck in her throat -this irritated her throat, but this resolved with drinking plenty of water . Before last visit, she lost 10 pounds due to reducing portions.  However, she gained 16 pounds back. She stopped walking but plans to restart.   I reviewed pt's thyroid  tests: Lab Results  Component Value Date   TSH 1.11 01/25/2023   TSH 2.44 12/21/2022   TSH 2.05 01/12/2022   TSH 0.15 (L) 01/11/2021   TSH 0.99 01/10/2020   TSH 0.91 01/09/2019   TSH 0.63 10/23/2017   TSH 0.89 02/08/2016   TSH 0.57 02/27/2015   TSH 0.91 06/09/2014   FREET4 0.90 01/25/2023   FREET4 0.92 12/21/2022   FREET4 0.74 01/12/2022   FREET4 0.87 01/11/2021   FREET4 0.88 01/09/2019   FREET4 0.82 06/09/2014   FREET4 0.94 05/10/2013   FREET4 0.90 03/07/2012   T3FREE 3.1 01/25/2023   T3FREE 3.2 12/21/2022   Antithyroid antibodies: Lab Results  Component Value Date   TSI <89 12/21/2022   Reviewed previous imaging and biopsy results: Thyroid  U/S (03/09/2012): Right thyroid  lobe:  5.7 x 2.1 x 3.1 cm.  Left thyroid  lobe:  5.1 x 1.8 x 1.9 cm.  Isthmus:  0.5 cm.   Focal nodules:   There is a 2.9 x 1.2 x 2.5 cm inhomogeneous primarily solid nodule in the midportion of the right lobe.   There is a 1.1 x 0.6 ounces 0.6 cm nodule in the right upper pole.   There is a 0.9 x 0.7 x 1.0 cm solid nodule in the right lower pole.   On the left there is a 0.9 x 0.4 x 0.8 cm solid nodule in the upper pole.   There is a 0.8 x 0.6 x 0.7 cm solid nodule in the left lower pole.   Lymphadenopathy: None visualized.   IMPRESSION:  Multi nodular goiter.  Dominant 2.9 x 1.2 x 2.5 cm nodule in the  midportion of the right  lobe fits national criteria for fine needle  aspiration biopsy.   FNA of the R thyroid  nodule (03/20/2012): Adequacy Reason Satisfactory For Evaluation. Diagnosis THYROID , FINE NEEDLE ASPIRATION, RIGHT: BENIGN THYROID  NODULE. Specimen Clinical Information 2.9 x 1.2 x 2.5cm dominant solid nodule mid right lobe thyroid  Source Thyroid , Fine Needle Aspiration, Right  ... Serial U/S : 2014, 2017, 2020...  Thyroid  U/S (02/01/2020): Parenchymal Echotexture: Mildly heterogenous Isthmus: Normal in size measures 0.5 cm in diameter  Right lobe: Borderline enlarged measuring 5.6 x 2.4 x 2.6 cm, previously, 5.8 x 2.9 x 2.4 cm Left lobe: Normal in size measuring 5.4 x 1.8 x 1.8 cm, previously, 5.2 x 1.8 x 2.1 cm  _________________________________________________________   Estimated total number of nodules >/= 1 cm: 5 _________________________________________________________   The approximately 1.0 x 0.7 x 0.5 cm hypoechoic nodule within the superior pole the right lobe of the thyroid  (labeled 1) is unchanged compared to the 09/2013 examination. Stability for greater than 5 years is indicative benign etiology.   The previously biopsied approximately 3.2 x 2.1 x 1.1 cm hypoechoic nodule within mid aspect the right lobe of the thyroid  (labeled 2), is unchanged compared to the 09/2013 examination, previously, 3.1 x 2.2  x 1.3 cm. Stability for greater than 5 years is indicative of benign etiology.     The approximately 2.0 x 1.6 x 1.3 cm partially cystic though predominantly solid nodule within the inferior pole the right lobe of the thyroid  (labeled 3), has increased in size compared to the 09/2013 examination, previously, 1.2 x 0.9 x 0.8 cm though size differences are attributable to interval partial cystic degeneration, a typically benign finding.   _________________________________________________________   Nodule # 4:  Prior biopsy: No  Location: Left; Superior  Maximum size: 1.8 x  1.6 x 0.8 cm, previously, 1.6 x 1.4 x 0.8 cm when compared to the 01/2019 examination, previously, 1.2 x 1.0 x 0.6 cm when compared to the 01/2016 examination. Composition: solid/almost completely solid (2)  Echogenicity: isoechoic (1) Significant change in size (>/= 20% in two dimensions and minimal increase of 2 mm): Yes *Given size (>/= 1.5 - 2.4 cm) and appearance, a follow-up ultrasound in 1 year should be considered based on TI-RADS criteria.   _________________________________________________________   The approximately 0.9 cm ill-defined nodule/pseudonodule within mid aspect of the left lobe of the thyroid  (labeled 5), is unchanged compared to the 09/2013 examination previously, 0.8 cm, and again does not meet imaging criteria to recommend percutaneous sampling or continued dedicated follow-up.     IMPRESSION: 1. Similar findings of multinodular goiter. 2. Continued increase in size of now 1.9 cm ill-defined isoechoic nodule within the superior pole of the left lobe of the thyroid  (labeled #4), which again meets imaging criteria to recommend a 1 year follow-up. 3. Unchanged appearance of previously biopsied dominant nodule within the right lobe of the thyroid  (labeled #3). Assuming a benign pathologic diagnosis, repeat sampling and/or continued dedicated follow-up is not recommended.  Thyroid  U/S (01/09/2023): Parenchymal Echotexture: Mildly heterogeneous  Isthmus: 0.6 cm  Right lobe: 5.4 x 2.3 x 2.9 cm  Left lobe: 5.1 x 1.9 x 2.0 cm  _________________________________________________________   Estimated total number of nodules >/= 1 cm: 5 _________________________________________________________   Nodule 1: 1.2 x 0.9 x 0.6 cm solid hypoechoic right superior thyroid  nodule is not significantly changed in size since 02/11/2016 where it measured 0.9 x 0.5 x 0.7 cm, which is consistent with a benign etiology.  _________________________________________________________    Nodule 2: 0.6 cm cystic nodule in the superior right thyroid  lobe does not meet criteria for imaging surveillance or FNA.  _________________________________________________________   Nodule 3: 3.1 x 2.5 x 0.8 cm solid isoechoic right mid thyroid  nodule is unchanged in size since 02/11/2016 which indicates a benign etiology. Please correlate with prior FNA results from 03/20/2012.  _________________________________________________________   Nodule # 4:  Prior biopsy: No  Location: Right; inferior  Maximum size: 2.7 cm; Other 2 dimensions: 2.3 x 2.1 cm, previously, 2.0 x 1.9 x 1.3 cm  Composition: solid/almost completely solid (2)  Echogenicity: isoechoic (1)  Significant change in size (>/= 20% in two dimensions and minimal increase of 2 mm): Yes  Change in features: No  Change in ACR TI-RADS risk category: No  **Given size (>/= 2.5 cm) and appearance, fine needle aspiration of this mildly suspicious nodule should be considered based on TI-RADS criteria.  _________________________________________________________   Nodule # 5:  Prior biopsy: No  Location: Left; superior  Maximum size: 2.1 cm; Other 2 dimensions: 2.1 x 1.0 cm, previously, 1.9 x 1.6 x 0.8 cm on 01/31/2020 and 1.2 x 0.6 x 1.1 cm on 02/11/2016.  Composition: solid/almost completely solid (2)  Echogenicity: isoechoic (1) *Given size (>/=  1.5 - 2.4 cm) and appearance, a follow-up ultrasound in 1 year should be considered based on TI-RADS criteria. _________________________________________________________   Remaining subcentimeter left thyroid  nodules do not meet criteria for FNA or imaging follow-up.   IMPRESSION: 1. Nodule 4 (TI-RADS 3), located in the inferior right thyroid  lobe, measuring 2.7 x 2.3 x 2.1 cm, demonstrates interval growth and now meets criteria for FNA. 2. Nodule 5 (TI-RADS 3), located in the superior left thyroid  lobe, measuring 2.1 x 2.1 x 1.0 cm is not significantly changed in size since  prior examination from 02/08/2020 and still meets criteria for imaging follow-up. Annual ultrasound surveillance is recommended until 5 years of stability is documented.  The right inferior isoechoic thyroid  nodule appears to be larger than before, now >2.5 cm.  Will suggest biopsy. The left superior thyroid  nodule is also slightly larger but this is isoechoic and smaller than 2.5 cm so he does not qualify for biopsy yet.  FNA (02/01/2023): Clinical History: Right inferior 2.7cm; Other 2 dimensions: 2.3 x 2.1cm, previously, 2.0 x 1.9 x 1.3cm, Solid / almost completely solid, Isoechoic, TI-RADS total points 3 Specimen Submitted:  A. THYROID , RIGHT INFERIOR, FINE NEEDLE ASPIRATION:   FINAL MICROSCOPIC DIAGNOSIS: - Consistent with benign follicular nodule (Bethesda category II)  SPECIMEN ADEQUACY: Satisfactory for evaluation  Pt denies: - feeling nodules in neck - hoarseness - dysphagia (except occasionally, with pills) - choking  She denies: - fatigue - excessive sweating/heat intolerance - tremors - anxiety - palpitations - hyperdefecation - hair loss  Pt does have a FH of thyroid  ds. In her mother (goiter  - tx with RAI tx). No FH of thyroid  cancer. No h/o radiation tx to head or neck. No recent contrast studies. No steroid use. No herbal supplements. No Biotin use. She is on MVI.  Pt. also has a history of HTN, kidney cancer, esophageal dysmotility, GERD, hiatal hernia, DDD, vitamin D  deficiency.  ROS: + see HPI  Past Medical History:  Diagnosis Date   AC (acromioclavicular) joint bone spurs    HEEL BONE SPURS-SURGERY 12/2010 LEFT FOOT   Antral gastritis    Cancer of kidney (HCC)    Closed fracture of fifth toe of left foot with malunion    right   DDD (degenerative disc disease), cervical    Difficult airway for intubation, initial encounter 05/28/2013   DL once by CRNA, MAC 3, no view of cords. DL twice by anesthesiologist MAC 3 no view of cords. ); Video  Laryngoscope (Good view with glidescope. Cords were anterior)   Esophageal dysmotility    GERD (gastroesophageal reflux disease)    Heart palpitations    Hiatal hernia    Hypertension    Hyperthyroidism    LGSIL (low grade squamous intraepithelial dysplasia) 2013    C&B Inadequate ECC--positive LGSIL --   Multinodular goiter    RIGHT LOBE-- PER BX NEGATIVE   Positive H. pylori test    Thyroid  nodule    Vitamin D  deficiency 11/2007   LOW AT 18   Past Surgical History:  Procedure Laterality Date   ABDOMINAL HYSTERECTOMY N/A 05/28/2013   Procedure: HYSTERECTOMY ABDOMINAL;  Surgeon: Evalene SHAUNNA Organ, MD;  Location: [facility];  Service: Gynecology;  Laterality: N/A;   BIOPSY THYROID  Right 2014   ENLARGED THYROID  NO MEDS BENIGN   CERVICAL BIOPSY  W/ LOOP ELECTRODE EXCISION  10/2012   for persistent low-grade dysplasia   CESAREAN SECTION  1993   W/ TUBAL LIGATION, BILATERAL   CYSTOSCOPY N/A 05/28/2013  Procedure: CYSTOSCOPY;  Surgeon: Evalene SHAUNNA Organ, MD;  Location: [facility];  Service: Gynecology;  Laterality: N/A;   ESOPHAGOGASTRODUODENOSCOPY (EGD) WITH PROPOFOL  N/A 02/13/2023   Procedure: ESOPHAGOGASTRODUODENOSCOPY (EGD) WITH PROPOFOL ;  Surgeon: Leigh Elspeth SHAUNNA, MD;  Location: [facility];  Service: Gastroenterology;  Laterality: N/A;   esophogeal dilatation     FOOT SURGERY  2012   left heel spur removed   HYSTEROSCOPY WITH D & C  06/08/2012   Procedure: DILATATION AND CURETTAGE /HYSTEROSCOPY;  Surgeon: Evalene SHAUNNA Organ, MD;  Location: [facility];  Service: Gynecology;  Laterality: N/A;  WITH RESECTION OF MYOMA   NEPHRECTOMY  2005   RIGHT KIDNEY CANCER   ORIF TOE FRACTURE Right 08/15/2018   Procedure: RIGHT FIFTH TOE OSTEOTOMY AND PINNING;  Surgeon: Barbarann Oneil BROCKS, MD;  Location: [facility];  Service: Orthopedics;  Laterality: Right;   ROTATOR CUFF REPAIR  2010   RIGHT   s/p Right nephrectomy Right    SALPINGOOPHORECTOMY Bilateral 05/28/2013    Procedure: BILATERAL SALPINGO OOPHORECTOMY;  Surgeon: Evalene SHAUNNA Organ, MD;  Location: [facility];  Service: Gynecology;  Laterality: Bilateral;   TOE SURGERY  2020   baby toe on right foot   TRANSTHORACIC ECHOCARDIOGRAM  04-10-2012  DR BRIEN CRENSHAW   LVSF MILDLY REDUCED/ EF 45-50%   TUBAL LIGATION     Social History   Socioeconomic History   Marital status: Married    Spouse name: Not on file   Number of children: 4   Years of education: Not on file   Highest education level: Not on file  Occupational History   Occupation: [employer]    Employer: [employer]  Tobacco Use   Smoking status: Never   Smokeless tobacco: Never  Vaping Use   Vaping status: Never Used  Substance and Sexual Activity   Alcohol use: Yes    Comment: occasional wine   Drug use: No   Sexual activity: Yes    Partners: Male    Birth control/protection: Post-menopausal, Surgical    Comment: Hyst, First IC >16y/o, <5 Partners  Other Topics Concern   Not on file  Social History Narrative   Not on file   Social Drivers of Health   Financial Resource Strain: Not on file  Food Insecurity: Not on file  Transportation Needs: Not on file  Physical Activity: Not on file  Stress: Not on file  Social Connections: Unknown (06/23/2022)   Received from [facility], [facility]   Social Network    Social Network: Not on file  Intimate Partner Violence: Unknown (06/23/2022)   Received [facility], [facility]   HITS    Physically Hurt: Not on file    Insult or Talk Down To: Not on file    Threaten Physical Harm: Not on file    Scream or Curse: Not on file   Current Outpatient Medications on File Prior to Visit  Medication Sig Dispense Refill   amLODipine  (NORVASC ) 5 MG tablet TAKE 1 TABLET BY MOUTH EVERY DAY 90 tablet 2   cholecalciferol (VITAMIN D ) 1000 UNITS tablet Take 1,000 Units by mouth daily.     famotidine  (PEPCID ) 20 MG tablet TAKE 1 TABLET BY MOUTH TWICE A DAY 180 tablet 1    metoprolol  succinate (TOPROL -XL) 25 MG 24 hr tablet TAKE 1 TABLET BY MOUTH EVERY DAY 90 tablet 2   Multiple Vitamins-Minerals (MULTIVITAMIN PO) Take 1 tablet by mouth daily.      No current facility-administered  medications on file prior to visit.   Allergies  Allergen Reactions   Penicillins Rash   Family History  Problem Relation Age of Onset   Hypertension Mother    Hypertension Sister    Hypertension Son    Colon cancer Neg Hx    Esophageal cancer Neg Hx    Rectal cancer Neg Hx    Stomach cancer Neg Hx    Breast cancer Neg Hx    PE: BP 122/80   Pulse 71   Ht 5' 3 (1.6 m)   Wt 200 lb 3.2 oz (90.8 kg)   LMP 08/23/2006   SpO2 99%   BMI 35.46 kg/m  Wt Readings from Last 3 Encounters:  12/22/23 200 lb 3.2 oz (90.8 kg)  03/27/23 186 lb 4 oz (84.5 kg)  02/13/23 184 lb (83.5 kg)   Constitutional: overweight, in NAD Eyes: EOMI, no exophthalmos, no lid lag, no stare ENT: + R thyromegaly, no cervical lymphadenopathy Cardiovascular: RRR, No MRG, + B LE edema Respiratory: CTA B Musculoskeletal: no deformities Skin: no rashes Neurological: no tremor with outstretched hands  ASSESSMENT: 1. Thyrotoxicosis  2.  Multinodular goiter  PLAN:  1. Patient with history of low TSH, without thyrotoxic symptoms: No unintentional weight loss, heat intolerance, hyperdefecation, palpitations, anxiety.  She had an intentional weight loss of 10 pounds in the 9 months prior to our last visit a year ago, but gained 60 pounds since last visit.. -She does not appear to have exogenous causes for the low TSH.  At last visit, we discussed about possible etiologies of her thyrotoxicosis to include Graves' disease (however, TSI antibodies were undetectable), thyroiditis, or toxic multinodular goiter/toxic adenoma.  At last visit, she was on methimazole  low-dose, 2.5 mg daily.  Also, she was on Toprol -XL 25 mg daily, use for hypertension treatment.  Her TFTs were normal at last visit so I advised her to  stop methimazole  and return for repeat labs.  TFTs remain normal in 01/2023 off methimazole .  She continues off the thionamide now. -At today's visit, she denies thyrotoxic symptoms.  She had weight gain since last visit.  She feels that this may be related to stopping walking.  She was previously walking with a neighbor.  She plans to restart walking, but by herself.  We discussed that metoprolol  can sometimes cause weight gain, also. -She does not have signs of active Graves' ophthalmopathy including double vision, blurry vision, eye pain, chemosis -She continues on the beta-blocker.  She is not tachycardic or tremulous. -At today's visit we will recheck her TFTs and may need to start methimazole  afterwards -If the thyroid  tests are normal, we will repeat the tests in a year -RTC in 1 year  2.  Multinodular goiter -Diagnosed more than 10 years ago -No neck compression symptoms except for occasional dysphagia with pills.  She has a history of esophageal dysmotility and hiatal hernia along with GERD, which can all contribute.  Of note, the left thyroid  nodules are rather small and less likely to compress the esophagus -She had serial ultrasounds in the 10 years prior to our last appointment showing slight increase in the size of her dominant nodules, possibly due to accumulation of colloid.  The right dominant nodule was biopsied in 2013 with benign results -At last visit, we repeated her thyroid  ultrasound and the right inferior nodule met criteria for biopsy.  An FNA was performed 02/01/2023 with benign results.  The left superior thyroid  nodule met criteria for 1 year follow-up. -Will  check another ultrasound after 01/10/2024  Orders Placed This Encounter  Procedures   US  THYROID    TSH   T4, free   T3, free   Component     Latest Ref Rng 12/22/2023  T4,Free(Direct)     0.8 - 1.8 ng/dL 1.2   TSH     9.59 - 5.49 mIU/L 1.80   Triiodothyronine,Free,Serum     2.3 - 4.2 pg/mL 2.8   TFTs  remain normal.  Thyroid  U/S (01/15/2024): Parenchymal Echotexture: Moderately heterogeneous  Isthmus: 0.3 cm ,previously 0.6 cm  Right lobe: 4.7 x 2.7 x 3.1 cm ,previously 5.4 x 2.3 x 2.9 cm  Left lobe: 4.7 x 1.7 x 2.0 cm ,previously 5.1 x 1.9 x 2.0 cm  ________________________________________________________   Estimated total number of nodules >/= 1 cm: 4 _________________________________________________________   Unchanged benign-appearing right superior solid thyroid  nodule (labeled 1, 0.2 cm, previously 1.2 cm).   Similar benign appearing right superior thyroid  cyst (labeled 2, 0.7 cm previously 0.6 cm).   Similar appearance of previously biopsied right mid solid thyroid  nodule (labeled 3, 3.1 cm, previously 3.1 cm).   Similar appearance of previously biopsied right inferior solid thyroid  nodule (labeled 4, 2.9 cm, previously 2.7 cm).   Nodule # 5:  Prior biopsy: No  Location: Left; Superior Maximum size: 2.5 cm; Other 2 dimensions: 1.9 x 1.2 cm, previously, 2.1 x 2.1 x 1.0 cm  Composition: mixed cystic and solid (1)  Echogenicity: isoechoic (1) Change in features: Yes Change in ACR TI-RADS risk category: Yes ACR TI-RADS recommendations: This nodule does NOT meet TI-RADS criteria for biopsy or dedicated follow-up.  _________________________________________________________   There is a benign-appearing spongiform nodule in the left inferior thyroid  (labeled 6, 0.8 cm, TI-RADS category 2) not requiring additional follow-up.   No cervical lymphadenopathy.   IMPRESSION: 1. Multinodular thyroid . 2. The previously visualized left superior solid thyroid  nodule (labeled 5, 2.5 cm, previously 2.1 cm) demonstrates interval partial cystic formation and is therefore downgraded to TI-RADS category 2, not requiring additional follow-up. 3. Similar appearance of previously biopsied right mid (labeled 3, 3.1 cm, previously 3.1 cm) and right inferior (labeled 4, 2.9  cm, previously 2.7 cm) thyroid  nodules. Recommend correlation with prior biopsy results.  Lela Fendt, MD PhD [facility]

## 2023-12-23 LAB — T3, FREE: T3, Free: 2.8 pg/mL (ref 2.3–4.2)

## 2023-12-23 LAB — T4, FREE: Free T4: 1.2 ng/dL (ref 0.8–1.8)

## 2023-12-23 LAB — TSH: TSH: 1.8 m[IU]/L (ref 0.40–4.50)

## 2024-01-03 ENCOUNTER — Ambulatory Visit (INDEPENDENT_AMBULATORY_CARE_PROVIDER_SITE_OTHER): Payer: Medicare HMO | Admitting: Internal Medicine

## 2024-01-03 ENCOUNTER — Ambulatory Visit: Payer: Self-pay | Admitting: Family Medicine

## 2024-01-03 ENCOUNTER — Encounter: Payer: Self-pay | Admitting: Internal Medicine

## 2024-01-03 VITALS — BP 130/80 | HR 70 | Temp 98.3°F | Wt 197.4 lb

## 2024-01-03 DIAGNOSIS — M25511 Pain in right shoulder: Secondary | ICD-10-CM

## 2024-01-03 NOTE — Progress Notes (Signed)
 Established Patient Office Visit     CC/Reason for Visit: Acute on chronic right shoulder pain  HPI: Cynthia Berger is a 68 y.o. female who is coming in today for the above mentioned reasons.  Per reports she had a right rotator cuff surgical repair approximately 18 to 20 years ago with [facility].  Has had some limitation to range of motion ever since but no pain.  Over the last 2 weeks she has noted increase of pain in her right shoulder especially with lateral abduction above 110 degrees with some pain extending up into her neck area.  At times has some tingling into her fingertips.  Her range of motion has gotten worse as well.   Past Medical/Surgical History: Past Medical History:  Diagnosis Date   AC (acromioclavicular) joint bone spurs    HEEL BONE SPURS-SURGERY 12/2010 LEFT FOOT   Antral gastritis    Cancer of kidney (HCC)    Closed fracture of fifth toe of left foot with malunion    right   DDD (degenerative disc disease), cervical    Difficult airway for intubation, initial encounter 05/28/2013   DL once by CRNA, MAC 3, no view of cords. DL twice by anesthesiologist MAC 3 no view of cords. ); Video Laryngoscope (Good view with glidescope. Cords were anterior)   Esophageal dysmotility    GERD (gastroesophageal reflux disease)    Heart palpitations    Hiatal hernia    Hypertension    Hyperthyroidism    LGSIL (low grade squamous intraepithelial dysplasia) 2013    C&B Inadequate ECC--positive LGSIL --   Multinodular goiter    RIGHT LOBE-- PER BX NEGATIVE   Positive H. pylori test    Thyroid  nodule    Vitamin D  deficiency 11/2007   LOW AT 18    Past Surgical History:  Procedure Laterality Date   ABDOMINAL HYSTERECTOMY N/A 05/28/2013   Procedure: HYSTERECTOMY ABDOMINAL;  Surgeon: Lacretia Piccolo, MD;  Location: [facility];  Service: Gynecology;  Laterality: N/A;   BIOPSY THYROID  Right 2014   ENLARGED THYROID  NO MEDS BENIGN   CERVICAL BIOPSY  W/ LOOP  ELECTRODE EXCISION  10/2012   for persistent low-grade dysplasia   CESAREAN SECTION  1993   W/ TUBAL LIGATION, BILATERAL   CYSTOSCOPY N/A 05/28/2013   Procedure: CYSTOSCOPY;  Surgeon: Lacretia Piccolo, MD;  Location: [facility];  Service: Gynecology;  Laterality: N/A;   ESOPHAGOGASTRODUODENOSCOPY (EGD) WITH PROPOFOL  N/A 02/13/2023   Procedure: ESOPHAGOGASTRODUODENOSCOPY (EGD) WITH PROPOFOL ;  Surgeon: Ace Holder, MD;  Location: [facility];  Service: Gastroenterology;  Laterality: N/A;   esophogeal dilatation     FOOT SURGERY  2012   left heel spur removed   HYSTEROSCOPY WITH D & C  06/08/2012   Procedure: DILATATION AND CURETTAGE /HYSTEROSCOPY;  Surgeon: Lacretia Piccolo, MD;  Location: [facility];  Service: Gynecology;  Laterality: N/A;  WITH RESECTION OF MYOMA   NEPHRECTOMY  2005   RIGHT KIDNEY CANCER   ORIF TOE FRACTURE Right 08/15/2018   Procedure: RIGHT FIFTH TOE OSTEOTOMY AND PINNING;  Surgeon: Adah Acron, MD;  Location: [facility];  Service: Orthopedics;  Laterality: Right;   ROTATOR CUFF REPAIR  2010   RIGHT   s/p Right nephrectomy Right    SALPINGOOPHORECTOMY Bilateral 05/28/2013   Procedure: BILATERAL SALPINGO OOPHORECTOMY;  Surgeon: Lacretia Piccolo, MD;  Location: [facility];  Service: Gynecology;  Laterality: Bilateral;   TOE SURGERY  2020   baby  toe on right foot   TRANSTHORACIC ECHOCARDIOGRAM  04-10-2012  DR BRIEN CRENSHAW   LVSF MILDLY REDUCED/ EF 45-50%   TUBAL LIGATION      Social History:  reports that she has never smoked. She has never used smokeless tobacco. She reports current alcohol use. She reports that she does not use drugs.  Allergies: Allergies  Allergen Reactions   Penicillins Rash    Family History:  Family History  Problem Relation Age of Onset   Hypertension Mother    Hypertension Sister    Hypertension Son    Colon cancer Neg Hx    Esophageal cancer Neg Hx    Rectal cancer Neg Hx    Stomach cancer  Neg Hx    Breast cancer Neg Hx      Current Outpatient Medications:    amLODipine  (NORVASC ) 5 MG tablet, TAKE 1 TABLET BY MOUTH EVERY DAY, Disp: 90 tablet, Rfl: 2   cholecalciferol (VITAMIN D ) 1000 UNITS tablet, Take 1,000 Units by mouth daily., Disp: , Rfl:    famotidine  (PEPCID ) 20 MG tablet, TAKE 1 TABLET BY MOUTH TWICE A DAY, Disp: 180 tablet, Rfl: 1   metoprolol  succinate (TOPROL -XL) 25 MG 24 hr tablet, TAKE 1 TABLET BY MOUTH EVERY DAY, Disp: 90 tablet, Rfl: 2   Multiple Vitamins-Minerals (MULTIVITAMIN PO), Take 1 tablet by mouth daily. , Disp: , Rfl:   Review of Systems:  Negative unless indicated in HPI.   Physical Exam: Vitals:   01/03/24 1133  BP: 130/80  Pulse: 70  Temp: 98.3 F (36.8 C)  TempSrc: Oral  SpO2: 98%  Weight: 197 lb 6.4 oz (89.5 kg)    Body mass index is 34.97 kg/m.   Physical Exam Musculoskeletal:     Right shoulder: Crepitus present. No swelling or deformity. Decreased range of motion.      Impression and Plan:  Acute pain of right shoulder -     Ambulatory referral to Orthopedic Surgery   -Because pain originates at the shoulder I will refer her back to orthopedics as I suspect that this is related to her previous shoulder issues. -If that is ruled out we will need to consider an MRI of the neck to rule out a cervical myelopathy.  Time spent:23 minutes reviewing chart, interviewing and examining patient and formulating plan of care.     Marguerita Shih, MD [facility]

## 2024-01-03 NOTE — Telephone Encounter (Signed)
  Chief Complaint: shoulder pain Symptoms: right shoulder arm and collarbone pain, swelling Frequency: last week Pertinent Negatives: Patient denies fever, difficulty breathing Disposition: [] ED /[] Urgent Care (no appt availability in office) / [x] Appointment(In office/virtual)/ []  [facility]/ [] Home Care/ [] Refused Recommended Disposition /[] [place] [facility]/ []  Follow-up with PCP Additional Notes: Patient reports that she has been experiencing shoulder, arm, and collarbone pain for the last week. Patient reports she has also noticed swelling that is spreading from her shoulder down to her collarbone. Per protocol, this RN scheduled appt 1/15. Patient advised to call back with worsening symptoms. Patient verbalized understanding.      Copied from CRM [id number]. Topic: Clinical - Red Word Triage >> Jan 03, 2024  7:58 AM Cynthia Berger wrote: Red Word that prompted transfer to Nurse Triage: Patient stated swelling on my collarbone and arm and shoulder sore on right side. Painful when she moves. Reason for Disposition  [1] MILD pain (e.g., does not interfere with normal activities) AND [2] present > 7 days  Answer Assessment - Initial Assessment Questions 1. ONSET: "When did the pain start?"     Last week 2. LOCATION: "Where is the pain located?"     Right shoulder, arm and collarbone 3. PAIN: "How bad is the pain?" (Scale 1-10; or mild, moderate, severe)   - MILD (1-3): doesn't interfere with normal activities   - MODERATE (4-7): interferes with normal activities (e.g., work or school) or awakens from sleep   - SEVERE (8-10): excruciating pain, unable to do any normal activities, unable to move arm at all due to pain     mild 4. WORK OR EXERCISE: "Has there been any recent work or exercise that involved this part of the body?"     Sewing a lot around christmas 5. CAUSE: "What do you think is causing the shoulder pain?"     I'm not sure if it's from overuse with sewing 6. OTHER  SYMPTOMS: "Do you have any other symptoms?" (e.g., neck pain, swelling, rash, fever, numbness, weakness)     Swelling, numbness/tingling  Protocols used: Shoulder Pain-A-AH

## 2024-01-03 NOTE — Telephone Encounter (Signed)
 Noted.

## 2024-01-05 ENCOUNTER — Ambulatory Visit: Payer: Medicare HMO | Admitting: Family Medicine

## 2024-01-09 ENCOUNTER — Encounter: Payer: Self-pay | Admitting: Family Medicine

## 2024-01-09 ENCOUNTER — Ambulatory Visit: Payer: Medicare HMO | Admitting: Family Medicine

## 2024-01-09 VITALS — BP 124/70 | HR 76 | Resp 16 | Ht 63.0 in | Wt 196.5 lb

## 2024-01-09 DIAGNOSIS — N1831 Chronic kidney disease, stage 3a: Secondary | ICD-10-CM

## 2024-01-09 DIAGNOSIS — M542 Cervicalgia: Secondary | ICD-10-CM

## 2024-01-09 DIAGNOSIS — M25511 Pain in right shoulder: Secondary | ICD-10-CM | POA: Diagnosis not present

## 2024-01-09 MED ORDER — DICLOFENAC SODIUM 1 % EX GEL: 2.0000 g | Freq: Four times a day (QID) | CUTANEOUS | 1 refills | Status: AC

## 2024-01-09 NOTE — Patient Instructions (Addendum)
A few things to remember from today's visit:  Sternoclavicular joint pain, right  Acute pain of right shoulder Keep appt with ortho. Voltaren gel and Tylenol 500 mg 3-4 times per day. I did not appreciate swelling.  If you need refills for medications you take chronically, please call your pharmacy. Do not use My Chart to request refills or for acute issues that need immediate attention. If you send a my chart message, it may take a few days to be addressed, specially if I am not in the office.  Please be sure medication list is accurate. If a new problem present, please set up appointment sooner than planned today.

## 2024-01-09 NOTE — Progress Notes (Signed)
ACUTE VISIT Chief Complaint  Patient presents with   Follow-up    Still having swelling & pain   HPI: [name] is a 68 y.o. female with a PMHx significant for vitamin D deficiency, HTN, CKD III, multinodular goiter, OSA, solitary kidney, and hypothyroidism, who is here today complaining of shoulder pain and swelling in her collarbone.   Patient complains of worsening right shoulder pain and swelling for 3 weeks for which she was seen here by Dr. Ardyth Harps on 01/03/2024. She had right shoulder surgery 8 years ago.  She has an appointment with orthopedics tomorrow.   Currently she is still having pain that starts on the side of her shoulder and radiates up to her neck. She rates it as a 5/10, and says it happens with certain movements. Also has had some soreness of her collarbone She endorses some numbness, tingling, and burning sensation going down her arm, which happen intermittently and have improved since onset. No neck pain.  Has occasionally taken tylenol for her pain.  She mentions she sews with poor posture frequently, and also picks up heavy things regularly while cleaning the house, but says these activities are normal for her.  Pertinent negatives include sleep interference, cough, wheezing, chest pain.   Since her last visit she reports that she has seen nephrologist and endocrinologist. CKD III:*** Lab Results  Component Value Date   NA 137 03/27/2023   CL 103 03/27/2023   K 4.0 03/27/2023   CO2 26 03/27/2023   BUN 14 03/27/2023   CREATININE 1.19 03/27/2023   GFR 47.86 (L) 03/27/2023   CALCIUM 9.4 03/27/2023   ALBUMIN 4.1 03/27/2023   GLUCOSE 106 (H) 03/27/2023    Review of Systems  Constitutional:  Negative for appetite change, chills and fever.  HENT:  Negative for sore throat and trouble swallowing.   Respiratory:  Negative for cough and wheezing.   Cardiovascular:  Negative for chest pain.  Gastrointestinal:  Negative for abdominal pain, nausea and  vomiting.  Skin:  Negative for rash.  Neurological:  Negative for syncope and weakness.  See other pertinent positives and negatives in HPI.  Current Outpatient Medications on File Prior to Visit  Medication Sig Dispense Refill   amLODipine (NORVASC) 5 MG tablet TAKE 1 TABLET BY MOUTH EVERY DAY 90 tablet 2   cholecalciferol (VITAMIN D) 1000 UNITS tablet Take 1,000 Units by mouth daily.     famotidine (PEPCID) 20 MG tablet TAKE 1 TABLET BY MOUTH TWICE A DAY 180 tablet 1   metoprolol succinate (TOPROL-XL) 25 MG 24 hr tablet TAKE 1 TABLET BY MOUTH EVERY DAY 90 tablet 2   Multiple Vitamins-Minerals (MULTIVITAMIN PO) Take 1 tablet by mouth daily.      No current facility-administered medications on file prior to visit.    Past Medical History:  Diagnosis Date   AC (acromioclavicular) joint bone spurs    HEEL BONE SPURS-SURGERY 12/2010 LEFT FOOT   Antral gastritis    Cancer of kidney (HCC)    Closed fracture of fifth toe of left foot with malunion    right   DDD (degenerative disc disease), cervical    Difficult airway for intubation, initial encounter 05/28/2013   DL once by CRNA, MAC 3, no view of cords. DL twice by anesthesiologist MAC 3 no view of cords. ); Video Laryngoscope (Good view with glidescope. Cords were anterior)   Esophageal dysmotility    GERD (gastroesophageal reflux disease)    Heart palpitations    Hiatal hernia  Hypertension    Hyperthyroidism    LGSIL (low grade squamous intraepithelial dysplasia) 2013    C&B Inadequate ECC--positive LGSIL --   Multinodular goiter    RIGHT LOBE-- PER BX NEGATIVE   Positive H. pylori test    Thyroid nodule    Vitamin D deficiency 11/2007   LOW AT 18   Allergies  Allergen Reactions   Penicillins Rash    Social History   Socioeconomic History   Marital status: Married    Spouse name: Not on file   Number of children: 4   Years of education: Not on file   Highest education level: 12th grade  Occupational History    Occupation: [employer]    Employer: [employer]  Tobacco Use   Smoking status: Never   Smokeless tobacco: Never  Vaping Use   Vaping status: Never Used  Substance and Sexual Activity   Alcohol use: Yes    Comment: occasional wine   Drug use: No   Sexual activity: Yes    Partners: Male    Birth control/protection: Post-menopausal, Surgical    Comment: Hyst, First IC >16y/o, <5 Partners  Other Topics Concern   Not on file  Social History Narrative   Not on file   Social Drivers of Health   Financial Resource Strain: Patient Declined (01/08/2024)   Overall Financial Resource Strain (CARDIA)    Difficulty of Paying Living Expenses: Patient declined  Food Insecurity: Food Insecurity Present (01/08/2024)   Hunger Vital Sign    Worried About Running Out of Food in the Last Year: Patient declined    Ran Out of Food in the Last Year: Sometimes true  Transportation Needs: No Transportation Needs (01/08/2024)   PRAPARE - Administrator, Civil Service (Medical): No    Lack of Transportation (Non-Medical): No  Physical Activity: Insufficiently Active (01/08/2024)   Exercise Vital Sign    Days of Exercise per Week: 2 days    Minutes of Exercise per Session: 10 min  Stress: No Stress Concern Present (01/08/2024)   [employer] of Occupational Health - Occupational Stress Questionnaire    Feeling of Stress : Not at all  Social Connections: Moderately Integrated (01/08/2024)   Social Connection and Isolation Panel [NHANES]    Frequency of Communication with Friends and Family: More than three times a week    Frequency of Social Gatherings with Friends and Family: More than three times a week    Attends Religious Services: More than 4 times per year    Active Member of [employer] or Organizations: No    Attends Banker Meetings: Not on file    Marital Status: Married    Vitals:   01/09/24 0934  BP: 124/70  Pulse: 76  Resp: 16  SpO2: 98%   Body mass  index is 34.81 kg/m.  Physical Exam Vitals and nursing note reviewed.  Constitutional:      General: She is not in acute distress.    Appearance: She is well-developed.  HENT:     Head: Normocephalic and atraumatic.     Mouth/Throat:     Mouth: Mucous membranes are moist.     Pharynx: Oropharynx is clear. Uvula midline.  Eyes:     Conjunctiva/sclera: Conjunctivae normal.  Neck:     Thyroid: Thyromegaly present.  Cardiovascular:     Rate and Rhythm: Normal rate and regular rhythm.     Heart sounds: No murmur heard. Pulmonary:     Effort: Pulmonary effort  is normal. No respiratory distress.     Breath sounds: Normal breath sounds.  Abdominal:     Palpations: Abdomen is soft.  Musculoskeletal:     Right shoulder: Tenderness present. No swelling. Decreased range of motion (Mild limited active abduction and adduction.).       Arms:     Cervical back: No edema, erythema or spasms. Normal range of motion.       Back:     Comments: Right shoulder: Some muscle atrophy. Juanetta Gosling' test positive, drop arm rotator cuff test negative, empty can supraspinatus test negative, cross body adduction test positive, lift-Off Subscapularis test negative.  Lymphadenopathy:     Cervical: No cervical adenopathy.  Skin:    General: Skin is warm.     Findings: No erythema or rash.  Neurological:     General: No focal deficit present.     Mental Status: She is alert and oriented to person, place, and time.     Gait: Gait normal.  Psychiatric:        Mood and Affect: Mood and affect normal.    ASSESSMENT AND PLAN:  Ms. Stordahl was seen today for shoulder pain and swelling in her collarbone.   Sternoclavicular joint pain, right -     Diclofenac Sodium; Apply 2 g topically 4 (four) times daily.  Dispense: 150 g; Refill: 1  Right shoulder pain, unspecified chronicity *** -     Diclofenac Sodium; Apply 2 g topically 4 (four) times daily.  Dispense: 150 g; Refill: 1  Stage 3a chronic kidney disease  (HCC) She reports that she follows with "kidney doctor", so I did not order labs. Reviewing records I do not see visits, I do see urology visits. ***  Neck pain She reports some burning and tingling in RUE that have resolved, ? Radiculopathy. Right lateral. I do not appreciate edema or erythema and ROM with no significant abnormalities. I do not think imaging is needed at this time. She has an appt with ortho tomorrow.  Return if symptoms worsen or fail to improve, for keep next appointment.  I, Rolla Etienne Wierda, acting as a scribe for Alonia Dibuono [place], MD., have documented all relevant documentation on the behalf of Clancy Mullarkey [place], MD, as directed by  Scotland Dost [place], MD while in the presence of Vishwa Dais [place], MD.   I, Mical Brun [place], MD, have reviewed all documentation for this visit. The documentation on 01/09/24 for the exam, diagnosis, procedures, and orders are all accurate and complete.  Rasul Decola G. [place], MD  [facility] [place] office.

## 2024-01-10 DIAGNOSIS — M24811 Other specific joint derangements of right shoulder, not elsewhere classified: Secondary | ICD-10-CM | POA: Diagnosis not present

## 2024-01-15 ENCOUNTER — Ambulatory Visit
Admission: RE | Admit: 2024-01-15 | Discharge: 2024-01-15 | Disposition: A | Discharge status: Home / Self Care | Payer: Medicare HMO | Source: Ambulatory Visit | Attending: Internal Medicine | Admitting: Internal Medicine

## 2024-01-15 DIAGNOSIS — E042 Nontoxic multinodular goiter: Secondary | ICD-10-CM | POA: Diagnosis not present

## 2024-01-23 ENCOUNTER — Encounter: Payer: Self-pay | Admitting: Internal Medicine

## 2024-01-29 DIAGNOSIS — C641 Malignant neoplasm of right kidney, except renal pelvis: Secondary | ICD-10-CM | POA: Diagnosis not present

## 2024-01-29 DIAGNOSIS — N289 Disorder of kidney and ureter, unspecified: Secondary | ICD-10-CM | POA: Diagnosis not present

## 2024-02-23 DIAGNOSIS — M25511 Pain in right shoulder: Secondary | ICD-10-CM | POA: Diagnosis not present

## 2024-02-28 ENCOUNTER — Other Ambulatory Visit: Payer: Self-pay | Admitting: Gastroenterology

## 2024-02-28 DIAGNOSIS — M25511 Pain in right shoulder: Secondary | ICD-10-CM | POA: Diagnosis not present

## 2024-02-28 DIAGNOSIS — M542 Cervicalgia: Secondary | ICD-10-CM | POA: Diagnosis not present

## 2024-03-19 ENCOUNTER — Other Ambulatory Visit: Payer: Self-pay | Admitting: Family Medicine

## 2024-03-19 DIAGNOSIS — Z1231 Encounter for screening mammogram for malignant neoplasm of breast: Secondary | ICD-10-CM

## 2024-04-01 DIAGNOSIS — M25511 Pain in right shoulder: Secondary | ICD-10-CM | POA: Diagnosis not present

## 2024-04-08 ENCOUNTER — Ambulatory Visit: Payer: Medicare HMO

## 2024-04-18 ENCOUNTER — Ambulatory Visit
Admission: RE | Admit: 2024-04-18 | Discharge: 2024-04-18 | Disposition: A | Discharge status: Home / Self Care | Source: Ambulatory Visit | Attending: Family Medicine | Admitting: Family Medicine

## 2024-04-18 DIAGNOSIS — Z1231 Encounter for screening mammogram for malignant neoplasm of breast: Secondary | ICD-10-CM

## 2024-04-24 ENCOUNTER — Encounter: Admitting: Family Medicine

## 2024-04-24 DIAGNOSIS — E559 Vitamin D deficiency, unspecified: Secondary | ICD-10-CM

## 2024-04-24 DIAGNOSIS — E042 Nontoxic multinodular goiter: Secondary | ICD-10-CM

## 2024-04-24 DIAGNOSIS — N1831 Chronic kidney disease, stage 3a: Secondary | ICD-10-CM

## 2024-04-29 ENCOUNTER — Ambulatory Visit: Admitting: Family Medicine

## 2024-04-29 ENCOUNTER — Ambulatory Visit (INDEPENDENT_AMBULATORY_CARE_PROVIDER_SITE_OTHER): Admitting: Family Medicine

## 2024-04-29 ENCOUNTER — Encounter: Payer: Self-pay | Admitting: Family Medicine

## 2024-04-29 ENCOUNTER — Encounter (INDEPENDENT_AMBULATORY_CARE_PROVIDER_SITE_OTHER): Payer: Self-pay

## 2024-04-29 VITALS — BP 120/70 | HR 60 | Temp 98.1°F | Resp 16 | Ht 63.0 in | Wt 204.2 lb

## 2024-04-29 DIAGNOSIS — E785 Hyperlipidemia, unspecified: Secondary | ICD-10-CM | POA: Diagnosis not present

## 2024-04-29 DIAGNOSIS — E559 Vitamin D deficiency, unspecified: Secondary | ICD-10-CM | POA: Diagnosis not present

## 2024-04-29 DIAGNOSIS — I10 Essential (primary) hypertension: Secondary | ICD-10-CM

## 2024-04-29 DIAGNOSIS — Z0000 Encounter for general adult medical examination without abnormal findings: Secondary | ICD-10-CM | POA: Diagnosis not present

## 2024-04-29 DIAGNOSIS — C649 Malignant neoplasm of unspecified kidney, except renal pelvis: Secondary | ICD-10-CM

## 2024-04-29 DIAGNOSIS — N1831 Chronic kidney disease, stage 3a: Secondary | ICD-10-CM

## 2024-04-29 DIAGNOSIS — I5020 Unspecified systolic (congestive) heart failure: Secondary | ICD-10-CM

## 2024-04-29 LAB — VITAMIN D 25 HYDROXY (VIT D DEFICIENCY, FRACTURES): VITD: 53.52 ng/mL (ref 30.00–100.00)

## 2024-04-29 LAB — LIPID PANEL
Cholesterol: 183 mg/dL (ref 0–200)
HDL: 52 mg/dL (ref >39.00)
LDL Cholesterol: 115 mg/dL — ABNORMAL HIGH (ref 0–99)
NonHDL: 131.09
Total CHOL/HDL Ratio: 4
Triglycerides: 78 mg/dL (ref 0.0–149.0)
VLDL: 15.6 mg/dL (ref 0.0–40.0)

## 2024-04-29 LAB — COMPREHENSIVE METABOLIC PANEL WITH GFR
ALT: 16 U/L (ref 0–35)
AST: 19 U/L (ref 0–37)
Albumin: 4 g/dL (ref 3.5–5.2)
Alkaline Phosphatase: 85 U/L (ref 39–117)
BUN: 18 mg/dL (ref 6–23)
CO2: 25 meq/L (ref 19–32)
Calcium: 9.3 mg/dL (ref 8.4–10.5)
Chloride: 106 meq/L (ref 96–112)
Creatinine, Ser: 1.15 mg/dL (ref 0.40–1.20)
GFR: 49.48 mL/min — ABNORMAL LOW (ref >60.00)
Glucose, Bld: 89 mg/dL (ref 70–99)
Potassium: 4.4 meq/L (ref 3.5–5.1)
Sodium: 139 meq/L (ref 135–145)
Total Bilirubin: 0.3 mg/dL (ref 0.2–1.2)
Total Protein: 7.6 g/dL (ref 6.0–8.3)

## 2024-04-29 LAB — MICROALBUMIN / CREATININE URINE RATIO
Creatinine,U: 23.7 mg/dL
Microalb Creat Ratio: UNDETERMINED mg/g (ref 0.0–30.0)
Microalb, Ur: <0.7 mg/dL

## 2024-04-29 MED ORDER — AMLODIPINE BESYLATE 5 MG PO TABS: ORAL_TABLET | ORAL | 3 refills | Status: AC

## 2024-04-29 MED ORDER — METOPROLOL SUCCINATE ER 25 MG PO TB24: 25.0000 mg | ORAL_TABLET | Freq: Every day | ORAL | 3 refills | Status: AC

## 2024-04-29 NOTE — Assessment & Plan Note (Signed)
 Probably has been stable, last creatinine 1.19 and EGFR 47 in 03/2023. Continue adequate hydration, low-salt diet, and avoidance of NSAIDs. BP is adequately controlled. She is not on ACE inhibitor's or ARB's. As far as problem is stable, we will continue annual follow-up.

## 2024-04-29 NOTE — Assessment & Plan Note (Signed)
Status post right nephrectomy in 2005. Following with urologist every 6 months.

## 2024-04-29 NOTE — Assessment & Plan Note (Signed)
 We discussed the importance of regular physical activity and healthy diet for prevention of chronic illness and/or complications. Preventive guidelines reviewed. Vaccination up to date. Ca++ and vit D supplementation to continue. Continue appointment preventive care with gynecologist. Next CPE in a year.

## 2024-04-29 NOTE — Assessment & Plan Note (Addendum)
 Continue vitamin D  at 1000 units daily + daily multivitamin. Further recommendation will be given according to 25 OH vitamin D  result.

## 2024-04-29 NOTE — Patient Instructions (Signed)
 A few things to remember from today's visit:  Routine general medical examination at a health care facility  Hyperlipidemia, unspecified hyperlipidemia type  Stage 3a chronic kidney disease (HCC)  If you need refills for medications you take chronically, please call your pharmacy. Do not use My Chart to request refills or for acute issues that need immediate attention. If you send a my chart message, it may take a few days to be addressed, specially if I am not in the office.  Please be sure medication list is accurate. If a new problem present, please set up appointment sooner than planned today.

## 2024-04-29 NOTE — Assessment & Plan Note (Signed)
Euvolemic. Mildly decreased LVEF in 03/2012. MUGA study 05/2012 with LVEF 68%. She is not longer following with cardiologist.

## 2024-04-29 NOTE — Assessment & Plan Note (Signed)
 BP adequately controlled, reporting similar readings at home. Continue amlodipine  5 mg daily and metoprolol  succinate 25 mg daily as well as low-salt diet. Eye exam is current. As far as BP at home is stable, annual follow up is appropriate.

## 2024-04-29 NOTE — Assessment & Plan Note (Signed)
 Patient understands the benefits of wt loss as well as adverse effects of obesity. Consistency with healthy diet and physical activity encouraged. She agrees with referral to [facility].

## 2024-04-29 NOTE — Progress Notes (Signed)
 HPI: [name] is a 68 y.o. female  with PMHx significant for HTN, multinodular goiter, and GERD, who is here today for her routine physical.  Last CPE/Welcome to Medicare visit: 03/27/2023  Exercise: Walking and does exercise program 2 days a week for at least 30 minutes.  Diet: Eats well-balanced home cooked meals. Snacks on crackers or cookies, but tends to avoid sweets.  Sleep: Averages 6-7 hours.  Smoking: Never. Alcohol consumption: None.  Dental: UTD with routine dental care.  Vision: UTD with routine vision exams.   Immunization History  Administered Date(s) Administered   Fluad Quad(high Dose 65+) 09/28/2022   Influenza,inj,Quad PF,6+ Mos 10/24/2017, 01/03/2019, 02/03/2020   Influenza-Unspecified 09/10/2013, 09/08/2023   PFIZER(Purple Top)SARS-COV-2 Vaccination 02/26/2020, 03/18/2020, 10/09/2020   PNEUMOCOCCAL CONJUGATE-20 03/27/2023   Pfizer(Comirnaty)Fall Seasonal Vaccine 12 years and older 08/24/2023   Pneumococcal Polysaccharide-23 10/24/2017   Tdap 06/09/2014   Health Maintenance  Topic Date Due   Medicare Annual Wellness (AWV)  03/26/2024   COVID-19 Vaccine (5 - Pfizer risk 2024-25 season) 05/10/2024 (Originally 02/21/2024)   Zoster Vaccines- Shingrix (1 of 2) 12/27/2024 (Originally 05/13/1975)   DTaP/Tdap/Td (2 - Td or Tdap) 06/09/2024   INFLUENZA VACCINE  07/19/2024   MAMMOGRAM  04/18/2026   Colonoscopy  12/26/2027   Pneumonia Vaccine 23+ Years old  Completed   DEXA SCAN  Completed   Hepatitis C Screening  Completed   HPV VACCINES  Aged Out   Meningococcal B Vaccine  Aged Out   Chronic medical problems:   Hypertension Pt is on Amlodipine  5 mg daily and Metoprolol  succinate 25 mg daily.  Has been working on exercise and healthy diet.  She monitors her BP at home and reports average systolic readings in the 120s.  She was previously established with cardiology for abnormal finding on her Echo in 2013.  Per pt, regular follow ups were not necessary.   CKD 3: She has not noted Rhodotorula, foamy urine, or decreased urine output.  Lab Results  Component Value Date   NA 137 03/27/2023   CL 103 03/27/2023   K 4.0 03/27/2023   CO2 26 03/27/2023   BUN 14 03/27/2023   CREATININE 1.19 03/27/2023   GFR 47.86 (L) 03/27/2023   CALCIUM 9.4 03/27/2023   ALBUMIN 4.1 03/27/2023   GLUCOSE 106 (H) 03/27/2023   Vitamin D  deficiency: Pt has been compliant with her vitamin D  supplementation, 1000 U daily.  She is also taking calcium with her multivitamin gummy.   Lab Results  Component Value Date   VD25OH 39.63 03/27/2023   Hyperlipidemia: Currently she is on nonpharmacologic treatment. Component     Latest Ref Rng 03/27/2023  Cholesterol     0 - 200 mg/dL 161   Triglycerides     0.0 - 149.0 mg/dL 09.6   HDL Cholesterol     >39.00 mg/dL 04.54   VLDL     0.0 - 40.0 mg/dL 09.8   LDL (calc)     0 - 99 mg/dL 119 (H)   Total CHOL/HDL Ratio 3   NonHDL 133.01     Pt follows up with endocrinology once annually for management of thyroid  disease.   Hx of renal cancer s/p right nephrectomy. She follows with urologist q 6 months.  Acute concerns today: None.   Review of Systems  Constitutional:  Negative for activity change, appetite change, chills, fever and unexpected weight change.  HENT:  Negative for mouth sores, sore throat and trouble swallowing.   Eyes:  Negative for  redness and visual disturbance.  Respiratory:  Negative for cough, shortness of breath and wheezing.   Cardiovascular:  Negative for chest pain and leg swelling.  Gastrointestinal:  Negative for abdominal pain, nausea and vomiting.  Endocrine: Negative for cold intolerance, heat intolerance, polydipsia, polyphagia and polyuria.  Genitourinary:  Negative for decreased urine volume, dysuria and hematuria.  Musculoskeletal:  Positive for arthralgias. Negative for gait problem.  Skin:  Negative for color change and rash.  Allergic/Immunologic: Negative for environmental  allergies.  Neurological:  Negative for syncope, weakness and headaches.  Hematological:  Negative for adenopathy. Does not bruise/bleed easily.  Psychiatric/Behavioral:  Negative for confusion. The patient is not nervous/anxious.   All other systems reviewed and are negative.  Current Outpatient Medications on File Prior to Visit  Medication Sig Dispense Refill   amLODipine  (NORVASC ) 5 MG tablet TAKE 1 TABLET BY MOUTH EVERY DAY 90 tablet 2   cholecalciferol (VITAMIN D ) 1000 UNITS tablet Take 1,000 Units by mouth daily.     diclofenac  Sodium (VOLTAREN  ARTHRITIS PAIN) 1 % GEL Apply 2 g topically 4 (four) times daily. 150 g 1   famotidine  (PEPCID ) 20 MG tablet Take 1 tablet (20 mg total) by mouth 2 (two) times daily. Please schedule an office visit for further refills. Thank you 180 tablet 0   metoprolol  succinate (TOPROL -XL) 25 MG 24 hr tablet TAKE 1 TABLET BY MOUTH EVERY DAY 90 tablet 2   Multiple Vitamins-Minerals (MULTIVITAMIN PO) Take 1 tablet by mouth daily.      No current facility-administered medications on file prior to visit.   Past Medical History:  Diagnosis Date   AC (acromioclavicular) joint bone spurs    HEEL BONE SPURS-SURGERY 12/2010 LEFT FOOT   Antral gastritis    Arthritis    Cancer of kidney (HCC)    Closed fracture of fifth toe of left foot with malunion    right   DDD (degenerative disc disease), cervical    Difficult airway for intubation, initial encounter 05/28/2013   DL once by CRNA, MAC 3, no view of cords. DL twice by anesthesiologist MAC 3 no view of cords. ); Video Laryngoscope (Good view with glidescope. Cords were anterior)   Esophageal dysmotility    GERD (gastroesophageal reflux disease)    Heart palpitations    Hiatal hernia    Hypertension    Hyperthyroidism    LGSIL (low grade squamous intraepithelial dysplasia) 2013    C&B Inadequate ECC--positive LGSIL --   Multinodular goiter    RIGHT LOBE-- PER BX NEGATIVE   Positive H. pylori test     Thyroid  nodule    Vitamin D  deficiency 11/2007   LOW AT 18   Past Surgical History:  Procedure Laterality Date   ABDOMINAL HYSTERECTOMY N/A 05/28/2013   Procedure: HYSTERECTOMY ABDOMINAL;  Surgeon: Lacretia Piccolo, MD;  Location: [facility];  Service: Gynecology;  Laterality: N/A;   BIOPSY THYROID  Right 2014   ENLARGED THYROID  NO MEDS BENIGN   CERVICAL BIOPSY  W/ LOOP ELECTRODE EXCISION  10/2012   for persistent low-grade dysplasia   CESAREAN SECTION  1993   W/ TUBAL LIGATION, BILATERAL   CYSTOSCOPY N/A 05/28/2013   Procedure: CYSTOSCOPY;  Surgeon: Lacretia Piccolo, MD;  Location: [facility];  Service: Gynecology;  Laterality: N/A;   ESOPHAGOGASTRODUODENOSCOPY (EGD) WITH PROPOFOL  N/A 02/13/2023   Procedure: ESOPHAGOGASTRODUODENOSCOPY (EGD) WITH PROPOFOL ;  Surgeon: Ace Holder, MD;  Location: [facility];  Service: Gastroenterology;  Laterality: N/A;   esophogeal dilatation  FOOT SURGERY  2012   left heel spur removed   HYSTEROSCOPY WITH D & C  06/08/2012   Procedure: DILATATION AND CURETTAGE /HYSTEROSCOPY;  Surgeon: Lacretia Piccolo, MD;  Location: [facility];  Service: Gynecology;  Laterality: N/A;  WITH RESECTION OF MYOMA   NEPHRECTOMY  2005   RIGHT KIDNEY CANCER   ORIF TOE FRACTURE Right 08/15/2018   Procedure: RIGHT FIFTH TOE OSTEOTOMY AND PINNING;  Surgeon: Adah Acron, MD;  Location: [facility];  Service: Orthopedics;  Laterality: Right;   ROTATOR CUFF REPAIR  2010   RIGHT   s/p Right nephrectomy Right    SALPINGOOPHORECTOMY Bilateral 05/28/2013   Procedure: BILATERAL SALPINGO OOPHORECTOMY;  Surgeon: Lacretia Piccolo, MD;  Location: [facility];  Service: Gynecology;  Laterality: Bilateral;   TOE SURGERY  2020   baby toe on right foot   TRANSTHORACIC ECHOCARDIOGRAM  04-10-2012  DR BRIEN CRENSHAW   LVSF MILDLY REDUCED/ EF 45-50%   TUBAL LIGATION     Allergies  Allergen Reactions   Penicillins Rash   Family History  Problem Relation  Age of Onset   Hypertension Mother    Cancer Mother    Hypertension Sister    Hypertension Son    Colon cancer Neg Hx    Esophageal cancer Neg Hx    Rectal cancer Neg Hx    Stomach cancer Neg Hx    Breast cancer Neg Hx    BRCA 1/2 Neg Hx     Social History   Socioeconomic History   Marital status: Married    Spouse name: Not on file   Number of children: 4   Years of education: Not on file   Highest education level: 12th grade  Occupational History   Occupation: [employer]    Employer: [employer]  Tobacco Use   Smoking status: Never   Smokeless tobacco: Never  Vaping Use   Vaping status: Never Used  Substance and Sexual Activity   Alcohol use: Yes    Comment: occasional wine   Drug use: No   Sexual activity: Yes    Partners: Male    Birth control/protection: Post-menopausal, Surgical    Comment: Hyst, First IC >16y/o, <5 Partners  Other Topics Concern   Not on file  Social History Narrative   Not on file   Social Drivers of Health   Financial Resource Strain: Patient Declined (01/08/2024)   Overall Financial Resource Strain (CARDIA)    Difficulty of Paying Living Expenses: Patient declined  Food Insecurity: Food Insecurity Present (01/08/2024)   Hunger Vital Sign    Worried About Running Out of Food in the Last Year: Patient declined    Ran Out of Food in the Last Year: Sometimes true  Transportation Needs: No Transportation Needs (01/08/2024)   PRAPARE - Administrator, Civil Service (Medical): No    Lack of Transportation (Non-Medical): No  Physical Activity: Insufficiently Active (01/08/2024)   Exercise Vital Sign    Days of Exercise per Week: 2 days    Minutes of Exercise per Session: 10 min  Stress: No Stress Concern Present (01/08/2024)   [employer] of Occupational Health - Occupational Stress Questionnaire    Feeling of Stress : Not at all  Social Connections: Moderately Integrated (01/08/2024)   Social Connection and Isolation  Panel [NHANES]    Frequency of Communication with Friends and Family: More than three times a week    Frequency of Social Gatherings with Friends  and Family: More than three times a week    Attends Religious Services: More than 4 times per year    Active Member of Clubs or Organizations: No    Attends Banker Meetings: Not on file    Marital Status: Married   Vitals:   04/29/24 0946  BP: 120/70  Pulse: 60  Resp: 16  Temp: 98.1 F (36.7 C)  SpO2: 99%   Body mass index is 36.18 kg/m.  Wt Readings from Last 3 Encounters:  04/29/24 204 lb 4 oz (92.6 kg)  01/09/24 196 lb 8 oz (89.1 kg)  01/03/24 197 lb 6.4 oz (89.5 kg)   Physical Exam Vitals and nursing note reviewed.  Constitutional:      General: She is not in acute distress.    Appearance: She is well-developed.  HENT:     Head: Normocephalic and atraumatic.     Right Ear: Hearing, tympanic membrane, ear canal and external ear normal.     Left Ear: Hearing, tympanic membrane, ear canal and external ear normal.     Mouth/Throat:     Mouth: Mucous membranes are moist.     Pharynx: Oropharynx is clear. Uvula midline.  Eyes:     Extraocular Movements: Extraocular movements intact.     Conjunctiva/sclera: Conjunctivae normal.     Pupils: Pupils are equal, round, and reactive to light.  Neck:     Thyroid : Thyromegaly present. No thyroid  mass.  Cardiovascular:     Rate and Rhythm: Normal rate and regular rhythm.     Pulses:          Dorsalis pedis pulses are 2+ on the right side and 2+ on the left side.     Heart sounds: No murmur heard. Pulmonary:     Effort: Pulmonary effort is normal. No respiratory distress.     Breath sounds: Normal breath sounds.  Abdominal:     Palpations: Abdomen is soft. There is no hepatomegaly or mass.     Tenderness: There is no abdominal tenderness.  Genitourinary:    Comments: Deferred to gyn. Musculoskeletal:     Comments: No signs of synovitis appreciated.   Lymphadenopathy:     Cervical: No cervical adenopathy.     Upper Body:     Right upper body: No supraclavicular adenopathy.     Left upper body: No supraclavicular adenopathy.  Skin:    General: Skin is warm.     Findings: No erythema or rash.  Neurological:     General: No focal deficit present.     Mental Status: She is alert and oriented to person, place, and time.     Cranial Nerves: No cranial nerve deficit.     Coordination: Coordination normal.     Gait: Gait normal.     Deep Tendon Reflexes:     Reflex Scores:      Bicep reflexes are 2+ on the right side and 2+ on the left side.      Patellar reflexes are 2+ on the right side and 2+ on the left side. Psychiatric:        Mood and Affect: Mood and affect normal.   ASSESSMENT AND PLAN: Ms. Cynthia Berger was here today annual physical examination.  Orders Placed This Encounter  Procedures   Comprehensive metabolic panel with GFR   Lipid panel   VITAMIN D  25 Hydroxy (Vit-D Deficiency, Fractures)   Microalbumin / creatinine urine ratio   Amb Ref to Medical Weight Management   Lab Results  Component Value Date   VD25OH 53.52 04/29/2024   Lab Results  Component Value Date   CHOL 183 04/29/2024   HDL 52.00 04/29/2024   LDLCALC 115 (H) 04/29/2024   TRIG 78.0 04/29/2024   CHOLHDL 4 04/29/2024   The 10-year ASCVD risk score (Arnett DK, et al., 2019) is: 7.8%   Values used to calculate the score:     Age: 38 years     Sex: Female     Is Non-Hispanic African American: Yes     Diabetic: No     Tobacco smoker: No     Systolic Blood Pressure: 120 mmHg     Is BP treated: Yes     HDL Cholesterol: 52 mg/dL     Total Cholesterol: 183 mg/dL Lab Results  Component Value Date   NA 139 04/29/2024   CL 106 04/29/2024   K 4.4 04/29/2024   CO2 25 04/29/2024   BUN 18 04/29/2024   CREATININE 1.15 04/29/2024   GFR 49.48 (L) 04/29/2024   CALCIUM 9.3 04/29/2024   ALBUMIN 4.0 04/29/2024   GLUCOSE 89 04/29/2024   Lab Results   Component Value Date   ALT 16 04/29/2024   AST 19 04/29/2024   ALKPHOS 85 04/29/2024   BILITOT 0.3 04/29/2024   Lab Results  Component Value Date   MICROALBUR <0.7 04/29/2024   MICROALBUR <0.7 03/27/2023   Routine general medical examination at a health care facility Assessment & Plan: We discussed the importance of regular physical activity and healthy diet for prevention of chronic illness and/or complications. Preventive guidelines reviewed. Vaccination up to date. Ca++ and vit D supplementation to continue. Continue appointment preventive care with gynecologist. Next CPE in a year.   Hyperlipidemia, unspecified hyperlipidemia type Assessment & Plan: Non pharmacologic treatment recommended for now. Further recommendations will be given according to 10 years CVD risk score and lipid panel numbers.  Orders: -     Lipid panel; Future  Stage 3a chronic kidney disease (HCC) Assessment & Plan: Probably has been stable, last creatinine 1.19 and EGFR 47 in 03/2023. Continue adequate hydration, low-salt diet, and avoidance of NSAIDs. BP is adequately controlled. She is not on ACE inhibitor's or ARB's. As far as problem is stable, we will continue annual follow-up.  Orders: -     Comprehensive metabolic panel with GFR; Future -     Microalbumin / creatinine urine ratio; Future  Benign essential hypertension Assessment & Plan: BP adequately controlled, reporting similar readings at home. Continue amlodipine  5 mg daily and metoprolol  succinate 25 mg daily as well as low-salt diet. Eye exam is current. As far as BP at home is stable, annual follow up is appropriate.   Vitamin D  deficiency, unspecified Assessment & Plan: Continue vitamin D  at 1000 units daily + daily multivitamin. Further recommendation will be given according to 25 OH vitamin D  result.  Orders: -     VITAMIN D  25 Hydroxy (Vit-D Deficiency, Fractures); Future  Malignant neoplasm of kidney excluding  renal pelvis, unspecified laterality [facility]) Assessment & Plan: Status post right nephrectomy in 2005. Following with urologist every 6 months.   HFrEF (heart failure with reduced ejection fraction) (HCC) Assessment & Plan: Euvolemic. Mildly decreased LVEF in 03/2012. MUGA study 05/2012 with LVEF 68%. She is not longer following with cardiologist.   Morbid obesity (HCC) BMI 36 and hx of HLD,GERD,HTN Assessment & Plan: Patient understands the benefits of wt loss as well as adverse effects of obesity. Consistency with healthy diet and physical activity  encouraged. She agrees with referral to Healthy wt and wellness clinic.  Orders: -     Amb Ref to Medical Weight Management  Return in 1 year (on 04/29/2025) for CPE, chronic problems.  I, Bernita Bristle, acting as a scribe for Adley Mazurowski [place], MD., have documented all relevant documentation on the behalf of Cynthia Hambly [place], MD, as directed by   while in the presence of Cynthia Yielding [place], MD.  I, Rusti Arizmendi [place], MD, have reviewed all documentation for this visit. The documentation on 04/29/24 for the exam, diagnosis, procedures, and orders are all accurate and complete.  Adorian Gwynne G. [place], MD  [facility]. [place] office.

## 2024-04-29 NOTE — Assessment & Plan Note (Signed)
 Non pharmacologic treatment recommended for now. Further recommendations will be given according to 10 years CVD risk score and lipid panel numbers.

## 2024-05-08 ENCOUNTER — Ambulatory Visit

## 2024-05-08 VITALS — Ht 63.0 in | Wt 204.0 lb

## 2024-05-08 DIAGNOSIS — Z0000 Encounter for general adult medical examination without abnormal findings: Secondary | ICD-10-CM

## 2024-05-08 NOTE — Patient Instructions (Addendum)
 Ms. Cynthia Berger , Thank you for taking time out of your busy schedule to complete your Annual Wellness Visit with me. I enjoyed our conversation and look forward to speaking with you again next year. I, as well as your care team,  appreciate your ongoing commitment to your health goals. Please review the following plan we discussed and let me know if I can assist you in the future. Your Game plan/ To Do List    Referrals: If you haven't heard from the office you've been referred to, please reach out to them at the phone provided.   Follow up Visits: Next Medicare AWV with our clinical staff: 05/16/25 @ 1p   Have you seen your provider in the last 6 months (3 months if uncontrolled diabetes)? 04/29/24 Next Office Visit with your provider:   Clinician Recommendations:  Aim for 30 minutes of exercise or brisk walking, 6-8 glasses of water , and 5 servings of fruits and vegetables each day.       This is a list of the screening recommended for you and due dates:  Health Maintenance  Topic Date Due   COVID-19 Vaccine (5 - Pfizer risk 2024-25 season) 05/10/2024*   Zoster (Shingles) Vaccine (1 of 2) 12/27/2024*   DTaP/Tdap/Td vaccine (2 - Td or Tdap) 06/09/2024   Flu Shot  07/19/2024   Medicare Annual Wellness Visit  05/08/2025   Mammogram  04/18/2026   Colon Cancer Screening  12/26/2027   Pneumonia Vaccine  Completed   DEXA scan (bone density measurement)  Completed   Hepatitis C Screening  Completed   HPV Vaccine  Aged Out   Meningitis B Vaccine  Aged Out  *Topic was postponed. The date shown is not the original due date.    Advanced directives: (Declined) Advance directive discussed with you today. Even though you declined this today, please call our office should you change your mind, and we can give you the proper paperwork for you to fill out. Advance Care Planning is important because it:  [x]  Makes sure you receive the medical care that is consistent with your values, goals, and  preferences  [x]  It provides guidance to your family and loved ones and reduces their decisional burden about whether or not they are making the right decisions based on your wishes.  Follow the link provided in your after visit summary or read over the paperwork we have mailed to you to help you started getting your Advance Directives in place. If you need assistance in completing these, please reach out to us  so that we can help you!  See attachments for Preventive Care and Fall Prevention Tips.

## 2024-05-08 NOTE — Progress Notes (Addendum)
 Subjective:   Cynthia Berger is a 68 y.o. who presents for a Medicare Wellness preventive visit.  As a reminder, Annual Wellness Visits don't include a physical exam, and some assessments may be limited, especially if this visit is performed virtually. We may recommend an in-person follow-up visit with your provider if needed.  Visit Complete: Virtual I connected with  Cynthia Berger on 05/20/24 by a audio enabled telemedicine application and verified that I am speaking with the correct person using two identifiers.  Patient Location: Home  Provider Location: Home Office  I discussed the limitations of evaluation and management by telemedicine. The patient expressed understanding and agreed to proceed.  Vital Signs: Because this visit was a virtual/telehealth visit, some criteria may be missing or patient reported. Any vitals not documented were not able to be obtained and vitals that have been documented are patient reported.    Persons Participating in Visit: Patient.  AWV Questionnaire: No: Patient Medicare AWV questionnaire was not completed prior to this visit.  Cardiac Risk Factors include: advanced age (>27men, >2 women);hypertension     Objective:     Today's Vitals   05/08/24 1401  Weight: 204 lb (92.5 kg)  Height: 5\' 3"  (1.6 m)   Body mass index is 36.14 kg/m.     05/08/2024    2:05 PM 02/13/2023    8:00 AM 03/10/2021    9:03 AM 12/12/2020   11:25 AM 08/15/2018   11:52 AM 08/14/2018   10:57 AM 10/02/2014    8:02 PM  Advanced Directives  Does Patient Have a Medical Advance Directive? No No No No No No No  Would patient like information on creating a medical advance directive? No - Patient declined No - Patient declined No - Patient declined  No - Patient declined No - Patient declined No - patient declined information    Current Medications (verified) Outpatient Encounter Medications as of 05/08/2024  Medication Sig   amLODipine  (NORVASC ) 5 MG tablet TAKE 1  TABLET BY MOUTH EVERY DAY   cholecalciferol (VITAMIN D ) 1000 UNITS tablet Take 1,000 Units by mouth daily.   diclofenac  Sodium (VOLTAREN  ARTHRITIS PAIN) 1 % GEL Apply 2 g topically 4 (four) times daily.   famotidine  (PEPCID ) 20 MG tablet Take 1 tablet (20 mg total) by mouth 2 (two) times daily. Please schedule an office visit for further refills. Thank you   metoprolol  succinate (TOPROL -XL) 25 MG 24 hr tablet Take 1 tablet (25 mg total) by mouth daily.   Multiple Vitamins-Minerals (MULTIVITAMIN PO) Take 1 tablet by mouth daily.    No facility-administered encounter medications on file as of 05/08/2024.    Allergies (verified) Penicillins   History: Past Medical History:  Diagnosis Date   AC (acromioclavicular) joint bone spurs    HEEL BONE SPURS-SURGERY 12/2010 LEFT FOOT   Antral gastritis    Arthritis    Cancer of kidney (HCC)    Closed fracture of fifth toe of left foot with malunion    right   DDD (degenerative disc disease), cervical    Difficult airway for intubation, initial encounter 05/28/2013   DL once by CRNA, MAC 3, no view of cords. DL twice by anesthesiologist MAC 3 no view of cords. ); Video Laryngoscope (Good view with glidescope. Cords were anterior)   Esophageal dysmotility    GERD (gastroesophageal reflux disease)    Heart palpitations    Hiatal hernia    Hypertension    Hyperthyroidism    LGSIL (low grade squamous intraepithelial  dysplasia) 2013    C&B Inadequate ECC--positive LGSIL --   Multinodular goiter    RIGHT LOBE-- PER BX NEGATIVE   Positive H. pylori test    Thyroid  nodule    Vitamin D  deficiency 11/2007   LOW AT 18   Past Surgical History:  Procedure Laterality Date   ABDOMINAL HYSTERECTOMY N/A 05/28/2013   Procedure: HYSTERECTOMY ABDOMINAL;  Surgeon: Lacretia Piccolo, MD;  Location: [facility];  Service: Gynecology;  Laterality: N/A;   BIOPSY THYROID  Right 2014   ENLARGED THYROID  NO MEDS BENIGN   CERVICAL BIOPSY  W/ LOOP ELECTRODE EXCISION   10/2012   for persistent low-grade dysplasia   CESAREAN SECTION  1993   W/ TUBAL LIGATION, BILATERAL   CYSTOSCOPY N/A 05/28/2013   Procedure: CYSTOSCOPY;  Surgeon: Lacretia Piccolo, MD;  Location: [facility];  Service: Gynecology;  Laterality: N/A;   ESOPHAGOGASTRODUODENOSCOPY (EGD) WITH PROPOFOL  N/A 02/13/2023   Procedure: ESOPHAGOGASTRODUODENOSCOPY (EGD) WITH PROPOFOL ;  Surgeon: Ace Holder, MD;  Location: [facility];  Service: Gastroenterology;  Laterality: N/A;   esophogeal dilatation     FOOT SURGERY  2012   left heel spur removed   HYSTEROSCOPY WITH D & C  06/08/2012   Procedure: DILATATION AND CURETTAGE /HYSTEROSCOPY;  Surgeon: Lacretia Piccolo, MD;  Location: [facility];  Service: Gynecology;  Laterality: N/A;  WITH RESECTION OF MYOMA   NEPHRECTOMY  2005   RIGHT KIDNEY CANCER   ORIF TOE FRACTURE Right 08/15/2018   Procedure: RIGHT FIFTH TOE OSTEOTOMY AND PINNING;  Surgeon: Adah Acron, MD;  Location: [facility];  Service: Orthopedics;  Laterality: Right;   ROTATOR CUFF REPAIR  2010   RIGHT   s/p Right nephrectomy Right    SALPINGOOPHORECTOMY Bilateral 05/28/2013   Procedure: BILATERAL SALPINGO OOPHORECTOMY;  Surgeon: Lacretia Piccolo, MD;  Location: [facility];  Service: Gynecology;  Laterality: Bilateral;   TOE SURGERY  2020   baby toe on right foot   TRANSTHORACIC ECHOCARDIOGRAM  04-10-2012  DR BRIEN CRENSHAW   LVSF MILDLY REDUCED/ EF 45-50%   TUBAL LIGATION     Family History  Problem Relation Age of Onset   Hypertension Mother    Cancer Mother    Hypertension Sister    Hypertension Son    Colon cancer Neg Hx    Esophageal cancer Neg Hx    Rectal cancer Neg Hx    Stomach cancer Neg Hx    Breast cancer Neg Hx    BRCA 1/2 Neg Hx    Social History   Socioeconomic History   Marital status: Married    Spouse name: Not on file   Number of children: 4   Years of education: Not on file   Highest education level: 12th grade   Occupational History   Occupation: [employer]    Employer: [employer]  Tobacco Use   Smoking status: Never   Smokeless tobacco: Never  Vaping Use   Vaping status: Never Used  Substance and Sexual Activity   Alcohol use: Yes    Comment: occasional wine   Drug use: No   Sexual activity: Yes    Partners: Male    Birth control/protection: Post-menopausal, Surgical    Comment: Hyst, First IC >16y/o, <5 Partners  Other Topics Concern   Not on file  Social History Narrative   Not on file   Social Drivers of Health   Financial Resource Strain: Low Risk  (05/08/2024)   Overall Physicist, medical Strain (  CARDIA)    Difficulty of Paying Living Expenses: Not hard at all  Food Insecurity: No Food Insecurity (05/08/2024)   Hunger Vital Sign    Worried About Running Out of Food in the Last Year: Never true    Ran Out of Food in the Last Year: Never true  Transportation Needs: No Transportation Needs (05/08/2024)   PRAPARE - Administrator, Civil Service (Medical): No    Lack of Transportation (Non-Medical): No  Physical Activity: Insufficiently Active (05/08/2024)   Exercise Vital Sign    Days of Exercise per Week: 2 days    Minutes of Exercise per Session: 30 min  Stress: No Stress Concern Present (05/08/2024)   [employer] of Occupational Health - Occupational Stress Questionnaire    Feeling of Stress : Not at all  Social Connections: Socially Integrated (05/08/2024)   Social Connection and Isolation Panel [NHANES]    Frequency of Communication with Friends and Family: More than three times a week    Frequency of Social Gatherings with Friends and Family: More than three times a week    Attends Religious Services: More than 4 times per year    Active Member of [employer] or Organizations: Yes    Attends Engineer, structural: More than 4 times per year    Marital Status: Married    Tobacco Counseling Counseling given: Not Answered    Clinical  Intake:  Pre-visit preparation completed: Yes  Pain : No/denies pain     BMI - recorded: 36.14 Nutritional Status: BMI > 30  Obese Nutritional Risks: None Diabetes: No  Lab Results  Component Value Date   HGBA1C 5.5 04/15/2022   HGBA1C 5.5 02/03/2021   HGBA1C 5.0 02/03/2020     How often do you need to have someone help you when you read instructions, pamphlets, or other written materials from your doctor or pharmacy?: 1 - Never  Interpreter Needed?: No  Information entered by :: Farris Hong LPN   Activities of Daily Living     05/08/2024    2:05 PM  In your present state of health, do you have any difficulty performing the following activities:  Hearing? 0  Vision? 0  Difficulty concentrating or making decisions? 0  Walking or climbing stairs? 0  Dressing or bathing? 0  Doing errands, shopping? 0  Preparing Food and eating ? N  Using the Toilet? N  In the past six months, have you accidently leaked urine? N  Do you have problems with loss of bowel control? N  Managing your Medications? N  Managing your Finances? N  Housekeeping or managing your Housekeeping? N    Patient Care Team: [place], Betty G, MD as PCP - General (Family Medicine)  Indicate any recent Medical Services you may have received from other than [facility] providers in the past year (date may be approximate).     Assessment:    This is a routine wellness examination for Cynthia Berger.  Hearing/Vision screen Hearing Screening - Comments:: Denies hearing difficulties   Vision Screening - Comments:: Wears rx glasses - up to date with routine eye exams with  [facility]   Goals Addressed               This Visit's Progress     Increase physical activity (pt-stated)         Depression Screen     05/08/2024    2:04 PM 04/29/2024    9:51 AM 01/03/2024   11:36 AM 03/27/2023  2:29 PM 04/15/2022    9:18 AM 02/03/2021    9:13 AM 10/24/2017    8:05 AM  PHQ 2/9 Scores  PHQ - 2 Score 0 0 0 0 0  0 0  PHQ- 9 Score   0        Fall Risk     05/08/2024    2:05 PM 01/03/2024   11:36 AM 03/27/2023    2:29 PM 02/02/2023   10:59 AM 04/15/2022    9:18 AM  Fall Risk   Falls in the past year? 0 0 0 0 0  Number falls in past yr: 0 0 0 0 0  Injury with Fall? 0 0 0 0 0  Risk for fall due to : No Fall Risks  No Fall Risks  No Fall Risks  Follow up Falls evaluation completed Falls evaluation completed Falls evaluation completed  Falls evaluation completed    MEDICARE RISK AT HOME:  Medicare Risk at Home Any stairs in or around the home?: Yes If so, are there any without handrails?: No Home free of loose throw rugs in walkways, pet beds, electrical cords, etc?: Yes Adequate lighting in your home to reduce risk of falls?: Yes Life alert?: No Use of a cane, walker or w/c?: No Grab bars in the bathroom?: No Shower chair or bench in shower?: No Elevated toilet seat or a handicapped toilet?: No  TIMED UP AND GO:  Was the test performed?  No  Cognitive Function: 6CIT completed        05/08/2024    2:06 PM  6CIT Screen  What Year? 0 points  What month? 0 points  What time? 0 points  Count back from 20 0 points  Months in reverse 4 points  Repeat phrase 0 points  Total Score 4 points    Immunizations Immunization History  Administered Date(s) Administered   Fluad Quad(high Dose 65+) 09/28/2022   Influenza,inj,Quad PF,6+ Mos 10/24/2017, 01/03/2019, 02/03/2020   Influenza-Unspecified 09/10/2013, 09/08/2023   PFIZER(Purple Top)SARS-COV-2 Vaccination 02/26/2020, 03/18/2020, 10/09/2020   PNEUMOCOCCAL CONJUGATE-20 03/27/2023   Pfizer(Comirnaty)Fall Seasonal Vaccine 12 years and older 08/24/2023   Pneumococcal Polysaccharide-23 10/24/2017   Tdap 06/09/2014    Screening Tests Health Maintenance  Topic Date Due   COVID-19 Vaccine (5 - Pfizer risk 2024-25 season) 02/21/2024   Zoster Vaccines- Shingrix (1 of 2) 12/27/2024 (Originally 05/13/1975)   DTaP/Tdap/Td (2 - Td or Tdap)  06/09/2024   INFLUENZA VACCINE  07/19/2024   Medicare Annual Wellness (AWV)  05/08/2025   MAMMOGRAM  04/18/2026   Colonoscopy  12/26/2027   Pneumonia Vaccine 4+ Years old  Completed   DEXA SCAN  Completed   Hepatitis C Screening  Completed   HPV VACCINES  Aged Out   Meningococcal B Vaccine  Aged Out    Health Maintenance  Health Maintenance Due  Topic Date Due   COVID-19 Vaccine (5 - Pfizer risk 2024-25 season) 02/21/2024   Health Maintenance Items Addressed:   Additional Screening:  Vision Screening: Recommended annual ophthalmology exams for early detection of glaucoma and other disorders of the eye.  Dental Screening: Recommended annual dental exams for proper oral hygiene  Community Resource Referral / Chronic Care Management: CRR required this visit?  No   CCM required this visit?  No   Plan:    I have personally reviewed and noted the following in the patient's chart:   Medical and social history Use of alcohol, tobacco or illicit drugs  Current medications and supplements including opioid prescriptions.  Patient is not currently taking opioid prescriptions. Functional ability and status Nutritional status Physical activity Advanced directives List of other physicians Hospitalizations, surgeries, and ER visits in previous 12 months Vitals Screenings to include cognitive, depression, and falls Referrals and appointments  In addition, I have reviewed and discussed with patient certain preventive protocols, quality metrics, and best practice recommendations. A written personalized care plan for preventive services as well as general preventive health recommendations were provided to patient.   Dewayne Ford, LPN   12/24/1094   After Visit Summary: (MyChart) Due to this being a telephonic visit, the after visit summary with patients personalized plan was offered to patient via MyChart   Notes: Nothing significant to report at this time.

## 2024-05-29 ENCOUNTER — Other Ambulatory Visit: Payer: Self-pay | Admitting: Gastroenterology

## 2024-06-20 ENCOUNTER — Other Ambulatory Visit: Payer: Self-pay | Admitting: Gastroenterology

## 2024-07-02 ENCOUNTER — Institutional Professional Consult (permissible substitution) (INDEPENDENT_AMBULATORY_CARE_PROVIDER_SITE_OTHER): Admitting: Physician Assistant

## 2024-07-25 ENCOUNTER — Institutional Professional Consult (permissible substitution) (INDEPENDENT_AMBULATORY_CARE_PROVIDER_SITE_OTHER): Admitting: Family Medicine

## 2024-07-29 DIAGNOSIS — C641 Malignant neoplasm of right kidney, except renal pelvis: Secondary | ICD-10-CM | POA: Diagnosis not present

## 2024-08-27 ENCOUNTER — Encounter: Payer: Self-pay | Admitting: Family Medicine

## 2024-08-27 ENCOUNTER — Ambulatory Visit (INDEPENDENT_AMBULATORY_CARE_PROVIDER_SITE_OTHER): Admitting: Family Medicine

## 2024-08-27 VITALS — BP 152/83 | HR 65 | Temp 98.2°F | Ht 62.5 in | Wt 199.0 lb

## 2024-08-27 DIAGNOSIS — E66812 Obesity, class 2: Secondary | ICD-10-CM

## 2024-08-27 DIAGNOSIS — I110 Hypertensive heart disease with heart failure: Secondary | ICD-10-CM

## 2024-08-27 DIAGNOSIS — I10 Essential (primary) hypertension: Secondary | ICD-10-CM

## 2024-08-27 DIAGNOSIS — Z6835 Body mass index (BMI) 35.0-35.9, adult: Secondary | ICD-10-CM

## 2024-08-27 DIAGNOSIS — I5020 Unspecified systolic (congestive) heart failure: Secondary | ICD-10-CM

## 2024-08-27 NOTE — Progress Notes (Signed)
 Office: [phone]  /  Fax: [phone]   Initial Visit  Cynthia Berger was seen in clinic today to evaluate for obesity. She is interested in losing weight to improve overall health and reduce the risk of weight related complications. She presents today to review program treatment options, initial physical assessment, and evaluation.     She was referred by: PCP  When asked what else they would like to accomplish? She states: Adopt a healthier eating pattern and lifestyle, Improve energy levels and physical activity, Improve existing medical conditions, Reduce number of medications, and Improve quality of life.  Would like to get down to 150 lb (was there 5 years ago).  Weight history:  weight up and down 10 lb.  She has gotten as low at 170 lb with diet and exercise.  Working part time, less active at work.  Lives w/ mom and her husband.  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  Her husband is a big junk food eater.  She is eating more later at night while sewing.    When asked how has your weight affected you? She states: Contributed to medical problems, Contributed to orthopedic problems or mobility issues, and Problems with eating patterns  Some associated conditions: Hypertension, GERD, Heart Failure, and Kidney disease hx of RCC- s/p R nephrectomy  Contributing factors: family history of obesity, reduced physical activity, chronic skipping of meals, and multiple weight loss attempts in the past  Weight promoting medications identified: None  Current nutrition plan: None  Current level of physical activity: NEAT  Current or previous pharmacotherapy: None  Response to medication: Never tried medications   Past medical history includes:   Past Medical History:  Diagnosis Date   AC (acromioclavicular) joint bone spurs    HEEL BONE SPURS-SURGERY 12/2010 LEFT FOOT   Antral gastritis    Arthritis    Cancer of kidney (HCC)    Closed fracture of fifth toe of left foot with malunion     right   DDD (degenerative disc disease), cervical    Difficult airway for intubation, initial encounter 05/28/2013   DL once by CRNA, MAC 3, no view of cords. DL twice by anesthesiologist MAC 3 no view of cords. ); Video Laryngoscope (Good view with glidescope. Cords were anterior)   Esophageal dysmotility    GERD (gastroesophageal reflux disease)    Heart palpitations    Hiatal hernia    Hypertension    Hyperthyroidism    LGSIL (low grade squamous intraepithelial dysplasia) 2013    C&B Inadequate ECC--positive LGSIL --   Multinodular goiter    RIGHT LOBE-- PER BX NEGATIVE   Positive H. pylori test    Thyroid  nodule    Vitamin D  deficiency 11/2007   LOW AT 17     Objective:   BP (!) 152/83   Pulse 65   Temp 98.2 F (36.8 C)   Ht 5' 2.5 (1.588 m)   Wt 199 lb (90.3 kg)   LMP 08/23/2006   SpO2 97%   BMI 35.82 kg/m  She was weighed on the bioimpedance scale: Body mass index is 35.82 kg/m.  Peak Weight:199 , Body Fat%:46.2, Visceral Fat Rating:14, Weight trend over the last 12 months: Unchanged  General:  Alert, oriented and cooperative. Patient is in no acute distress.  Respiratory: Normal respiratory effort, no problems with respiration noted   Gait: able to ambulate independently  Mental Status: Normal mood and affect. Normal behavior. Normal judgment and thought content.   DIAGNOSTIC DATA  REVIEWED:  BMET    Component Value Date/Time   NA 139 04/29/2024 1026   K 4.4 04/29/2024 1026   CL 106 04/29/2024 1026   CO2 25 04/29/2024 1026   GLUCOSE 89 04/29/2024 1026   BUN 18 04/29/2024 1026   CREATININE 1.15 04/29/2024 1026   CALCIUM 9.3 04/29/2024 1026   CALCIUM 9.1 10/11/2011 1539   GFRNONAA 43 (L) 03/10/2021 0909   GFRAA 52 (L) 05/16/2013 1205   Lab Results  Component Value Date   HGBA1C 5.5 04/15/2022   HGBA1C 5.0 02/03/2020   No results found for: INSULIN CBC    Component Value Date/Time   WBC 6.5 03/27/2023 1525   RBC 4.41 03/27/2023 1525   HGB  12.9 03/27/2023 1525   HCT 39.2 03/27/2023 1525   PLT 270.0 03/27/2023 1525   MCV 88.8 03/27/2023 1525   MCH 29.2 03/10/2021 0909   MCHC 32.9 03/27/2023 1525   RDW 14.0 03/27/2023 1525   Iron/TIBC/Ferritin/ %Sat No results found for: IRON, TIBC, FERRITIN, IRONPCTSAT Lipid Panel     Component Value Date/Time   CHOL 183 04/29/2024 1026   TRIG 78.0 04/29/2024 1026   HDL 52.00 04/29/2024 1026   CHOLHDL 4 04/29/2024 1026   VLDL 15.6 04/29/2024 1026   LDLCALC 115 (H) 04/29/2024 1026   Hepatic Function Panel     Component Value Date/Time   PROT 7.6 04/29/2024 1026   ALBUMIN 4.0 04/29/2024 1026   AST 19 04/29/2024 1026   ALT 16 04/29/2024 1026   ALKPHOS 85 04/29/2024 1026   BILITOT 0.3 04/29/2024 1026   BILIDIR 0.0 06/09/2014 1651      Component Value Date/Time   TSH 1.80 12/22/2023 0912     Assessment and Plan:   Benign essential hypertension Blood pressure is elevated today. She is taking amlodopine 5 mg daily and Toprol  XL 25 mg daily, managed by PCP Continue current medications. Watch for a reduction in BP with weight loss  Class 2 severe obesity due to excess calories with serious comorbidity and body mass index (BMI) of 35.0 to 35.9 in adult (HCC)  HFrEF (heart failure with reduced ejection fraction) (HCC) She has a hx of heart failure, on a daily beta blocker. She does not follow with cardiology Denies recent issues with leg edema, DOE or CP Plan for EKG next visit     Obesity Treatment / Action Plan:  Patient will work on garnering support from family and friends to begin weight loss journey. Will work on eliminating or reducing the presence of highly palatable, calorie dense foods in the home. Will complete provided nutritional and psychosocial assessment questionnaire before the next appointment. Will be scheduled for indirect calorimetry to determine resting energy expenditure in a fasting state.  This will allow us  to create a reduced calorie,  high-protein meal plan to promote loss of fat mass while preserving muscle mass. Will think about ideas on how to incorporate physical activity into their daily routine. Counseled on the health benefits of losing 5%-15% of total body weight. Was counseled on nutritional approaches to weight loss and benefits of reducing processed foods and consuming plant-based foods and high quality protein as part of nutritional weight management. Was counseled on pharmacotherapy and role as an adjunct in weight management.   Obesity Education Performed Today:  She was weighed on the bioimpedance scale and results were discussed and documented in the synopsis.  We discussed obesity as a disease and the importance of a more detailed evaluation of all the  factors contributing to the disease.  We discussed the importance of long term lifestyle changes which include nutrition, exercise and behavioral modifications as well as the importance of customizing this to her specific health and social needs.  We discussed the benefits of reaching a healthier weight to alleviate the symptoms of existing conditions and reduce the risks of the biomechanical, metabolic and psychological effects of obesity.  Tessi Rowles appears to be in the action stage of change and states they are ready to start intensive lifestyle modifications and behavioral modifications.  20 minutes was spent today on this visit including the above counseling, pre-visit chart review, and post-visit documentation.  Reviewed by clinician on day of visit: allergies, medications, problem list, medical history, surgical history, family history, social history, and previous encounter notes pertinent to obesity diagnosis.    Darice Haddock, D.O. DABFM, [facility] [address] [place], [place] [address] [phone]

## 2024-09-19 ENCOUNTER — Ambulatory Visit: Admitting: Family Medicine

## 2024-09-19 ENCOUNTER — Encounter: Payer: Self-pay | Admitting: Family Medicine

## 2024-09-19 VITALS — BP 137/75 | HR 59 | Temp 97.8°F | Ht 62.5 in | Wt 202.0 lb

## 2024-09-19 DIAGNOSIS — N183 Chronic kidney disease, stage 3 unspecified: Secondary | ICD-10-CM | POA: Diagnosis not present

## 2024-09-19 DIAGNOSIS — N1831 Chronic kidney disease, stage 3a: Secondary | ICD-10-CM

## 2024-09-19 DIAGNOSIS — R0602 Shortness of breath: Secondary | ICD-10-CM | POA: Diagnosis not present

## 2024-09-19 DIAGNOSIS — I5020 Unspecified systolic (congestive) heart failure: Secondary | ICD-10-CM | POA: Diagnosis not present

## 2024-09-19 DIAGNOSIS — I130 Hypertensive heart and chronic kidney disease with heart failure and stage 1 through stage 4 chronic kidney disease, or unspecified chronic kidney disease: Secondary | ICD-10-CM | POA: Diagnosis not present

## 2024-09-19 DIAGNOSIS — I10 Essential (primary) hypertension: Secondary | ICD-10-CM

## 2024-09-19 DIAGNOSIS — R635 Abnormal weight gain: Secondary | ICD-10-CM

## 2024-09-19 DIAGNOSIS — R5383 Other fatigue: Secondary | ICD-10-CM

## 2024-09-19 DIAGNOSIS — Z6836 Body mass index (BMI) 36.0-36.9, adult: Secondary | ICD-10-CM | POA: Diagnosis not present

## 2024-09-19 DIAGNOSIS — E66812 Obesity, class 2: Secondary | ICD-10-CM

## 2024-09-19 NOTE — Progress Notes (Signed)
 At a Glance:  Vitals Temp: 97.8 F (36.6 C) BP: 137/75 Pulse Rate: (!) 59 SpO2: 99 %   Anthropometric Measurements Height: 5' 2.5 (1.588 m) Weight: 202 lb (91.6 kg) BMI (Calculated): 36.33 Starting Weight: 202lb Peak Weight: 199lb   Body Composition  Body Fat %: 47.6 % Fat Mass (lbs): 96.6 lbs Muscle Mass (lbs): 100.8 lbs Total Body Water  (lbs): 80.8 lbs Visceral Fat Rating : 15   Other Clinical Data RMR: 1195 Fasting: yes Labs: yes Today's Visit #: 1 Starting Date: 09/19/24    EKG: Normal sinus rhythm, rate 60.  Indirect Calorimeter completed today shows a VO2 of 174 and a REE of 1195.  Her calculated basal metabolic rate is 8501 thus her basal metabolic rate is worse than expected.  Chief Complaint:  Obesity   Subjective:  Cynthia Berger (MR# [id number]) is a 68 y.o. female who presents for evaluation and treatment of obesity and related comorbidities.   Cynthia Berger is currently in the action stage of change and ready to dedicate time achieving and maintaining a healthier weight. Cynthia Berger is interested in becoming our patient and working on intensive lifestyle modifications including (but not limited to) diet and exercise for weight loss.  Cynthia Berger has been struggling with her weight. She has been unsuccessful in either losing weight, maintaining weight loss, or reaching her healthy weight goal.  She lives with her mom and her husband.  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  She has been snacking more at night, often skipping breakfast.  She is craving more sweets.  She was walking on a more consistent basis with a friend.  She is currently doing no regular exercise.  She works part-time as a Engineer, water.  Cynthia Berger's habits were reviewed today and are as follows: Her family eats meals together, she started gaining weight 5 years ago, of 50 pounds, she snacks frequently in the evenings, she frequently makes poor food choices, and she struggles with  emotional eating.  Other Fatigue Cynthia Berger admits to daytime somnolence and admits to waking up still tired. Patient has a history of symptoms of morning fatigue. Cynthia Berger generally gets 6 hours of sleep per night, and states that she has generally restful sleep. Snoring is present. Apneic episodes are not present. Epworth Sleepiness Score is 12.   Shortness of Breath Cynthia Berger notes increasing shortness of breath with exercising and seems to be worsening over time with weight gain. She notes getting out of breath sooner with activity than she used to. This has gotten worse recently. Cynthia Berger denies shortness of breath at rest or orthopnea.   Depression Screen Cynthia Berger Food and Mood (modified PHQ-9) score was 2.     05/08/2024    2:04 PM  Depression screen PHQ 2/9  Decreased Interest 0  Down, Depressed, Hopeless 0  PHQ - 2 Score 0     Assessment and Plan:   Other Fatigue Cynthia Berger does feel that her weight is causing her energy to be lower than it should be. Fatigue may be related to obesity, depression or many other causes. Labs will be ordered, and in the meanwhile, Cynthia Berger will focus on self care including making healthy food choices, increasing physical activity and focusing on stress reduction.  Shortness of Breath Cynthia Berger does feel that she gets out of breath more easily that she used to when she exercises. Cynthia Berger's shortness of breath appears to be obesity related and exercise induced. She has agreed to work on weight loss  and gradually increase exercise to treat her exercise induced shortness of breath. Will continue to monitor closely.    Problem List Items Addressed This Visit     Benign essential hypertension Currently on amlodipine  5 mg once daily and metoprolol  25 mg XL once daily for hypertension.  Systolic blood pressure mildly elevated at 137 today.  Continue current use of antihypertensive medication with close PCP follow-up.   CKD (chronic kidney disease), stage III  (HCC) Last GFR slightly low at 49 dated 04/29/2024.  Renal function has been stable status post nephrectomy for renal cell carcinoma.  Avoid use of nephrotoxic medication and work on hydrating well with water .   HFrEF (heart failure with reduced ejection fraction) (HCC) Previously seen by Dr. Pietro, her last 2D echocardiogram was found from 04/10/2012 with a mildly reduced ejection fraction of 45 to 50%.  She denies any recent issues with leg edema or dyspnea on exertion.  Begin active plan for weight reduction and consider follow-up with cardiology if having any exercise capacity issues.   Other Visit Diagnoses       SOBOE (shortness of breath on exertion)    -  Primary     Other fatigue       Relevant Orders   EKG 12-Lead (Completed)   TSH   T4, free   T3   Hemoglobin A1c   Folate   Vitamin B12   CBC with Differential/Platelet     Weight gain       Relevant Orders   Insulin, random   Hemoglobin A1c     Obesity, Class II, BMI 35-39.9         BMI 36.0-36.9,adult           Cynthia Berger is currently in the action stage of change and her goal is to get back to weightloss efforts . I recommend Cynthia Berger begin the structured treatment plan as follows:  She has agreed to Category 2 Plan  Exercise goals: Older adults should follow the adult guidelines. When older adults cannot meet the adult guidelines, they should be as physically active as their abilities and conditions will allow.  Behavioral modification strategies:increasing lean protein intake, increase H2O intake, decrease liquid calories, increase high fiber foods, decreasing eating out, no skipping meals, meal planning and cooking strategies, keeping healthy foods in the home, better snacking choices, avoiding temptations, and planning for success  She was informed of the importance of frequent follow-up visits to maximize her success with intensive lifestyle modifications for her multiple health conditions. She was informed we would  discuss her lab results at her next visit unless there is a critical issue that needs to be addressed sooner. Cynthia Berger agreed to keep her next visit at the agreed upon time to discuss these results.  Objective:  General: Cooperative, alert, well developed, in no acute distress. HEENT: Conjunctivae and lids unremarkable. Cardiovascular: Regular rhythm.  Lungs: Normal work of breathing. Neurologic: No focal deficits.   Lab Results  Component Value Date   CREATININE 1.15 04/29/2024   BUN 18 04/29/2024   NA 139 04/29/2024   K 4.4 04/29/2024   CL 106 04/29/2024   CO2 25 04/29/2024   Lab Results  Component Value Date   ALT 16 04/29/2024   AST 19 04/29/2024   ALKPHOS 85 04/29/2024   BILITOT 0.3 04/29/2024   Lab Results  Component Value Date   HGBA1C 5.5 04/15/2022   HGBA1C 5.5 02/03/2021   HGBA1C 5.0 02/03/2020   No results found for: INSULIN  Lab Results  Component Value Date   TSH 1.80 12/22/2023   Lab Results  Component Value Date   CHOL 183 04/29/2024   HDL 52.00 04/29/2024   LDLCALC 115 (H) 04/29/2024   TRIG 78.0 04/29/2024   CHOLHDL 4 04/29/2024   Lab Results  Component Value Date   WBC 6.5 03/27/2023   HGB 12.9 03/27/2023   HCT 39.2 03/27/2023   MCV 88.8 03/27/2023   PLT 270.0 03/27/2023   No results found for: IRON, TIBC, FERRITIN  Attestation Statements:  Reviewed by clinician on day of visit: allergies, medications, problem list, medical history, surgical history, family history, social history, and previous encounter notes.  Time spent on visit including pre-visit chart review and post-visit charting and face- to face care including nutritional counseling, review of EKG, interpretation of body composition scale and indirect calorimetry and nutrition prescription  was 40 minutes.   Darice Haddock, D.O. DABFM, DABOM [facility] [address] [place], [place] [address] [phone]

## 2024-09-20 LAB — TSH: TSH: 1.21 u[IU]/mL (ref 0.450–4.500)

## 2024-09-20 LAB — FOLATE: Folate: 8.1 ng/mL (ref >3.0)

## 2024-09-20 LAB — CBC WITH DIFFERENTIAL/PLATELET
Basophils Absolute: 0.1 x10E3/uL (ref 0.0–0.2)
Basos: 1 %
EOS (ABSOLUTE): 0.2 x10E3/uL (ref 0.0–0.4)
Eos: 3 %
Hematocrit: 44.2 % (ref 34.0–46.6)
Hemoglobin: 14.3 g/dL (ref 11.1–15.9)
Immature Grans (Abs): 0 x10E3/uL (ref 0.0–0.1)
Immature Granulocytes: 0 %
Lymphocytes Absolute: 1.7 x10E3/uL (ref 0.7–3.1)
Lymphs: 29 %
MCH: 29.2 pg (ref 26.6–33.0)
MCHC: 32.4 g/dL (ref 31.5–35.7)
MCV: 90 fL (ref 79–97)
Monocytes Absolute: 0.5 x10E3/uL (ref 0.1–0.9)
Monocytes: 9 %
Neutrophils Absolute: 3.3 x10E3/uL (ref 1.4–7.0)
Neutrophils: 58 %
Platelets: 303 x10E3/uL (ref 150–450)
RBC: 4.89 x10E6/uL (ref 3.77–5.28)
RDW: 12.8 % (ref 11.7–15.4)
WBC: 5.7 x10E3/uL (ref 3.4–10.8)

## 2024-09-20 LAB — HEMOGLOBIN A1C
Est. average glucose Bld gHb Est-mCnc: 114 mg/dL
Hgb A1c MFr Bld: 5.6 % (ref 4.8–5.6)

## 2024-09-20 LAB — T3: T3, Total: 127 ng/dL (ref 71–180)

## 2024-09-20 LAB — VITAMIN B12: Vitamin B-12: 291 pg/mL (ref 232–1245)

## 2024-09-20 LAB — T4, FREE: Free T4: 1.21 ng/dL (ref 0.82–1.77)

## 2024-09-20 LAB — INSULIN, RANDOM: INSULIN: 13.5 u[IU]/mL (ref 2.6–24.9)

## 2024-09-23 ENCOUNTER — Ambulatory Visit: Payer: Self-pay | Admitting: Family Medicine

## 2024-10-03 ENCOUNTER — Ambulatory Visit: Admitting: Family Medicine

## 2024-10-03 ENCOUNTER — Encounter: Payer: Self-pay | Admitting: Family Medicine

## 2024-10-03 VITALS — BP 134/76 | HR 64 | Temp 98.0°F | Ht 62.5 in | Wt 197.0 lb

## 2024-10-03 DIAGNOSIS — N1831 Chronic kidney disease, stage 3a: Secondary | ICD-10-CM | POA: Diagnosis not present

## 2024-10-03 DIAGNOSIS — I129 Hypertensive chronic kidney disease with stage 1 through stage 4 chronic kidney disease, or unspecified chronic kidney disease: Secondary | ICD-10-CM

## 2024-10-03 DIAGNOSIS — E538 Deficiency of other specified B group vitamins: Secondary | ICD-10-CM | POA: Diagnosis not present

## 2024-10-03 DIAGNOSIS — Z6835 Body mass index (BMI) 35.0-35.9, adult: Secondary | ICD-10-CM

## 2024-10-03 DIAGNOSIS — I10 Essential (primary) hypertension: Secondary | ICD-10-CM

## 2024-10-03 DIAGNOSIS — E66812 Obesity, class 2: Secondary | ICD-10-CM

## 2024-10-03 MED ORDER — B-12 500 MCG PO TABS: 500.0000 ug | ORAL_TABLET | Freq: Every day | ORAL | 0 refills | Status: AC

## 2024-10-03 NOTE — Progress Notes (Signed)
 Office: [phone]  /  Fax: [phone]  WEIGHT SUMMARY AND BIOMETRICS  Starting Date: 09/19/24  Starting Weight: 202lb   Weight Lost Since Last Visit: 5lb   Vitals Temp: 98 F (36.7 C) BP: 134/76 Pulse Rate: 64 SpO2: 100 %   Body Composition  Body Fat %: 45.3 % Fat Mass (lbs): 89.6 lbs Muscle Mass (lbs): 102.8 lbs Total Body Water  (lbs): 74.2 lbs Visceral Fat Rating : 14    HPI  Chief Complaint: OBESITY  Cynthia Berger is here to discuss her progress with her obesity treatment plan. She is on the the Category 2 Plan and states she is following her eating plan approximately 80 % of the time. She states she is exercising 30-40 minutes 2 times per week.  Interval History:  Since last office visit she is down 5 lb  She is up 2 lb of muscle mass and is down 7 lb of body fat She is liking most of the food on her plan She did change from sugar to stevia in coffee Her daughters have been supportive She denies cravings She is walking 30 min 2 x a week and has an active job  Pharmacotherapy: none  PHYSICAL EXAM:  Blood pressure 134/76, pulse 64, temperature 98 F (36.7 C), height 5' 2.5 (1.588 m), weight 197 lb (89.4 kg), last menstrual period 08/23/2006, SpO2 100%. Body mass index is 35.46 kg/m.  General: She is overweight, cooperative, alert, well developed, and in no acute distress. PSYCH: Has normal mood, affect and thought process.   Lungs: Normal breathing effort, no conversational dyspnea.   ASSESSMENT AND PLAN  TREATMENT PLAN FOR OBESITY:  Recommended Dietary Goals  [name] is currently in the action stage of change. As such, her goal is to continue weight management plan. She has agreed to the Category 2 Plan. With changes reviewed on AVS  Behavioral Intervention  We discussed the following Behavioral Modification Strategies today: increasing lean protein intake to established goals, increasing fiber rich foods, increasing water  intake , work on  meal planning and preparation, keeping healthy foods at home, work on managing stress, creating time for self-care and relaxation, continue to practice mindfulness when eating, and planning for success.  Additional resources provided today: NA  Recommended Physical Activity Goals  Kaleb has been advised to work up to 150 minutes of moderate intensity aerobic activity a week and strengthening exercises 2-3 times per week for cardiovascular health, weight loss maintenance and preservation of muscle mass.   She has agreed to Increase the intensity, frequency or duration of aerobic exercises    Pharmacotherapy changes for the treatment of obesity: none  ASSOCIATED CONDITIONS ADDRESSED TODAY  Low serum vitamin B12 Lab Results  Component Value Date   VITAMINB12 291 09/19/2024  Reviewed lab results with patient B12 borderline low, with a low intake of egg yolks and red meat Denies paresthesias or brain fog Begin: -     B-12; Take 500 mcg by mouth daily.  Dispense: 90 tablet; Refill: 0  Obesity, Class II, BMI 35-39.9 Improving Reviewed bioimpedence results with patient  BMI 35.0-35.9,adult  Benign essential hypertension BP well controlled on amlodopine 5 mg daily, metoprolol  XL 25 mg daily Hx of HFrEF- plan to update echocardiogram Continue current meds and active plan for weight reduction Plan to ramp up walking time  Stage 3a chronic kidney disease [facility])   Chemistry      Component Value Date/Time   NA 139 04/29/2024 1026   K 4.4 04/29/2024 1026  CL 106 04/29/2024 1026   CO2 25 04/29/2024 1026   BUN 18 04/29/2024 1026   CREATININE 1.15 04/29/2024 1026      Component Value Date/Time   CALCIUM 9.3 04/29/2024 1026   CALCIUM 9.1 10/11/2011 1539   ALKPHOS 85 04/29/2024 1026   AST 19 04/29/2024 1026   ALT 16 04/29/2024 1026   BILITOT 0.3 04/29/2024 1026     She is using topical Diclofenac  for arthritis pain Renal function has been stable Avoid use of nephrotoxic meds     She was informed of the importance of frequent follow up visits to maximize her success with intensive lifestyle modifications for her multiple health conditions.   ATTESTASTION STATEMENTS:  Reviewed by clinician on day of visit: allergies, medications, problem list, medical history, surgical history, family history, social history, and previous encounter notes pertinent to obesity diagnosis.   I have personally spent 30 minutes total time today in preparation, patient care, nutritional counseling and education,  and documentation for this visit, including the following: review of most recent clinical lab tests, prescribing medications/ refilling medications, reviewing medical assistant documentation, review and interpretation of bioimpedence results.     Darice Haddock, D.O. DABFM, DABOM [facility] [address] [place], [place] [address] [phone]

## 2024-10-03 NOTE — Patient Instructions (Signed)
 Keep up the good work!  Begin vitamin B12 500 mcg daily  You can expand your fruit to 2 servings per day (any fresh or frozen fruit)  You have the option to had 1/4 plate CARB to dinner: Rice  Potato - white or sweet Pasta - Barilla protein pasta Carb balance tortilla Beans, corn, carrots OK  Stay away from pizza, chips, fries Limit bread with dinner

## 2024-10-04 DIAGNOSIS — H52223 Regular astigmatism, bilateral: Secondary | ICD-10-CM | POA: Diagnosis not present

## 2024-10-04 DIAGNOSIS — H524 Presbyopia: Secondary | ICD-10-CM | POA: Diagnosis not present

## 2024-10-30 ENCOUNTER — Ambulatory Visit (INDEPENDENT_AMBULATORY_CARE_PROVIDER_SITE_OTHER): Admitting: Family Medicine

## 2024-10-30 VITALS — BP 138/84 | HR 54 | Temp 97.9°F | Ht 62.5 in | Wt 195.0 lb

## 2024-10-30 DIAGNOSIS — R948 Abnormal results of function studies of other organs and systems: Secondary | ICD-10-CM | POA: Diagnosis not present

## 2024-10-30 DIAGNOSIS — Z6835 Body mass index (BMI) 35.0-35.9, adult: Secondary | ICD-10-CM | POA: Diagnosis not present

## 2024-10-30 DIAGNOSIS — E66812 Obesity, class 2: Secondary | ICD-10-CM | POA: Diagnosis not present

## 2024-10-30 DIAGNOSIS — E538 Deficiency of other specified B group vitamins: Secondary | ICD-10-CM | POA: Diagnosis not present

## 2024-10-30 NOTE — Patient Instructions (Addendum)
 You can replace breakfast on plan with a premixed protein shake: Premier Protein  Quest Pure Protein FairLife shakes or Core Power (lactose free) Equate -- 30 g of protein/ 1 g of sugar  Keep lunch and dinner on plan  Remember lean protein intake with meals Work on: ramping up walking time, adding in gym workouts/ classes 2 days/ wk Aim for 8 hrs of sleep at night  Pick up vitamin B12 500 mcg daily

## 2024-10-30 NOTE — Progress Notes (Signed)
 Office: [phone]  /  Fax: [phone]  WEIGHT SUMMARY AND BIOMETRICS  Starting Date: 09/19/24  Starting Weight: 202lb   Weight Lost Since Last Visit: 2lb   Vitals Temp: 97.9 F (36.6 C) BP: 138/84 Pulse Rate: (!) 54 SpO2: 99 %   Body Composition  Body Fat %: 44.6 % Fat Mass (lbs): 87 lbs Muscle Mass (lbs): 102.6 lbs Total Body Water  (lbs): 73.8 lbs Visceral Fat Rating : 14    HPI  Chief Complaint: OBESITY  Cynthia Berger is here to discuss her progress with her obesity treatment plan. She is on the the Category 2 Plan and states she is following her eating plan approximately 50 % of the time. She states she is exercising 30 minutes 2 times per week.  Interval History:  Since last office visit she is down 2 lb This gives her a net weight loss of 7 lb in the past month of medically supervised weight management This is a 3.4% total body weight loss She is keeping healthy food at home Denies hunger or cravings She is drinking more water  She is walking more, up to 2 x a week She is going on an 8 day cruise after Thanksgiving  Pharmacotherapy: None  PHYSICAL EXAM:  Blood pressure 138/84, pulse (!) 54, temperature 97.9 F (36.6 C), height 5' 2.5 (1.588 m), weight 195 lb (88.5 kg), last menstrual period 08/23/2006, SpO2 99%. Body mass index is 35.1 kg/m.  General: She is overweight, cooperative, alert, well developed, and in no acute distress. PSYCH: Has normal mood, affect and thought process.   Lungs: Normal breathing effort, no conversational dyspnea.   ASSESSMENT AND PLAN  TREATMENT PLAN FOR OBESITY:  Recommended Dietary Goals  Cynthia Berger is currently in the action stage of change. As such, her goal is to continue weight management plan. She has agreed to the Category 2 Plan.  Behavioral Intervention  We discussed the following Behavioral Modification Strategies today: increasing lean protein intake to established goals, increasing fiber rich foods,  increasing water  intake , work on meal planning and preparation, keeping healthy foods at home, planning for success, and staying on track while traveling and vacationing. Reviewed dietary change goals on after visit summary She may replace breakfast on plan with a premixed protein shake with approximately 150 cal, 20 to 30 g of protein and less than 8 g of sugar  Additional resources provided today: NA  Recommended Physical Activity Goals  Cynthia Berger has been advised to work up to 150 minutes of moderate intensity aerobic activity a week and strengthening exercises 2-3 times per week for cardiovascular health, weight loss maintenance and preservation of muscle mass.   She has agreed to Start aerobic activity with a goal of 150 minutes a week at moderate intensity.  Reviewed exercise change goals and after visit summary  Pharmacotherapy changes for the treatment of obesity: None  ASSOCIATED CONDITIONS ADDRESSED TODAY  Low basal metabolic rate Initial metabolic rate low at 1195 cal/day She is doing well and taking about 1200 cal/day, still creating a calorie deficit and losing weight. Encouraged adequate lean protein intake with meals getting in approximately 90 g of protein daily, improving sleep time to 8 hours at night, hydrating well with water  and staying physically active.  Low serum vitamin B12 Lab Results  Component Value Date   VITAMINB12 291 09/19/2024   She has some associated fatigue from low B12 levels.  She has not yet started vitamin B12 500 mcg daily.  It is requested that she  pick this up over-the-counter and begin the B12 supplement at 500 mcg daily.  Repeat lab in 4 months  Obesity, Class II, BMI 35-39.9 Improving.  Reviewed her results on bioimpedance.  She is staying mindful of food choices, meal planning and portion control.  She has a plan leading up to her upcoming vacation and remains motivated to continue working on medically supervised weight loss without  addition of antiobesity medication given her chronic medical conditions.  She has room to ramp up regular exercise, plan reviewed.  BMI 35.0-35.9,adult      She was informed of the importance of frequent follow up visits to maximize her success with intensive lifestyle modifications for her multiple health conditions.   ATTESTASTION STATEMENTS:  Reviewed by clinician on day of visit: allergies, medications, problem list, medical history, surgical history, family history, social history, and previous encounter notes pertinent to obesity diagnosis.   I have personally spent 30 minutes total time today in preparation, patient care, nutritional counseling and education,  and documentation for this visit, including the following: review of most recent clinical lab tests, prescribing medications/ refilling medications, reviewing medical assistant documentation, review and interpretation of bioimpedence results.     Cynthia Berger, D.O. DABFM, DABOM [facility] [address] [place], [place] [address] [phone]

## 2024-12-02 ENCOUNTER — Ambulatory Visit: Admitting: Family Medicine

## 2024-12-02 ENCOUNTER — Encounter: Payer: Self-pay | Admitting: Family Medicine

## 2024-12-02 VITALS — BP 141/87 | HR 63 | Temp 98.0°F | Ht 62.5 in | Wt 195.0 lb

## 2024-12-02 DIAGNOSIS — I10 Essential (primary) hypertension: Secondary | ICD-10-CM | POA: Diagnosis not present

## 2024-12-02 DIAGNOSIS — R948 Abnormal results of function studies of other organs and systems: Secondary | ICD-10-CM | POA: Diagnosis not present

## 2024-12-02 DIAGNOSIS — Z6835 Body mass index (BMI) 35.0-35.9, adult: Secondary | ICD-10-CM | POA: Diagnosis not present

## 2024-12-02 DIAGNOSIS — E66812 Obesity, class 2: Secondary | ICD-10-CM | POA: Diagnosis not present

## 2024-12-02 NOTE — Progress Notes (Signed)
 Office: [phone]  /  Fax: [phone]  WEIGHT SUMMARY AND BIOMETRICS  Starting Date: 09/19/24  Starting Weight: 202lb   Weight Lost Since Last Visit: 0lb   Vitals Temp: 98 F (36.7 C) BP: (!) 141/87 Pulse Rate: 63 SpO2: 99 %   Body Composition  Body Fat %: 45.1 % Fat Mass (lbs): 88.2 lbs Muscle Mass (lbs): 101.8 lbs Total Body Water  (lbs): 73.4 lbs Visceral Fat Rating : 14    HPI  Chief Complaint: OBESITY  Cynthia Berger is here to discuss her progress with her obesity treatment plan. She is on the the Category 2 Plan and states she is following her eating plan approximately 50 % of the time. She states she is exercising 30 minutes 3 times per week.  Interval History:  Since last office visit she is down 0 lb She is down 0.8 lb of muscle mass and is up 1.2 lb of body fat This gives her a net weight loss of 7 lb in 2 mos of medically supervised weight management She did add in a protein shake for breakfast daily instead of skipping the meal She has had more sweets lately She is getting in her fruits and veggies daily She is doing more walking, limited some by knee pain She has a walking buddy but the cold weather has limited her She went on a cruise since her last visit  Pharmacotherapy: none  PHYSICAL EXAM:  Blood pressure (!) 141/87, pulse 63, temperature 98 F (36.7 C), height 5' 2.5 (1.588 m), weight 195 lb (88.5 kg), last menstrual period 08/23/2006, SpO2 99%. Body mass index is 35.1 kg/m.  General: She is overweight, cooperative, alert, well developed, and in no acute distress. PSYCH: Has normal mood, affect and thought process.   Lungs: Normal breathing effort, no conversational dyspnea.  ASSESSMENT AND PLAN  TREATMENT PLAN FOR OBESITY:  Recommended Dietary Goals  Marta is currently in the action stage of change. As such, her goal is to continue weight management plan. She has agreed to the Category 2 Plan and keeping a food journal and  adhering to recommended goals of 1200-1300 calories and 80+ g of  protein.  Behavioral Intervention  We discussed the following Behavioral Modification Strategies today: increasing lean protein intake to established goals, increasing fiber rich foods, increasing water  intake , work on tracking and journaling calories using tracking application, reading food labels , keeping healthy foods at home, avoiding temptations and identifying enticing environmental cues, continue to practice mindfulness when eating, and planning for success.  Additional resources provided today: NA  Recommended Physical Activity Goals  [name] has been advised to work up to 150 minutes of moderate intensity aerobic activity a week and strengthening exercises 2-3 times per week for cardiovascular health, weight loss maintenance and preservation of muscle mass.   She has agreed to Increase the intensity, frequency or duration of aerobic exercises   Increase walking time/ consistency to 30 min 5 days/ wk -- she can pick treadmill/ gym/ outdoors/ at work, etc  Pharmacotherapy changes for the treatment of obesity: none  ASSOCIATED CONDITIONS ADDRESSED TODAY  Low basal metabolic rate Initial REE low at 1195 kcal per day due to chronic dieting, lack of adequate physical activity Work on either cat 2 meal plan OR tracking consistently Do not skip meals, prioritize protein intake with all 3 meals Ramp up walking time/ consistency to 30 min 5 days/ wk Aim for 8 hrs of sleep at night  Repeat fasting IC Spring 2026  Obesity, Class II, BMI 35-39.9  BMI 35.0-35.9,adult  Benign essential hypertension BP is slightly elevated today on amlodopine 5 mg daily And Toprol  XL 25 mg daily F/u with Dr Jordan Avoid high sodium foods/ meals out     She was informed of the importance of frequent follow up visits to maximize her success with intensive lifestyle modifications for her multiple health conditions.   ATTESTASTION  STATEMENTS:  Reviewed by clinician on day of visit: allergies, medications, problem list, medical history, surgical history, family history, social history, and previous encounter notes pertinent to obesity diagnosis.   I have personally spent 23 minutes total time today in preparation, patient care, nutritional counseling and education,  and documentation for this visit, including the following: review of most recent clinical lab tests,  reviewing medical assistant documentation, review and interpretation of bioimpedence results.     Darice Haddock, D.O. DABFM, DABOM [facility] [address] [place], [place] [address] [phone]

## 2024-12-02 NOTE — Patient Instructions (Addendum)
 Try to track calorie intake using the [employer] app  Aim for 1200-1300 calories per day Don't add your exercise into the app Aim for 80-90 g of protein daily  Aim for 30+ min of walking 5 days/ wk (indoors or outdoors)

## 2024-12-23 ENCOUNTER — Encounter: Payer: Self-pay | Admitting: Internal Medicine

## 2024-12-23 ENCOUNTER — Other Ambulatory Visit

## 2024-12-23 ENCOUNTER — Ambulatory Visit: Payer: Medicare HMO | Admitting: Internal Medicine

## 2024-12-23 VITALS — BP 120/70 | HR 63 | Ht 62.5 in | Wt 198.2 lb

## 2024-12-23 DIAGNOSIS — E0590 Thyrotoxicosis, unspecified without thyrotoxic crisis or storm: Secondary | ICD-10-CM | POA: Diagnosis not present

## 2024-12-23 DIAGNOSIS — E042 Nontoxic multinodular goiter: Secondary | ICD-10-CM | POA: Diagnosis not present

## 2024-12-23 NOTE — Patient Instructions (Signed)
 For now, continue off Methimazole .  Please stop at the lab.  You should have an endocrinology follow-up appointment in 1 year.

## 2024-12-23 NOTE — Progress Notes (Addendum)
 Patient ID: Cynthia Berger, female   DOB: 1956-06-21, 69 y.o.   MRN: [id number]  HPI  Cynthia Berger is a 69 y.o.-year-old female, returning for follow-up for thyrotoxicosis and multinodular goiter.  She previously saw Dr. Kassie, but last visit with me 1 year ago.  Interim history: At last visit, she gained 16 pounds back after losing 10 pounds by reducing portions.  She lost 3 pounds since then. She is planning to start proving diet now in the new year-sees a nutritionist.  Thyrotoxicosis - Previously on low-dose methimazole , 2.5 mg daily, which we stopped in 12/2022.  I reviewed pt's thyroid  tests: Lab Results  Component Value Date   TSH 1.210 09/19/2024   TSH 1.80 12/22/2023   TSH 1.11 01/25/2023   TSH 2.44 12/21/2022   TSH 2.05 01/12/2022   TSH 0.15 (L) 01/11/2021   TSH 0.99 01/10/2020   TSH 0.91 01/09/2019   TSH 0.63 10/23/2017   TSH 0.89 02/08/2016   FREET4 1.21 09/19/2024   FREET4 1.2 12/22/2023   FREET4 0.90 01/25/2023   FREET4 0.92 12/21/2022   FREET4 0.74 01/12/2022   FREET4 0.87 01/11/2021   FREET4 0.88 01/09/2019   FREET4 0.82 06/09/2014   FREET4 0.94 05/10/2013   FREET4 0.90 03/07/2012   T3FREE 2.8 12/22/2023   T3FREE 3.1 01/25/2023   T3FREE 3.2 12/21/2022   Antithyroid antibodies: Lab Results  Component Value Date   TSI <89 12/21/2022   Reviewed previous imaging and biopsy results: Thyroid  U/S (03/09/2012): Right thyroid  lobe:  5.7 x 2.1 x 3.1 cm.  Left thyroid  lobe:  5.1 x 1.8 x 1.9 cm.  Isthmus:  0.5 cm.   Focal nodules:   There is a 2.9 x 1.2 x 2.5 cm inhomogeneous primarily solid nodule in the midportion of the right lobe.   There is a 1.1 x 0.6 ounces 0.6 cm nodule in the right upper pole.   There is a 0.9 x 0.7 x 1.0 cm solid nodule in the right lower pole.   On the left there is a 0.9 x 0.4 x 0.8 cm solid nodule in the upper pole.   There is a 0.8 x 0.6 x 0.7 cm solid nodule in the left lower pole.   Lymphadenopathy: None visualized.    IMPRESSION:  Multi nodular goiter.  Dominant 2.9 x 1.2 x 2.5 cm nodule in the  midportion of the right lobe fits national criteria for fine needle  aspiration biopsy.   FNA of the R thyroid  nodule (03/20/2012): Adequacy Reason Satisfactory For Evaluation. Diagnosis THYROID , FINE NEEDLE ASPIRATION, RIGHT: BENIGN THYROID  NODULE. Specimen Clinical Information 2.9 x 1.2 x 2.5cm dominant solid nodule mid right lobe thyroid  Source Thyroid , Fine Needle Aspiration, Right  ... Serial U/S : 2014, 2017, 2020...  Thyroid  U/S (02/01/2020): Parenchymal Echotexture: Mildly heterogenous Isthmus: Normal in size measures 0.5 cm in diameter  Right lobe: Borderline enlarged measuring 5.6 x 2.4 x 2.6 cm, previously, 5.8 x 2.9 x 2.4 cm Left lobe: Normal in size measuring 5.4 x 1.8 x 1.8 cm, previously, 5.2 x 1.8 x 2.1 cm  _________________________________________________________   Estimated total number of nodules >/= 1 cm: 5 _________________________________________________________   The approximately 1.0 x 0.7 x 0.5 cm hypoechoic nodule within the superior pole the right lobe of the thyroid  (labeled 1) is unchanged compared to the 09/2013 examination. Stability for greater than 5 years is indicative benign etiology.   The previously biopsied approximately 3.2 x 2.1 x 1.1 cm hypoechoic nodule within mid aspect the right  lobe of the thyroid  (labeled 2), is unchanged compared to the 09/2013 examination, previously, 3.1 x 2.2 x 1.3 cm. Stability for greater than 5 years is indicative of benign etiology.     The approximately 2.0 x 1.6 x 1.3 cm partially cystic though predominantly solid nodule within the inferior pole the right lobe of the thyroid  (labeled 3), has increased in size compared to the 09/2013 examination, previously, 1.2 x 0.9 x 0.8 cm though size differences are attributable to interval partial cystic degeneration, a typically benign  finding.   _________________________________________________________   Nodule # 4:  Prior biopsy: No  Location: Left; Superior  Maximum size: 1.8 x 1.6 x 0.8 cm, previously, 1.6 x 1.4 x 0.8 cm when compared to the 01/2019 examination, previously, 1.2 x 1.0 x 0.6 cm when compared to the 01/2016 examination. Composition: solid/almost completely solid (2)  Echogenicity: isoechoic (1) Significant change in size (>/= 20% in two dimensions and minimal increase of 2 mm): Yes *Given size (>/= 1.5 - 2.4 cm) and appearance, a follow-up ultrasound in 1 year should be considered based on TI-RADS criteria.   _________________________________________________________   The approximately 0.9 cm ill-defined nodule/pseudonodule within mid aspect of the left lobe of the thyroid  (labeled 5), is unchanged compared to the 09/2013 examination previously, 0.8 cm, and again does not meet imaging criteria to recommend percutaneous sampling or continued dedicated follow-up.     IMPRESSION: 1. Similar findings of multinodular goiter. 2. Continued increase in size of now 1.9 cm ill-defined isoechoic nodule within the superior pole of the left lobe of the thyroid  (labeled #4), which again meets imaging criteria to recommend a 1 year follow-up. 3. Unchanged appearance of previously biopsied dominant nodule within the right lobe of the thyroid  (labeled #3). Assuming a benign pathologic diagnosis, repeat sampling and/or continued dedicated follow-up is not recommended.  Thyroid  U/S (01/09/2023): Parenchymal Echotexture: Mildly heterogeneous  Isthmus: 0.6 cm  Right lobe: 5.4 x 2.3 x 2.9 cm  Left lobe: 5.1 x 1.9 x 2.0 cm  _________________________________________________________   Estimated total number of nodules >/= 1 cm: 5 _________________________________________________________   Nodule 1: 1.2 x 0.9 x 0.6 cm solid hypoechoic right superior thyroid  nodule is not significantly changed in size since  02/11/2016 where it measured 0.9 x 0.5 x 0.7 cm, which is consistent with a benign etiology.  _________________________________________________________   Nodule 2: 0.6 cm cystic nodule in the superior right thyroid  lobe does not meet criteria for imaging surveillance or FNA.  _________________________________________________________   Nodule 3: 3.1 x 2.5 x 0.8 cm solid isoechoic right mid thyroid  nodule is unchanged in size since 02/11/2016 which indicates a benign etiology. Please correlate with prior FNA results from 03/20/2012.  _________________________________________________________   Nodule # 4:  Prior biopsy: No  Location: Right; inferior  Maximum size: 2.7 cm; Other 2 dimensions: 2.3 x 2.1 cm, previously, 2.0 x 1.9 x 1.3 cm  Composition: solid/almost completely solid (2)  Echogenicity: isoechoic (1)  Significant change in size (>/= 20% in two dimensions and minimal increase of 2 mm): Yes  Change in features: No  Change in ACR TI-RADS risk category: No  **Given size (>/= 2.5 cm) and appearance, fine needle aspiration of this mildly suspicious nodule should be considered based on TI-RADS criteria.  _________________________________________________________   Nodule # 5:  Prior biopsy: No  Location: Left; superior  Maximum size: 2.1 cm; Other 2 dimensions: 2.1 x 1.0 cm, previously, 1.9 x 1.6 x 0.8 cm on 01/31/2020 and 1.2 x 0.6  x 1.1 cm on 02/11/2016.  Composition: solid/almost completely solid (2)  Echogenicity: isoechoic (1) *Given size (>/= 1.5 - 2.4 cm) and appearance, a follow-up ultrasound in 1 year should be considered based on TI-RADS criteria. _________________________________________________________   Remaining subcentimeter left thyroid  nodules do not meet criteria for FNA or imaging follow-up.   IMPRESSION: 1. Nodule 4 (TI-RADS 3), located in the inferior right thyroid  lobe, measuring 2.7 x 2.3 x 2.1 cm, demonstrates interval growth and now meets  criteria for FNA. 2. Nodule 5 (TI-RADS 3), located in the superior left thyroid  lobe, measuring 2.1 x 2.1 x 1.0 cm is not significantly changed in size since prior examination from 02/08/2020 and still meets criteria for imaging follow-up. Annual ultrasound surveillance is recommended until 5 years of stability is documented.  The right inferior isoechoic thyroid  nodule appears to be larger than before, now >2.5 cm.  Will suggest biopsy. The left superior thyroid  nodule is also slightly larger but this is isoechoic and smaller than 2.5 cm so he does not qualify for biopsy yet.  FNA (02/01/2023): Clinical History: Right inferior 2.7cm; Other 2 dimensions: 2.3 x 2.1cm, previously, 2.0 x 1.9 x 1.3cm, Solid / almost completely solid, Isoechoic, TI-RADS total points 3 Specimen Submitted:  A. THYROID , RIGHT INFERIOR, FINE NEEDLE ASPIRATION:   FINAL MICROSCOPIC DIAGNOSIS: - Consistent with benign follicular nodule (Bethesda category II)  SPECIMEN ADEQUACY: Satisfactory for evaluation  Thyroid  U/S (01/15/2024): Parenchymal Echotexture: Moderately heterogeneous  Isthmus: 0.3 cm ,previously 0.6 cm  Right lobe: 4.7 x 2.7 x 3.1 cm ,previously 5.4 x 2.3 x 2.9 cm  Left lobe: 4.7 x 1.7 x 2.0 cm ,previously 5.1 x 1.9 x 2.0 cm  ________________________________________________________   Estimated total number of nodules >/= 1 cm: 4 _________________________________________________________   Unchanged benign-appearing right superior solid thyroid  nodule (labeled 1, 0.2 cm, previously 1.2 cm).   Similar benign appearing right superior thyroid  cyst (labeled 2, 0.7 cm previously 0.6 cm).   Similar appearance of previously biopsied right mid solid thyroid  nodule (labeled 3, 3.1 cm, previously 3.1 cm).   Similar appearance of previously biopsied right inferior solid thyroid  nodule (labeled 4, 2.9 cm, previously 2.7 cm).   Nodule # 5:  Prior biopsy: No  Location: Left; Superior Maximum size:  2.5 cm; Other 2 dimensions: 1.9 x 1.2 cm, previously, 2.1 x 2.1 x 1.0 cm  Composition: mixed cystic and solid (1)  Echogenicity: isoechoic (1) Change in features: Yes Change in ACR TI-RADS risk category: Yes ACR TI-RADS recommendations: This nodule does NOT meet TI-RADS criteria for biopsy or dedicated follow-up.  _________________________________________________________   There is a benign-appearing spongiform nodule in the left inferior thyroid  (labeled 6, 0.8 cm, TI-RADS category 2) not requiring additional follow-up.   No cervical lymphadenopathy.   IMPRESSION: 1. Multinodular thyroid . 2. The previously visualized left superior solid thyroid  nodule (labeled 5, 2.5 cm, previously 2.1 cm) demonstrates interval partial cystic formation and is therefore downgraded to TI-RADS category 2, not requiring additional follow-up. 3. Similar appearance of previously biopsied right mid (labeled 3, 3.1 cm, previously 3.1 cm) and right inferior (labeled 4, 2.9 cm, previously 2.7 cm) thyroid  nodules. Recommend correlation with prior biopsy results.  Pt denies: - feeling nodules in neck - hoarseness - dysphagia (except occasionally, with pills) - choking  She denies: - excessive sweating/heat intolerance - tremors - anxiety - palpitations - hyperdefecation  Pt does have a FH of thyroid  ds. In her mother (Thyroid  cancer  - tx with RAI tx). No h/o radiation  tx to head or neck. No recent contrast studies. No steroid use. No herbal supplements. No Biotin use. She is on MVI.  Pt. also has a history of HTN, kidney cancer, esophageal dysmotility, GERD, hiatal hernia, DDD, vitamin D  deficiency.  ROS: + see HPI  Past Medical History:  Diagnosis Date   AC (acromioclavicular) joint bone spurs    HEEL BONE SPURS-SURGERY 12/2010 LEFT FOOT   Antral gastritis    Arthritis    Cancer of kidney (HCC)    Closed fracture of fifth toe of left foot with malunion    right   DDD (degenerative disc  disease), cervical    Difficult airway for intubation, initial encounter 05/28/2013   DL once by CRNA, MAC 3, no view of cords. DL twice by anesthesiologist MAC 3 no view of cords. ); Video Laryngoscope (Good view with glidescope. Cords were anterior)   Esophageal dysmotility    GERD (gastroesophageal reflux disease)    Heart palpitations    Hiatal hernia    Hypertension    Hyperthyroidism    LGSIL (low grade squamous intraepithelial dysplasia) 2013    C&B Inadequate ECC--positive LGSIL --   Multinodular goiter    RIGHT LOBE-- PER BX NEGATIVE   Positive H. pylori test    Thyroid  nodule    Vitamin D  deficiency 11/2007   LOW AT 18   Past Surgical History:  Procedure Laterality Date   ABDOMINAL HYSTERECTOMY N/A 05/28/2013   Procedure: HYSTERECTOMY ABDOMINAL;  Surgeon: Evalene SHAUNNA Organ, MD;  Location: [facility];  Service: Gynecology;  Laterality: N/A;   BIOPSY THYROID  Right 2014   ENLARGED THYROID  NO MEDS BENIGN   CERVICAL BIOPSY  W/ LOOP ELECTRODE EXCISION  10/2012   for persistent low-grade dysplasia   CESAREAN SECTION  1993   W/ TUBAL LIGATION, BILATERAL   CYSTOSCOPY N/A 05/28/2013   Procedure: CYSTOSCOPY;  Surgeon: Evalene SHAUNNA Organ, MD;  Location: [facility];  Service: Gynecology;  Laterality: N/A;   ESOPHAGOGASTRODUODENOSCOPY (EGD) WITH PROPOFOL  N/A 02/13/2023   Procedure: ESOPHAGOGASTRODUODENOSCOPY (EGD) WITH PROPOFOL ;  Surgeon: Leigh Elspeth SHAUNNA, MD;  Location: [facility];  Service: Gastroenterology;  Laterality: N/A;   esophogeal dilatation     FOOT SURGERY  2012   left heel spur removed   HYSTEROSCOPY WITH D & C  06/08/2012   Procedure: DILATATION AND CURETTAGE /HYSTEROSCOPY;  Surgeon: Evalene SHAUNNA Organ, MD;  Location: [facility];  Service: Gynecology;  Laterality: N/A;  WITH RESECTION OF MYOMA   NEPHRECTOMY  2005   RIGHT KIDNEY CANCER   ORIF TOE FRACTURE Right 08/15/2018   Procedure: RIGHT FIFTH TOE OSTEOTOMY AND PINNING;  Surgeon: Barbarann Oneil BROCKS, MD;   Location: [facility];  Service: Orthopedics;  Laterality: Right;   ROTATOR CUFF REPAIR  2010   RIGHT   s/p Right nephrectomy Right    SALPINGOOPHORECTOMY Bilateral 05/28/2013   Procedure: BILATERAL SALPINGO OOPHORECTOMY;  Surgeon: Evalene SHAUNNA Organ, MD;  Location: [facility];  Service: Gynecology;  Laterality: Bilateral;   TOE SURGERY  2020   baby toe on right foot   TRANSTHORACIC ECHOCARDIOGRAM  04-10-2012  DR BRIEN CRENSHAW   LVSF MILDLY REDUCED/ EF 45-50%   TUBAL LIGATION     Social History   Socioeconomic History   Marital status: Married    Spouse name: Not on file   Number of children: 4   Years of education: Not on file   Highest education level: 12th grade  Occupational History   Occupation: [employer]  Employer: [employer]  Tobacco Use   Smoking status: Never   Smokeless tobacco: Never  Vaping Use   Vaping status: Never Used  Substance and Sexual Activity   Alcohol use: Yes    Comment: occasional wine   Drug use: No   Sexual activity: Yes    Partners: Male    Birth control/protection: Post-menopausal, Surgical    Comment: Hyst, First IC >16y/o, <5 Partners  Other Topics Concern   Not on file  Social History Narrative   Not on file   Social Drivers of Health   Tobacco Use: Low Risk (12/02/2024)   Patient History    Smoking Tobacco Use: Never    Smokeless Tobacco Use: Never    Passive Exposure: Not on file  Financial Resource Strain: Low Risk (05/08/2024)   Overall Financial Resource Strain (CARDIA)    Difficulty of Paying Living Expenses: Not hard at all  Food Insecurity: No Food Insecurity (05/08/2024)   Hunger Vital Sign    Worried About Running Out of Food in the Last Year: Never true    Ran Out of Food in the Last Year: Never true  Transportation Needs: No Transportation Needs (05/08/2024)   PRAPARE - Administrator, Civil Service (Medical): No    Lack of Transportation (Non-Medical): No  Physical Activity:  Insufficiently Active (05/08/2024)   Exercise Vital Sign    Days of Exercise per Week: 2 days    Minutes of Exercise per Session: 30 min  Stress: No Stress Concern Present (05/08/2024)   [employer] of Occupational Health - Occupational Stress Questionnaire    Feeling of Stress : Not at all  Social Connections: Socially Integrated (05/08/2024)   Social Connection and Isolation Panel    Frequency of Communication with Friends and Family: More than three times a week    Frequency of Social Gatherings with Friends and Family: More than three times a week    Attends Religious Services: More than 4 times per year    Active Member of [employer] or Organizations: Yes    Attends Banker Meetings: More than 4 times per year    Marital Status: Married  Catering Manager Violence: Not At Risk (05/08/2024)   Humiliation, Afraid, Rape, and Kick questionnaire    Fear of Current or Ex-Partner: No    Emotionally Abused: No    Physically Abused: No    Sexually Abused: No  Depression (PHQ2-9): Low Risk (05/08/2024)   Depression (PHQ2-9)    PHQ-2 Score: 0  Alcohol Screen: Low Risk (05/08/2024)   Alcohol Screen    Last Alcohol Screening Score (AUDIT): 0  Housing: Unknown (05/08/2024)   Housing Stability Vital Sign    Unable to Pay for Housing in the Last Year: No    Number of Times Moved in the Last Year: Not on file    Homeless in the Last Year: No  Utilities: Not At Risk (05/08/2024)   AHC Utilities    Threatened with loss of utilities: No  Health Literacy: Adequate Health Literacy (05/08/2024)   B1300 Health Literacy    Frequency of need for help with medical instructions: Never   Current Outpatient Medications on File Prior to Visit  Medication Sig Dispense Refill   amLODipine  (NORVASC ) 5 MG tablet TAKE 1 TABLET BY MOUTH EVERY DAY 90 tablet 3   cholecalciferol (VITAMIN D ) 1000 UNITS tablet Take 1,000 Units by mouth daily.     Cyanocobalamin (B-12) 500 MCG TABS Take 500 mcg by  mouth  daily. 90 tablet 0   diclofenac  Sodium (VOLTAREN  ARTHRITIS PAIN) 1 % GEL Apply 2 g topically 4 (four) times daily. 150 g 1   famotidine  (PEPCID ) 20 MG tablet Take 1 tablet (20 mg total) by mouth 2 (two) times daily. 180 tablet 0   metoprolol  succinate (TOPROL -XL) 25 MG 24 hr tablet Take 1 tablet (25 mg total) by mouth daily. 90 tablet 3   Multiple Vitamins-Minerals (MULTIVITAMIN PO) Take 1 tablet by mouth daily.      No current facility-administered medications on file prior to visit.   Allergies  Allergen Reactions   Penicillins Rash   Family History  Problem Relation Age of Onset   Thyroid  disease Mother    Hyperlipidemia Mother    Hypertension Mother    Cancer Mother    Hypertension Sister    Hypertension Son    Colon cancer Neg Hx    Esophageal cancer Neg Hx    Rectal cancer Neg Hx    Stomach cancer Neg Hx    Breast cancer Neg Hx    BRCA 1/2 Neg Hx    PE: BP 120/70   Pulse 63   Ht 5' 2.5 (1.588 m)   Wt 198 lb 3.2 oz (89.9 kg)   LMP 08/23/2006   SpO2 98%   BMI 35.67 kg/m  Wt Readings from Last 15 Encounters:  12/23/24 198 lb 3.2 oz (89.9 kg)  12/02/24 195 lb (88.5 kg)  10/30/24 195 lb (88.5 kg)  10/03/24 197 lb (89.4 kg)  09/19/24 202 lb (91.6 kg)  08/27/24 199 lb (90.3 kg)  05/08/24 204 lb (92.5 kg)  04/29/24 204 lb 4 oz (92.6 kg)  01/09/24 196 lb 8 oz (89.1 kg)  01/03/24 197 lb 6.4 oz (89.5 kg)  12/22/23 200 lb 3.2 oz (90.8 kg)  03/27/23 186 lb 4 oz (84.5 kg)  02/13/23 184 lb (83.5 kg)  02/02/23 188 lb (85.3 kg)  01/19/23 190 lb (86.2 kg)   Constitutional: overweight, in NAD Eyes: EOMI, no exophthalmos, no lid lag, no stare ENT: + R thyromegaly, no cervical lymphadenopathy Cardiovascular: RRR, No MRG, + B LE edema Respiratory: CTA B Musculoskeletal: no deformities Skin: no rashes Neurological: no tremor with outstretched hands  ASSESSMENT: 1. Thyrotoxicosis  2.  Multinodular goiter  PLAN:  1. Patient with serial low TSH but without thyrotoxic  symptoms: No unintentional weight loss, heat intolerance, hyperdefecation, palpitations, anxiety.   -She does not appear to have exogenous causes for the low TSH.  We previously discussed about possible etiologies of her thyrotoxicosis to include Graves' disease (however, TSI antibodies were undetectable), thyroiditis, or toxic multinodular goiter/toxic adenoma.  She was previously  on methimazole  low-dose, 2.5 mg daily, but we were able to stop in 12/2022 with subsequently normal TFTs.  She continues Toprol -XL 25 mg daily, use for hypertension treatment.   - She has no thyrotoxic signs or symptoms.  Before last visit she gained 16 pounds after initially losing 10.  Since then, she lost 3 pounds.  - she has no active signs of Graves' ophthalmopathy: No blurry vision, double vision, eye pain, chemosis - She continues on the beta-blocker.  She is not tachycardic or tremulous - At today's visit we will recheck her TFTs and may need to start methimazole  afterwards -  If the thyroid  test are normal, we will repeat them at next visit - RTC in 1 year  2.  Multinodular goiter - Diagnosed more than 10 years ago - No neck compression symptoms  except for occasional dysphagia with pills.  She has a history of esophageal dysmotility and hiatal hernia along with GERD, which can all contribute.  Of note, the left thyroid  nodules are rather small and unlikely to compress the esophagus - She had serial ultrasounds in the last 12 years.  In 2024, the right inferior thyroid  nodule increased in size from 2.0 to 2.7 cm in the largest dimension.  We discussed that this could have been related to accumulation of colloid.  This nodule was biopsied in 01/2023 with benign results.  Of note, the right dominant nodule was biopsied in 2013 with benign results.  The left superior thyroid  nodule met criteria for 1 year follow-up.  In 12/2023 we checked another thyroid  ultrasound which showed stability of her nodules. - At today's  visit, she tells me that her mother does actually have a history of thyroid  cancer.  We discussed that more than 2 family members with thyroid  cancer would point towards a possible genetic syndrome. - I would suggest a repeat thyroid  ultrasound in another 2 years unless she develops neck compression symptoms  Component     Latest Ref Rng 12/23/2024  TSH     0.40 - 4.50 mIU/L 1.27   T4,Free(Direct)     0.8 - 1.8 ng/dL 1.2   Triiodothyronine,Free,Serum     2.3 - 4.2 pg/mL 3.1   Normal TFTs.  No need to restart methimazole .  Lela Fendt, MD PhD [facility]

## 2024-12-24 ENCOUNTER — Ambulatory Visit: Payer: Self-pay | Admitting: Internal Medicine

## 2024-12-24 LAB — T4, FREE: Free T4: 1.2 ng/dL (ref 0.8–1.8)

## 2024-12-24 LAB — T3, FREE: T3, Free: 3.1 pg/mL (ref 2.3–4.2)

## 2024-12-24 LAB — TSH: TSH: 1.27 m[IU]/L (ref 0.40–4.50)

## 2024-12-26 ENCOUNTER — Ambulatory Visit: Admitting: Family Medicine

## 2025-01-01 ENCOUNTER — Ambulatory Visit (INDEPENDENT_AMBULATORY_CARE_PROVIDER_SITE_OTHER): Admitting: Family Medicine

## 2025-01-01 ENCOUNTER — Encounter: Payer: Self-pay | Admitting: Family Medicine

## 2025-01-01 VITALS — BP 135/72 | HR 54 | Temp 98.6°F | Ht 62.5 in | Wt 192.0 lb

## 2025-01-01 DIAGNOSIS — E66811 Obesity, class 1: Secondary | ICD-10-CM

## 2025-01-01 DIAGNOSIS — I10 Essential (primary) hypertension: Secondary | ICD-10-CM | POA: Diagnosis not present

## 2025-01-01 DIAGNOSIS — E538 Deficiency of other specified B group vitamins: Secondary | ICD-10-CM | POA: Diagnosis not present

## 2025-01-01 DIAGNOSIS — Z6834 Body mass index (BMI) 34.0-34.9, adult: Secondary | ICD-10-CM

## 2025-01-01 NOTE — Progress Notes (Signed)
 "  Office: [phone]  /  Fax: [phone]  WEIGHT SUMMARY AND BIOMETRICS  Starting Date: 09/19/24  Starting Weight: 202lb   Weight Lost Since Last Visit: 3lb   Vitals Temp: 98.6 F (37 C) BP: 135/72 Pulse Rate: (!) 54 SpO2: 99 %   Body Composition  Body Fat %: 44.9 % Fat Mass (lbs): 86.4 lbs Muscle Mass (lbs): 100.6 lbs Total Body Water  (lbs): 73.6 lbs Visceral Fat Rating : 13    HPI  Chief Complaint: OBESITY  Cynthia Berger is here to discuss her progress with her obesity treatment plan. She is on the the Category 2 Plan and states she is following her eating plan approximately 15 % of the time. She states she is exercising 30 minutes 2 times per week.  Interval History:  Since last office visit she is down 3 lb She has been more consistent with walking/ workouts -- 3 days/ wk She has seen a reduction in sugar cravings (husband buys them) She has net weight loss of 10 lb in the past 3 mos of medically supervised weight management This is a 5% TBW loss She is cooking at home more and is more mindful of portion sizes She has tried a protein shake in place of skipping breakfast She plans to do more walking at lunch w/ her daughter who works from home.  Pharmacotherapy: none  PHYSICAL EXAM:  Blood pressure 135/72, pulse (!) 54, temperature 98.6 F (37 C), height 5' 2.5 (1.588 m), weight 192 lb (87.1 kg), last menstrual period 08/23/2006, SpO2 99%. Body mass index is 34.56 kg/m.  General: She is overweight, cooperative, alert, well developed, and in no acute distress. PSYCH: Has normal mood, affect and thought process.   Lungs: Normal breathing effort, no conversational dyspnea.   ASSESSMENT AND PLAN  TREATMENT PLAN FOR OBESITY:  Recommended Dietary Goals  Cynthia Berger is currently in the action stage of change. As such, her goal is to continue weight management plan. She has agreed to the Category 2 Plan.  Behavioral Intervention  We discussed the following  Behavioral Modification Strategies today: increasing lean protein intake to established goals, increasing fiber rich foods, increasing water  intake , work on meal planning and preparation, keeping healthy foods at home, avoiding temptations and identifying enticing environmental cues, continue to work on implementation of reduced calorie nutritional plan, planning for success, and avoid all-or-none thinking.  Additional resources provided today: NA  Recommended Physical Activity Goals  Cynthia Berger has been advised to work up to 150 minutes of moderate intensity aerobic activity a week and strengthening exercises 2-3 times per week for cardiovascular health, weight loss maintenance and preservation of muscle mass.   She has agreed to [employer] strengthening exercises with a goal of 2-3 sessions a week  and Increase the intensity, frequency or duration of aerobic exercises    Pharmacotherapy changes for the treatment of obesity: none  ASSOCIATED CONDITIONS ADDRESSED TODAY  Benign essential hypertension Blood pressure has improved.  Continue amlodipine  5 mg daily and Toprol -XL 25 mg once daily.  Avoid high sodium foods.  Blood pressure did improve with reducing frequency of meals out.  Class 1 obesity due to excess calories with serious comorbidity and body mass index (BMI) of 34.0 to 34.9 in adult Improving.  Continue category 2 meal plan working on lean protein and fiber intake with meals, portion control at mealtime, reducing sugar and excess snacks.  She has ramped up regular exercise to include increased walking time and gym workouts.  We set a  goal for 30+ minutes 5 days a week.  Low serum vitamin B12 She is taking vitamin B12 500 mcg once daily.  Continue and repeat lab in the next 3 months.     She was informed of the importance of frequent follow up visits to maximize her success with intensive lifestyle modifications for her multiple health conditions.   ATTESTASTION  STATEMENTS:  Reviewed by clinician on day of visit: allergies, medications, problem list, medical history, surgical history, family history, social history, and previous encounter notes pertinent to obesity diagnosis.   I personally spent a total of 20 minutes in the care of the patient today including preparing to see the patient, getting/reviewing separately obtained history, performing a medically appropriate exam/evaluation, counseling and educating, and documenting clinical information in the EHR.     Cynthia Berger, D.O. DABFM, DABOM [facility] [address] [place], [place] [address] [phone] "

## 2025-01-10 ENCOUNTER — Ambulatory Visit (INDEPENDENT_AMBULATORY_CARE_PROVIDER_SITE_OTHER): Admitting: Family Medicine

## 2025-01-10 ENCOUNTER — Encounter: Payer: Self-pay | Admitting: Family Medicine

## 2025-01-10 VITALS — BP 140/80 | HR 75 | Temp 98.1°F | Resp 16 | Ht 62.0 in | Wt 197.8 lb

## 2025-01-10 DIAGNOSIS — N1831 Chronic kidney disease, stage 3a: Secondary | ICD-10-CM | POA: Diagnosis not present

## 2025-01-10 DIAGNOSIS — L03311 Cellulitis of abdominal wall: Secondary | ICD-10-CM

## 2025-01-10 DIAGNOSIS — I5020 Unspecified systolic (congestive) heart failure: Secondary | ICD-10-CM

## 2025-01-10 DIAGNOSIS — I10 Essential (primary) hypertension: Secondary | ICD-10-CM

## 2025-01-10 MED ORDER — DOXYCYCLINE HYCLATE 100 MG PO TABS: 100.0000 mg | ORAL_TABLET | Freq: Two times a day (BID) | ORAL | 0 refills | Status: AC

## 2025-01-10 NOTE — Assessment & Plan Note (Signed)
 Problem has been stable. Cr 1.1 and eGFR 49.14 04/2024. Continue adequate hydration and BP control. Low-salt diet and avoidance of NSAIDs. She is not on RAS agent or SGLT-2 inh. Will plan on rechecking BMP next visit.

## 2025-01-10 NOTE — Progress Notes (Signed)
 "  ACUTE VISIT Chief Complaint  Patient presents with   Acute Visit    Patient stated there is lump on her lower stomach - irritated x 2 weeks    Discussed the use of AI scribe software for clinical note transcription with the patient, who gave verbal consent to proceed.  History of Present Illness Cynthia Berger is a 69 year old female with PMHx significant for hypertension, hypothyroidism, multinodular goiter, CKD 3, and hyperlipidemia who presents with a lump in the lower stomach.  Approximately two weeks ago, she noticed a lump in her lower abdomen, which is not visible on the skin but can be palpated. The area is sore, especially after frequent touching. Initially, it was a small spot, but she felt it had become larger upon bathing yesterday.  No fever, chills, abnormal weight loss, or body aches.  Negative for abdominal pain, changes in bowel habits, or urinary symptoms. She has not tried OTC treatments.  She was last seen on 04/29/2024.  Since her last visit she has follow-up with her endocrinologist for multinodular goiter and hyperthyroidism.  BP mildly elevated today. Currently she is on amlodipine  5 mg daily and metoprolol  succinate 25 mg daily. Negative for headache, visual changes, CP, dyspnea, orthopnea, PND, or edema. Hx of HFrEF, she is no longer following with cardiologist.  CKD 3: She has not noted gross hematuria, foamy urine, or decreased urine output. Lab Results  Component Value Date   NA 139 04/29/2024   CL 106 04/29/2024   K 4.4 04/29/2024   CO2 25 04/29/2024   BUN 18 04/29/2024   CREATININE 1.15 04/29/2024   GFR 49.48 (L) 04/29/2024   CALCIUM 9.3 04/29/2024   ALBUMIN 4.0 04/29/2024   GLUCOSE 89 04/29/2024   She has established with healthy weight and wellness clinic to help with weight loss. She engages in indoor walking at home and is participating in a healthy weight program to improve her eating habits.  Follows with urologist regularly, every 6  months, for malignant neoplasia of right kidney, s/p right nephrectomy.  Review of Systems  Constitutional:  Negative for activity change and appetite change.  Respiratory:  Negative for cough and wheezing.   Gastrointestinal:  Negative for abdominal pain, nausea and vomiting.  Genitourinary:  Negative for dysuria, flank pain, genital sores, vaginal bleeding and vaginal discharge.  Skin:  Negative for rash and wound.  Neurological:  Negative for syncope and weakness.  See other pertinent positives and negatives in HPI.  Medications Ordered Prior to Encounter[1]  Past Medical History:  Diagnosis Date   AC (acromioclavicular) joint bone spurs    HEEL BONE SPURS-SURGERY 12/2010 LEFT FOOT   Antral gastritis    Arthritis    Cancer of kidney (HCC)    Closed fracture of fifth toe of left foot with malunion    right   DDD (degenerative disc disease), cervical    Difficult airway for intubation, initial encounter 05/28/2013   DL once by CRNA, MAC 3, no view of cords. DL twice by anesthesiologist MAC 3 no view of cords. ); Video Laryngoscope (Good view with glidescope. Cords were anterior)   Esophageal dysmotility    GERD (gastroesophageal reflux disease)    Heart palpitations    Hiatal hernia    Hypertension    Hyperthyroidism    LGSIL (low grade squamous intraepithelial dysplasia) 2013    C&B Inadequate ECC--positive LGSIL --   Multinodular goiter    RIGHT LOBE-- PER BX NEGATIVE   Positive H. pylori  test    Thyroid  nodule    Vitamin D  deficiency 11/2007   LOW AT 18   Allergies[2]  Social History   Socioeconomic History   Marital status: Married    Spouse name: Not on file   Number of children: 4   Years of education: Not on file   Highest education level: 12th grade  Occupational History   Occupation: [employer]    Employer: [employer]  Tobacco Use   Smoking status: Never   Smokeless tobacco: Never  Vaping Use   Vaping status: Never Used  Substance and Sexual  Activity   Alcohol use: Yes    Comment: occasional wine   Drug use: No   Sexual activity: Yes    Partners: Male    Birth control/protection: Post-menopausal, Surgical    Comment: Hyst, First IC >16y/o, <5 Partners  Other Topics Concern   Not on file  Social History Narrative   Not on file   Social Drivers of Health   Tobacco Use: Low Risk (01/10/2025)   Patient History    Smoking Tobacco Use: Never    Smokeless Tobacco Use: Never    Passive Exposure: Not on file  Financial Resource Strain: Low Risk (01/09/2025)   Overall Financial Resource Strain (CARDIA)    Difficulty of Paying Living Expenses: Not very hard  Food Insecurity: No Food Insecurity (01/09/2025)   Epic    Worried About Radiation Protection Practitioner of Food in the Last Year: Never true    Ran Out of Food in the Last Year: Never true  Transportation Needs: No Transportation Needs (01/09/2025)   Epic    Lack of Transportation (Medical): No    Lack of Transportation (Non-Medical): No  Physical Activity: Insufficiently Active (01/09/2025)   Exercise Vital Sign    Days of Exercise per Week: 2 days    Minutes of Exercise per Session: 20 min  Stress: No Stress Concern Present (01/09/2025)   [employer] of Occupational Health - Occupational Stress Questionnaire    Feeling of Stress: Not at all  Social Connections: Socially Integrated (01/09/2025)   Social Connection and Isolation Panel    Frequency of Communication with Friends and Family: More than three times a week    Frequency of Social Gatherings with Friends and Family: More than three times a week    Attends Religious Services: More than 4 times per year    Active Member of Clubs or Organizations: Yes    Attends Banker Meetings: 1 to 4 times per year    Marital Status: Married  Depression (PHQ2-9): Low Risk (05/08/2024)   Depression (PHQ2-9)    PHQ-2 Score: 0  Alcohol Screen: Low Risk (05/08/2024)   Alcohol Screen    Last Alcohol Screening Score (AUDIT): 0   Housing: Low Risk (01/09/2025)   Epic    Unable to Pay for Housing in the Last Year: No    Number of Times Moved in the Last Year: 0    Homeless in the Last Year: No  Utilities: Not At Risk (05/08/2024)   AHC Utilities    Threatened with loss of utilities: No  Health Literacy: Adequate Health Literacy (05/08/2024)   B1300 Health Literacy    Frequency of need for help with medical instructions: Never    Vitals:   01/10/25 0727 01/10/25 0750  BP: (!) 140/88 (!) 140/80  Pulse: 75   Resp: 16   Temp: 98.1 F (36.7 C)   SpO2: 97%    Body mass  index is 36.18 kg/m.  Physical Exam Vitals and nursing note reviewed.  Constitutional:      General: She is not in acute distress.    Appearance: She is well-developed.  HENT:     Head: Normocephalic and atraumatic.  Eyes:     Conjunctiva/sclera: Conjunctivae normal.  Pulmonary:     Effort: Pulmonary effort is normal. No respiratory distress.     Breath sounds: Normal breath sounds.  Abdominal:     Palpations: Abdomen is soft. There is no hepatomegaly or mass.     Tenderness: There is no abdominal tenderness.   Lymphadenopathy:     Cervical: No cervical adenopathy.  Skin:    General: Skin is warm.     Findings: Rash present.  Neurological:     Mental Status: She is alert and oriented to person, place, and time.  Psychiatric:        Mood and Affect: Mood and affect normal.    ASSESSMENT AND PLAN:  Ms. Cynthia Berger was seen today for acute visit, tender area in lower abdomen.  Diagnoses and all orders for this visit:  Abdominal wall cellulitis Area of concern with mild induration,right above C-section scar, I cannot clearly identify a mass, mildly tender and slight erythema. We discussed possible etiologies, could be a mild abdominal wall cellulitis. She agrees with starting antibiotic therapy, doxycycline 100 mg twice daily for 7 days.  We discussed some side effects. Monitor for new symptoms. Instructed about warning  signs.  -     Doxycycline Hyclate; Take 1 tablet (100 mg total) by mouth 2 (two) times daily for 7 days.  Dispense: 14 tablet; Refill: 0  Benign essential hypertension Assessment & Plan: BP mildly elevated today. She is reporting SBPs at home in the 120s. Continue amlodipine  5 mg daily and metoprolol  succinate 25 mg daily as well as low-salt diet. Continue monitoring BP at home regularly.  Stage 3a chronic kidney disease (HCC) Assessment & Plan: Problem has been stable. Cr 1.1 and eGFR 49.14 04/2024. Continue adequate hydration and BP control. Low-salt diet and avoidance of NSAIDs. She is not on RAS agent or SGLT-2 inh. Will plan on rechecking BMP next visit.   HFrEF (heart failure with reduced ejection fraction) (HCC) Assessment & Plan: She is no longer following with cardiologist, euvolemic. Currently she is on metoprolol  succinate 25 mg daily. LVEF mildly decreased in 03/2012. MUGA study 05/2012 with LVEF 68%.  Return if symptoms worsen or fail to improve, for keep next appointment.  Karin Griffith G. Luisfelipe Engelstad, MD  [facility]. [place] office.     [1]  Current Outpatient Medications on File Prior to Visit  Medication Sig Dispense Refill   amLODipine  (NORVASC ) 5 MG tablet TAKE 1 TABLET BY MOUTH EVERY DAY 90 tablet 3   cholecalciferol (VITAMIN D ) 1000 UNITS tablet Take 1,000 Units by mouth daily.     Cyanocobalamin (B-12) 500 MCG TABS Take 500 mcg by mouth daily. 90 tablet 0   diclofenac  Sodium (VOLTAREN  ARTHRITIS PAIN) 1 % GEL Apply 2 g topically 4 (four) times daily. 150 g 1   famotidine  (PEPCID ) 20 MG tablet Take 1 tablet (20 mg total) by mouth 2 (two) times daily. 180 tablet 0   metoprolol  succinate (TOPROL -XL) 25 MG 24 hr tablet Take 1 tablet (25 mg total) by mouth daily. 90 tablet 3   Multiple Vitamins-Minerals (MULTIVITAMIN PO) Take 1 tablet by mouth daily.      No current facility-administered medications on file prior to visit.  [2]  Allergies Allergen  Reactions   Penicillins Rash   "

## 2025-01-10 NOTE — Patient Instructions (Addendum)
 A few things to remember from today's visit:  Abdominal wall cellulitis - Plan: doxycycline  (VIBRA -TABS) 100 MG tablet  Benign essential hypertension  Stage 3a chronic kidney disease (HCC)  Possible cellulitis, not an abscess at this time. Warm compressions may help. Take antibiotic with food. Monitor for changes.  Continue monitoring blood pressures at home.  If you need refills for medications you take chronically, please call your pharmacy. Do not use My Chart to request refills or for acute issues that need immediate attention. If you send a my chart message, it may take a few days to be addressed, specially if I am not in the office.  Please be sure medication list is accurate. If a new problem present, please set up appointment sooner than planned today.

## 2025-01-10 NOTE — Assessment & Plan Note (Signed)
 BP mildly elevated today. She is reporting SBPs at home in the 120s. Continue amlodipine  5 mg daily and metoprolol  succinate 25 mg daily as well as low-salt diet. Continue monitoring BP at home regularly.

## 2025-01-10 NOTE — Assessment & Plan Note (Addendum)
 She is no longer following with cardiologist, euvolemic. Currently she is on metoprolol  succinate 25 mg daily. LVEF mildly decreased in 03/2012. MUGA study 05/2012 with LVEF 68%.

## 2025-01-30 ENCOUNTER — Ambulatory Visit: Admitting: Family Medicine

## 2025-02-12 ENCOUNTER — Encounter: Admitting: Nurse Practitioner

## 2025-05-16 ENCOUNTER — Ambulatory Visit

## 2025-12-22 ENCOUNTER — Ambulatory Visit: Admitting: Internal Medicine
